# Patient Record
Sex: Male | Born: 1935 | Race: White | Hispanic: No | Marital: Married | Smoking: Former smoker
Health system: Southern US, Community
[De-identification: ages and names within clinical notes are randomized; demographics above are authoritative.]

## PROBLEM LIST (undated history)

## (undated) DIAGNOSIS — I2510 Atherosclerotic heart disease of native coronary artery without angina pectoris: Secondary | ICD-10-CM

## (undated) DIAGNOSIS — J4489 Other specified chronic obstructive pulmonary disease: Secondary | ICD-10-CM

## (undated) DIAGNOSIS — N200 Calculus of kidney: Secondary | ICD-10-CM

## (undated) DIAGNOSIS — R259 Unspecified abnormal involuntary movements: Secondary | ICD-10-CM

## (undated) DIAGNOSIS — J189 Pneumonia, unspecified organism: Secondary | ICD-10-CM

## (undated) DIAGNOSIS — J449 Chronic obstructive pulmonary disease, unspecified: Secondary | ICD-10-CM

## (undated) DIAGNOSIS — I872 Venous insufficiency (chronic) (peripheral): Secondary | ICD-10-CM

## (undated) DIAGNOSIS — G4733 Obstructive sleep apnea (adult) (pediatric): Secondary | ICD-10-CM

## (undated) HISTORY — DX: Calculus of kidney: N20.0

## (undated) HISTORY — DX: Unspecified abnormal involuntary movements: R25.9

## (undated) HISTORY — DX: Other specified chronic obstructive pulmonary disease: J44.89

## (undated) HISTORY — DX: Atherosclerotic heart disease of native coronary artery without angina pectoris: I25.10

## (undated) HISTORY — DX: Venous insufficiency (chronic) (peripheral): I87.2

## (undated) HISTORY — DX: Obstructive sleep apnea (adult) (pediatric): G47.33

## (undated) HISTORY — DX: Chronic obstructive pulmonary disease, unspecified: J44.9

---

## 2002-08-21 HISTORY — PX: CORONARY ARTERY BYPASS GRAFT: SHX141

## 2003-06-25 ENCOUNTER — Inpatient Hospital Stay (HOSPITAL_COMMUNITY): Admission: AD | Admit: 2003-06-25 | Discharge: 2003-07-06 | Payer: Self-pay | Admitting: Cardiology

## 2003-08-05 ENCOUNTER — Encounter: Admission: RE | Admit: 2003-08-05 | Discharge: 2003-08-05 | Payer: Self-pay | Admitting: Cardiothoracic Surgery

## 2005-02-23 ENCOUNTER — Ambulatory Visit: Payer: Self-pay | Admitting: Cardiology

## 2005-03-01 ENCOUNTER — Ambulatory Visit: Payer: Self-pay

## 2005-03-08 ENCOUNTER — Ambulatory Visit (HOSPITAL_COMMUNITY): Admission: RE | Admit: 2005-03-08 | Discharge: 2005-03-08 | Payer: Self-pay | Admitting: Neurology

## 2006-05-11 ENCOUNTER — Ambulatory Visit: Payer: Self-pay | Admitting: Cardiology

## 2007-05-01 ENCOUNTER — Ambulatory Visit: Payer: Self-pay | Admitting: Cardiology

## 2008-02-16 ENCOUNTER — Encounter: Admission: RE | Admit: 2008-02-16 | Discharge: 2008-02-16 | Payer: Self-pay | Admitting: Sports Medicine

## 2008-04-21 ENCOUNTER — Ambulatory Visit: Payer: Self-pay | Admitting: Cardiology

## 2009-05-06 DIAGNOSIS — G4733 Obstructive sleep apnea (adult) (pediatric): Secondary | ICD-10-CM | POA: Insufficient documentation

## 2009-05-06 DIAGNOSIS — Z87442 Personal history of urinary calculi: Secondary | ICD-10-CM | POA: Insufficient documentation

## 2009-05-06 DIAGNOSIS — Z951 Presence of aortocoronary bypass graft: Secondary | ICD-10-CM | POA: Insufficient documentation

## 2009-05-06 DIAGNOSIS — J449 Chronic obstructive pulmonary disease, unspecified: Secondary | ICD-10-CM | POA: Insufficient documentation

## 2009-05-06 DIAGNOSIS — J441 Chronic obstructive pulmonary disease with (acute) exacerbation: Secondary | ICD-10-CM | POA: Insufficient documentation

## 2009-05-06 DIAGNOSIS — E785 Hyperlipidemia, unspecified: Secondary | ICD-10-CM

## 2009-05-06 DIAGNOSIS — I872 Venous insufficiency (chronic) (peripheral): Secondary | ICD-10-CM | POA: Insufficient documentation

## 2009-05-06 DIAGNOSIS — I2510 Atherosclerotic heart disease of native coronary artery without angina pectoris: Secondary | ICD-10-CM | POA: Insufficient documentation

## 2009-05-07 ENCOUNTER — Ambulatory Visit: Payer: Self-pay | Admitting: Cardiology

## 2009-06-24 ENCOUNTER — Encounter: Payer: Self-pay | Admitting: Cardiology

## 2009-12-23 ENCOUNTER — Encounter: Payer: Self-pay | Admitting: Cardiology

## 2010-05-11 ENCOUNTER — Ambulatory Visit: Payer: Self-pay | Admitting: Cardiology

## 2010-06-28 ENCOUNTER — Encounter: Payer: Self-pay | Admitting: Cardiology

## 2010-09-20 NOTE — Assessment & Plan Note (Signed)
Summary: f1y  Medications Added POTASSIUM CHLORIDE CR 10 MEQ CR-CAPS (POTASSIUM CHLORIDE) Take one tablet by mouth daily FUROSEMIDE 20 MG TABS (FUROSEMIDE) Take one tablet by mouth daily. as needed PROPRANOLOL HCL 20 MG TABS (PROPRANOLOL HCL) Take 1 tablet by mouth three times a day      Allergies Added: NKDA  Visit Type:  1 year follow up Primary Provider:  Foye Deer, MD  CC:  No cardiac complains.  History of Present Illness: Mr. zilka is 77 years old and return for management of CAD. In 2004 he had bypass surgery. He has done quite well since then. He goes to the [facility] 4 days a week and works out. He's had no chest pain shortness of breath or palpitations.  His other problems include COPD, obstructive sleep apnea, hyperlipidemia, and venous insufficiency.  Current Medications (verified): 1)  Synthroid 125 Mcg Tabs (Levothyroxine Sodium) .... Take One Tablet By Mouth Once Daily. 2)  Simvastatin 80 Mg Tabs (Simvastatin) .... Take One Tablet By Mouth Daily At Bedtime 3)  Potassium Chloride Cr 10 Meq Cr-Caps (Potassium Chloride) .... Take One Tablet By Mouth Daily 4)  Furosemide 20 Mg Tabs (Furosemide) .... Take One Tablet By Mouth Daily. As Needed 5)  Propranolol Hcl 20 Mg Tabs (Propranolol Hcl) .... Take 1 Tablet By Mouth Three Times A Day 6)  Fa-Vitamin B-6-Vitamin B-12 2.2-25-0.5 Mg Tabs (Folic Acid-Vit B6-Vit B12) .... Take One Tablet By Mouth Once Daily. 7)  Aspirin Ec 325 Mg Tbec (Aspirin) .... Take One Tablet By Mouth Daily 8)  Meloxicam 15 Mg Tabs (Meloxicam) .... As Needed 9)  Lumigan 0.03 % Soln (Bimatoprost) .... Once Daily  Allergies (verified): No Known Drug Allergies  Past History:  Past Medical History: Reviewed history from 05/06/2009 and no changes required. NEPHROLITHIASIS, HX OF (ICD-V13.01) VENOUS INSUFFICIENCY (ICD-459.81) TREMOR.CHRONIC (ICD-781.0) HYPERLIPIDEMIA (ICD-272.4) COPD (ICD-496) SLEEP APNEA, OBSTRUCTIVE (ICD-327.23) CORONARY ARTERY  DISEASE (ICD-414.00) 1. Coronary artery disease status post coronary artery bypass graft     surgery in 2004 2. Normal left ventricular function. 3. Obstructive sleep apnea. 4. Chronic obstructive pulmonary disease. 5. Hyperlipidemia. 6. Chronic tremor. 7. Venous insufficiency of the lower extremities.    Review of Systems       ROS is negative except as outlined in HPI.   Vital Signs:  Patient profile:   77 year old male Height:      66 inches Weight:      219.50 pounds BMI:     35.56 Pulse rate:   63 / minute Pulse rhythm:   regular Resp:     18 per minute BP sitting:   132 / 80  (left arm) Cuff size:   large  Vitals Entered By: Vikki Ports (May 11, 2010 11:26 AM)  Physical Exam  Additional Exam:  Gen. Well-nourished, in no distress   Neck: No JVD, thyroid not enlarged, no carotid bruits Lungs: No tachypnea, clear without rales, rhonchi or wheezes Cardiovascular: Rhythm regular, PMI not displaced,  heart sounds  normal, no murmurs or gallops, no peripheral edema, pulses normal in all 4 extremities. Abdomen: BS normal, abdomen soft and non-tender without masses or organomegaly, no hepatosplenomegaly. MS: No deformities, no cyanosis or clubbing   Neuro:  No focal sns   Skin:  no lesions    Impression & Recommendations:  Problem # 1:  CORONARY ARTERY BYPASS GRAFT, HX OF (ICD-V45.81) He has had previous bypass surgery. He has had no chest pain this problem appears stable. His ECG today showed sinus  rhythm and lateral T-wave changes.  Problem # 2:  HYPERLIPIDEMIA (ICD-272.4)  His updated medication list for this problem includes:    Simvastatin 80 Mg Tabs (Simvastatin) .Marland Kitchen... Take one tablet by mouth daily at bedtime  Problem # 3:  COPD (ICD-496) This problem appears stable.  Patient Instructions: 1)  Your physician recommends that you continue on your current medications as directed. Please refer to the Current Medication list given to you today. 2)  Your  physician wants you to follow-up in: 1 year Dr. Clifton James.  You will receive a reminder letter in the mail two months in advance. If you don't receive a letter, please call our office to schedule the follow-up appointment.

## 2011-01-03 NOTE — Assessment & Plan Note (Signed)
[facility]                            CARDIOLOGY OFFICE NOTE   Billy Smith, Billy Smith                        MRN:          [id number]  DATE:05/01/2007                            DOB:          06/02/1933    PRIMARY CARE PHYSICIAN:  Dr. Barney Drain, [place], [place].   CLINICAL HISTORY:  Billy Smith is 77 years old and had 3-vessel coronary  artery bypass graft surgery in 2004.  He has done well since that time.  He is working out at [employer] most every day with rowing machines and  other aerobic activities.  He says he has had no chest pain,  palpitations, or shortness of breath.   PAST MEDICAL HISTORY:  1. Chronic tremor for which he is treated with propranolol.  2. Obstructive sleep apnea.  3. Chronic obstructive pulmonary disease.  4. History of venous insufficiency of lower extremities.   CURRENT MEDICATIONS:  Zocor, furosemide, potassium, Synthroid,  propranolol, aspirin, and primidone.   PHYSICAL EXAMINATION:  VITAL SIGNS:  Blood pressure 136/82, pulse 71 and  regular.  NECK:  There was no vein distension.  The carotid pulses were full  without bruits.  CHEST:  Clear.  CARDIAC:  Rhythm was regular.  No murmurs or gallops.  ABDOMEN:  Soft.  No organomegaly.  EXTREMITIES:  Peripheral pulses were full, and there was trace  peripheral edema.   An electrocardiogram showed lateral T wave changes which have not  changed.   IMPRESSION:  1. Coronary artery disease status post coronary artery bypass graft      surgery in 2004, stable.  2. Normal left ventricular function.  3. Obstructive sleep apnea.  4. Chronic obstructive pulmonary disease.  5. Hyperlipidemia.  6. Chronic tremor.  7. Venous insufficiency of the lower extremities.   RECOMMENDATIONS:  I think Billy Smith is doing well.  He would like to cut  back on his Lasix, and I think we can do this.  We will cut him back  from 80 to 40, and I told him that he could take it after he  [facility]es to the  [facility] if that interrupts his [facility] activities.  We will plan to see him back in  followup in a year.   ADDENDUM:  His wife asked about peripheral vascular disease, and I had  difficulty feeling Billy Smith pulses today and will check Billy Smith chart and decide  if we should consider anything more.     Bruce Elvera Lennox Juanda Chance, MD, [facility]  Electronically Signed    BRB/MedQ  DD: 05/01/2007  DT: 05/02/2007  Job #: [id number]   cc:   Barney Drain, M.D.

## 2011-01-03 NOTE — Assessment & Plan Note (Signed)
[facility]                            CARDIOLOGY OFFICE NOTE   Billy, Smith                        MRN:          [id number]  DATE:04/21/2008                            DOB:          [birth year]    PRIMARY CARE PHYSICIAN:  Billy Deer, MD   CLINICAL HISTORY:  Billy Smith is 77 years old and he returned for  followup management of his coronary heart disease.  He had coronary  artery bypass surgery in 2004.  He has done quite well since that time  and has had no recent chest pain, shortness of breath, or palpitations.  He works out at [employer] frequently, although he has had some lumbar  disk problems, which have slowed him up recently.   His past medical history is significant for chronic tremor, obstructive  sleep apnea, COPD, and venous insufficiency of the lower extremities.   Current medications include simvastatin, potassium, Synthroid,  propranolol, primidone, aspirin, and furosemide 20 mg b.i.d.   On examination, blood pressure 121/73 and pulse 63 and regular.  There  was no venous distention.  The carotid pulses were full without bruits.  Chest was clear.  Cardiac rhythm was regular.  There were no murmurs,  rubs, or gallops.  The abdomen was soft with normal bowel sounds.  Peripheral pulses were full with trace peripheral edema.   Electrocardiogram showed nonspecific ST-T changes.  There is borderline  left axis deviation.   IMPRESSION:  1. Coronary artery disease status post coronary artery bypass graft      surgery in 2004, stable.  2. Normal left ventricular function.  3. Obstructive sleep apnea.  4. Chronic obstructive pulmonary disease.  5. Hyperlipidemia.  6. Chronic tremor.  7. Venous insufficiency of the lower extremities.   RECOMMENDATIONS:  I think Billy Smith is doing quite well.  He has lost 23  pounds over the last couple of years and he has targeted 200 pounds.  I  encouraged him to continue with that.  He is  scheduled to have labs with  Dr. Tomasa Smith in the near future, and I have asked him to send  me a copy.  We will make not any changes in his therapy and we will plan  to see him back in followup in a year.     Bruce Elvera Lennox Juanda Chance, MD, [facility]  Electronically Signed    BRB/MedQ  DD: 04/21/2008  DT: 04/22/2008  Job #: [id number]   cc:   Billy Deer, MD

## 2011-01-06 NOTE — Assessment & Plan Note (Signed)
[facility]                              CARDIOLOGY OFFICE NOTE   Billy, Smith                        MRN:          [id number]  DATE:05/11/2006                            DOB:          [birth year]    PRIMARY CARE PHYSICIAN:  Dr. Barney Smith in [place].   CLINICAL HISTORY:  Mr. Billy Smith is 77 years old and had bypass surgery for 3  vessel coronary disease in 2004.  He had somewhat of a slow recovery due to  some comorbid conditions but subsequently has done quite well. He says he  has had no chest pain, shortness of breath or palpitations.  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  His family says he is somewhat reluctant to exert  himself for long walks.  His daughter also said that he sweats frequently  with daily activities.   His past medical history is significant for chronic tremor for which he is  being treated with propranolol.  He also has obstructive sleep apnea and  chronic obstructive pulmonary disease.  He also has venous insufficiency in  the lower extremities.   REVIEW OF SYSTEMS:  Positive in that he has had continued pain at the venous  harvest sites in his left leg.   CURRENT MEDICATIONS:  Include:  Zocor, furosemide, potassium, Synthroid,  propranolol, primidone.   On examination the blood pressure was 138/80 and the pulse 63 and regular.  There was no venous distention.  The carotid pulses were full without  bruits.  CHEST:  Clear.  CARDIAC:  Rhythm was regular.  He had no murmurs or gallops.  ABDOMEN:  Soft. No organomegaly  Bowel sounds were normal.  Peripheral pulses were full.  There is trace edema bilaterally.   An electrocardiogram showed incomplete right bundle branch block and  nonspecific ST-T changes with some slight lateral T-wave inversions.   IMPRESSION:  1. Coronary artery disease status post coronary bypass graft in 2004,  stable.  2. Obstructive sleep apnea.  3. Hyperlipidemia.  4. Chronic tremor.  5. Diaphoretic spells, uncertain etiology.   RECOMMENDATIONS:  I think Mr. Billy Smith is doing quite well from the standpoint  of his heart.  He is scheduled to see Dr. Tomasa Smith next week who plans to  check his cholesterol and thyroid. His thyroid functions will be of concern  because the recent sweating spells.  I asked the Billy Smith  to ask Dr. Tomasa Smith to send me copies of both of those results.  I did not  make any changes in his medications.  We will see him back in followup in a  year.                               Bruce Elvera Lennox Juanda Chance, MD, [facility]    BRB/MedQ  DD:  05/11/2006  DT:  05/14/2006  Job #:  [id number]

## 2011-01-06 NOTE — H&P (Signed)
NAME:  Billy Smith, Billy Smith                           ACCOUNT NO.:  [id number]   MEDICAL RECORD NO.:  [id number]                   PATIENT TYPE:  OIB   LOCATION:  2869                                 FACILITY:  [facility]   PHYSICIAN:  Charlies Constable, M.D.                  DATE OF BIRTH:  1933-08-05   DATE OF ADMISSION:  06/23/2003  DATE OF DISCHARGE:                                HISTORY & PHYSICAL   CHIEF COMPLAINT:  Dyspnea on exertion and abnormal Cardiolite.   HISTORY OF PRESENT ILLNESS:  Mr. Fluke is a 77 year old male with no known  history of coronary artery disease.  He was diagnosed with congestive heart  failure by his primary care physician, Dr. Tomasa Blase in [place], [place].  Because of this he had a Cardiolite scheduled and this showed EKG  changes and shortness of breath.  The Cardiolite images showed septal and  anterior ischemia with an EF of 59%.  Mr. Beaumier was refereed for  catheterization.   Mr. Ferrick never gets chest pain. He is not generally [place] of breath at  rest, but has significant dyspnea on exertion that has been getting worse  according to he and his wife.  He does continue to go to the [facility] at times  and when he is there he will work on a rowing machine, and a stationery  bicycle but does not actually walk very much because he gets shortness of  breath.  Of note, Mr. Loving is also undergoing neurologic evaluation for a  tremor and his wife states that she feels he has had some personality  changes as well.   PAST MEDICAL HISTORY:  Past medical history is significant for possible  hypertension, history of dyslipidemia and obesity.  He is a smoker, quit  recently.  He has a history of hypothyroidism and sleep apnea.  He also has  a history of nephrolithiasis.  He has a recently diagnosed tremor.   PAST SURGICAL HISTORY:  None.   SOCIAL HISTORY:  [place]  He has  an approximately 50-pack-year history of tobacco  use but quit about 4 years  ago.  He does not abuse alcohol.  He does not use any drugs.   FAMILY HISTORY:  His mother is alive at age 69, but had an MI earlier in  life.  His father died at age 66 of prostate cancer.  One brother has  possible coronary artery disease, but this is not known.   ALLERGIES:  No known drug allergies.   MEDICATIONS:  1. Synthroid 75 mg daily.  2. Lasix 40 mg daily.  3. Klorcon 20 mEq daily.  4. Aspirin 81 mg daily.  5. Primidone 250 mg b.i.d.   REVIEW OF SYSTEMS:  Significant for obstructive sleep apnea for which he  uses CPAP and he gets some relief from that.  He denies any hematemesis,  hemoptysis, or melena.  He has not had any significant illness recently and  denies any fevers or chills.  There is no history of seizures and no syncope  or presyncope. He denies claudication symptoms.   PHYSICAL EXAMINATION:  VITAL SIGNS:  Temperature is 97.3, blood pressure  139/76, heart rate 79, respiratory rate 20.  GENERAL:  He is a well-developed, obese, white male in no acute distress.  HEENT:  His head is normocephalic and atraumatic with pupils are equal,  round, and reacted to light and accommodation.  Extraocular movements are  intact.  Nares are without discharge.  NECK:  There is no JVD noted.  No thyromegaly noted.  No carotid bruits are  appreciated.  CHEST:  Clear to auscultation bilaterally.  CARDIOVASCULAR:  His heart is regular in rate and rhythm with a 2/6 systolic  ejection murmur noted.  ABDOMEN:  Abdomen is obese and firm, but nontender, but no  hepatosplenomegaly is appreciated by percussion.  EXTREMITIES:  Without significant edema.  Distal pulses are 2+ bilaterally  in all 4 extremities.  No femoral bruits are appreciated.  NEUROLOGIC:  He is alert and oriented with no focal deficits noted.   EKG:  Sinus rhythm with T wave changes in leads 1, AVL, as well as V2  through V6.  No old exam available.   LABORATORY VALUES:  Hemoglobin  16.1, hematocrit 45.7, WBC 6.9, platelets  171,000.  Sodium 139, potassium 5.2, chloride 102, CO2 24, BUN 14,  creatinine 0.9, glucose 83.  INR 1.0 with a PTT of 33.  TSH 0.034.   Chest x-ray no significant acute abnormality.  Cardiolite:  Standard Bruce  protocol was stopped at 1-1/2 minutes into stage 2 because of dyspnea and  fatigue.  __________ images showed an EF of 59% with septal and anterior  wall ischemia and normal left ventricular wall motion.   ASSESSMENT AND PLAN:  1. Abnormal Cardiolite/dyspnea on exertion, catheterization today.  2. Other medical problems including hypothyroidism, obstructive sleep apnea     and tremors will be followed by his primary cardiologist, primary care     physician and urologist in [place], [place].      Lavella Hammock, P.A. [facility]                  Charlies Constable, M.D.    RG/MEDQ  D:  06/23/2003  T:  06/23/2003  Job:  [id number]   cc:   Aundra Dubin. Revankar, M.D.  [address]  [place]  [place] [address]  Fax: [phone]   Tomasa Blase, M.S.  [place], [place]

## 2011-01-06 NOTE — Op Note (Signed)
NAME:  Billy Smith, Billy Smith                           ACCOUNT NO.:  [id number]   MEDICAL RECORD NO.:  [id number]                   PATIENT TYPE:  INP   LOCATION:  2310                                 FACILITY:  [facility]   PHYSICIAN:  Kerin Perna III, M.D.           DATE OF BIRTH:  02-14-1933   DATE OF PROCEDURE:  06/25/2003  DATE OF DISCHARGE:                                 OPERATIVE REPORT   PREOPERATIVE DIAGNOSIS:  Class 4 progressive angina with severe left main  and three-vessel coronary artery disease.   POSTOPERATIVE DIAGNOSIS:  Class 4 progressive angina with severe left main  and three-vessel coronary artery disease.   OPERATION PERFORMED:  Coronary artery bypass grafting times four (left  internal mammary artery to left anterior descending, saphenous vein graft to  intermediate, saphenous vein graft to circumflex marginal, saphenous vein  graft to posterior descending).   SURGEON:  Kerin Perna, M.D.   ASSISTANT:  1. Salvatore Decent. Dorris Fetch, M.D.  2. Rowe Clack, P.A.-C.   ANESTHESIA:  General.   ANESTHESIOLOGIST:  Maren Beach, M.D.   INDICATIONS FOR PROCEDURE:  The patient was a 77 year old obese male with  dyspnea on exertion.  A stress test was performed in [place] which was  positive for anterior ischemia.  Cardiac catheterization by Dr. Charlies Constable  demonstrated a chronically occluded LAD, 70% left main stenosis, 70 to 80%  stenosis of the midright coronary, and faint collateralization of the distal  LAD.  Ejection fraction was 50 to 55%.  He was felt to be a candidate for  surgical coronary revascularization.  His other medical problems included  obesity, sleep apnea, chronic smoking history, and tremor disorder.   Prior to surgery I examined the patient in his hospital room and reviewed  the results of the cardiac catheterization with the patient and his family.  I discussed the indications and expected benefits of coronary artery bypass  surgery for  treatment of his coronary artery disease.  I discussed the  alternatives to surgical therapy for treatment of his coronary artery  disease and the expected outcome of those alternative therapies.  I reviewed  with the patient and family, the major aspects of the proposed procedure  including the choice of conduit for grafting, the location of the surgical  incisions, the use of general anesthesia and cardiopulmonary bypass, and the  expected postoperative hospital recovery.  I discussed with the patient  risks to him of coronary artery bypass surgery including risks of MI, CVA,  bleeding, blood transfusion requirement, infection, and death.  He  understood that due to his chronic lung disease, he would be at increase for  postoperative pulmonary problems including pneumonia, atelectasis, oxygen  dependence, and ventilator dependence. He understood these implications for  the surgery and agreed to proceed with the operation as planned under what I  felt was an informed consent.   OPERATIVE FINDINGS:  The patient's body habitus make exposure of the back of  the heart difficult.  His aorta was foreshortened.  The vein was harvested  from the right leg using endoscopic technique for the thigh as well as the  upper part of the distal leg.  The lower leg vein on the right was not  adequate for use and a separate piece was taken from the left ankle.  The  mammary artery was a small vessel but with excellent flow.  Coronaries were  all small and other than the posterior descending they were suboptimal  targets for grafting.  The circumflex marginal was intramyocardial.  The  patient was given a unit of platelets following administration of protamine  for evidence of coagulation disfunction.   DESCRIPTION OF PROCEDURE:  The patient was brought to the operating room and  placed supine on the operating table where general anesthesia was induced  under invasive hemodynamic monitoring.  The chest,  abdomen and legs were  prepped with Betadine and draped as a sterile field.  A sternal incision was  made as the saphenous vein was harvested from the lower legs. The left  internal mammary artery pedicle was harvested as a pedicle graft from its  origin at the subclavian vessels.  Heparin was administered.  The ACT was  documented as being therapeutic.  The sternal retractor was placed.  Through  pursestrings placed in the ascending aorta and right atrium, the patient was  cannulated and placed on bypass.  The coronaries were identified for  grafting.  The mammary artery and vein grafts were prepared for the distal  anastomoses.  Cardioplegia cannulas were placed for both antegrade aortic  and retrograde coronary sinus cardioplegia.  The aortic cross-clamp was  applied.  A total of of cold blood cardioplegia was delivered in split  doses between the aortic and retrograde coronary sinus catheters.  There was  a good cardioplegic arrest and septal temperature dropped less than 14  degrees.  Topical iced saline was used to augment myocardial preservation  and a pericardial insulator pad was used to protect the left phrenic nerve.   The distal coronary anastomoses were then performed.  The first distal  anastomosis was to the posterior descending.  This was a 1.5 mm vessel with  possible 80% stenosis. A reversed saphenous vein was sewn end-to-side with  running 7-0 Prolene with good flow through the graft.  A second distal  anastomosis was to the circumflex marginal.  This was a 1.5 mm vessel with  proximal 70% stenosis.  It was intramyocardial.  A reversed saphenous vein  was sewn end-to-side with running 7-0 Prolene with good flow through the  graft.  Cardioplegia was redosed.  The third distal anastomosis was to a  small ramus intermediate which was occluded proximally.  It was 1 mm.  A  reversed saphenous vein was sewn end-to-side to running 7-0 Prolene and there was adequate flow  through the graft.  The fourth distal anastomosis  was placed to the distal third of the LAD. The LAD was heavily and diffusely  diseased.  It was occluded proximally. The left internal mammary artery  pedicle was brought through an opening created in the left lateral  pericardium, was brought down onto the LAD and sewn end-to-side with running  8-0 Prolene.  There was excellent flow through the anastomosis with  immediate rise in septal temperature after release of the vascular pedicle  clamp on the mammary artery.  The mammary pedicle  was secured to the  epicardium and the aortic cross-clamp was removed.   The heart resumed a spontaneous rhythm.  Using a partial occluding clamp,  three proximal vein anastomoses were placed on the  ascending aorta using a 4.0 mm punch and running 6-0 Prolene.  The partial  clamp was removed and the vein grafts were perfused.  Each had good flow and  hemostasis was documented to the proximal and distal anastomoses.  The  patient was rewarmed and reperfused.  Temporary pacing wires were applied.  The lungs were re-expanded and the ventilator was resumed.  The patient was  then weaned from bypass without difficulty, without inotropes.  Blood  pressure and cardiac output were stable.  Protamine was administered without  adverse reaction.  The cannulas were removed.  The mediastinum was irrigated  with warm antibiotic irrigation.  The leg incision was irrigated and closed  in a standard fashion.  The pericardium was loosely reapproximated  superiorly.  Two mediastinal and a left pleural chest tube were placed and  brought out through separate incisions.  The sternum was closed with  interrupted steel  wire.  Reinforced double gauged wire was used for the central part of the  sternum.  The pectoralis fascia was closed with a running #1 Vicryl.  The  subcutaneous tissue and skin were closed with a running Vicryl and sterile  dressings were applied.  Total  bypass time was 165 minutes with a cross-  clamp time of 60 minutes.                                               Mikey Bussing, M.D.    PV/MEDQ  D:  06/25/2003  T:  06/26/2003  Job:  [id number]   cc:   CVTS office   [facility]   [facility]

## 2011-01-06 NOTE — Discharge Summary (Signed)
NAME:  Billy Smith, Billy Smith                           ACCOUNT NO.:  [id number]   MEDICAL RECORD NO.:  [id number]                   PATIENT TYPE:  INP   LOCATION:                                       FACILITY:  [facility]   PHYSICIAN:  Rowe Clack, P.A.-C.              DATE OF BIRTH:  Apr 17, 1933   DATE OF ADMISSION:  06/25/2003  DATE OF DISCHARGE:  07/06/2003                                 DISCHARGE SUMMARY   HISTORY OF PRESENT ILLNESS:  This is a 77 year old male with no known  history of coronary artery disease.  He was diagnosed with congestive heart  failure by his primary physician Dr. Tomasa Blase in [place], [place].  Because of this he had a Cardiolite scheduled and this showed EKG changes  and shortness of breath.  The Cardiolite images showed septal and anterior  ischemia with an ejection fraction 59%.  Billy Smith was referred for cardiac  catheterization.   PAST MEDICAL HISTORY:  1. Possible hypertension.  2. Dyslipidemia.  3. Obesity.  4. Tobacco abuse.  He quit approximately one month ago.  5. History of hypothyroidism.  6. History of sleep apnea.  7. History of nephrolithiasis.  8. History of tremor.   PAST SURGICAL HISTORY:  None.   SOCIAL HISTORY:  [place]  He has  approximately 50-pack-year history of tobacco, but quit.  He does not use  alcohol.  He does not use drugs.   FAMILY HISTORY:  Please see the history and physical done at the time of  admission.   REVIEW OF SYSTEMS:  Please see the history and physical done at the time of  admission.   ALLERGIES:  No known drug allergies.   MEDICATIONS ON ADMISSION:  1. Synthroid 75 mcg daily.  2. Lasix 40 mg daily.  3. Klor-Con 20 mg daily.  4. Aspirin 81 mg daily.  5. Primidone 250 mg b.i.d.   HOSPITAL COURSE:  The patient was admitted for scheduled cardiac  catheterization.  This was performed on June 23, 2003 by Charlies Constable,  M.D. where he was found to have  the following lesions:  A 50-70% distal left  main lesion, a 50-70% ostial left circumflex lesion, a total proximal LAD  lesion, a 70% proximal RCA lesion.  Left ventriculogram showed that the apex  tip was akinetic.  Ejection fraction approximately 60%.  Charlies Constable, M.D.  recommended coronary artery bypass grafting and he was subsequently referred  to Kerin Perna, M.D. who evaluated his studies and examined the patient  and felt that he would require preoperative pulmonary function tests before  proceeding with surgery.  These were done but the exact results are not  available for the chart.  Kerin Perna, M.D. did review this study and he  felt they were acceptable for surgery.  He also had a preoperative modified  barium swallow which was acceptable.  His laboratory values were felt to be  acceptable and he was subsequently scheduled for the surgery.   PROCEDURE:  Coronary artery bypass grafting x4.  The following grafts were  placed:  Left internal mammary artery to the LAD, saphenous vein graft to  the obtuse marginal, saphenous vein graft to the ramus, saphenous vein graft  to the posterior descending.  Cross clamp time was 60 minutes.  Pump time  165 minutes.  The patient tolerated procedure well.  Was taken to the  surgical intensive care unit in stable condition.   POSTOPERATIVE HOSPITAL COURSE:  The patient has done quite well.  He has had  a somewhat slow response due to his overall pre procedure deconditioning.  He is felt to be responding well in regards to cardiac rehabilitation phase  I modalities.  Some home care arrangements have been made for occupational  and physical therapy at time of discharge.  His laboratory values are felt  to be stable.  He does have a mild postoperative anemia.  His most recent  hemoglobin and hematocrit dated June 28, 2003 9.5 and 27.5, respectively.  His incisions are healing well without signs of infection.  He did develop a   right hand phlebitis from an IV site and is currently on Keflex which will  be continued at home.  Oxygen has been weaned and he maintains good  saturations on room air.  He has required aggressive pulmonary toilet and  nebulizers, but has responded well and has no clinical wheeze.  He did have  postoperative atrial fibrillation and initially was treated with beta  blockers and subsequent amiodarone.  However, he developed a rash with the  amiodarone and this was discontinued.  He was subsequently started on  Cardizem for assistance with rate control and he has maintained a normal  sinus rhythm for the past few days with no further recurrence of his atrial  fibrillation.  The patient's overall status is that he is felt to be  tentatively stable for discharge in the morning of July 05, 2003 pending  reevaluation.   DISCHARGE MEDICATIONS:  1. Keflex 500 mg q.8h. for seven days.  2. Lasix 80 mg daily.  3. Aspirin 325 mg daily.  4. Synthroid 75 mcg daily.  5. Lopressor 25 mg b.i.d.  6. K-Dur 20 mEq b.i.d.  7. Cardizem CD 120 mg daily.  8. Combivent metered dose inhaler two puffs q.i.d.  9. For pain Tylox one to two q.4-6h. as needed.   DISCHARGE INSTRUCTIONS:  The patient will receive written instructions in  regard to medications, activity, diet, wound care, and follow-up.  Follow-up  will include Rajan R. Revankar, M.D. in two weeks.  He needs to call the  office to arrange that.  Additionally, he will see Kerin Perna, M.D. on  December 15 at 4 p.m. with a chest x-ray from [facility].   FINAL DIAGNOSES:  1. Severe multivessel coronary artery disease.  2. Postoperative anemia.  3. Postoperative fluid overload, improving.  4. Postoperative right hand phlebitis, improving.  5. Hypothyroidism.  6. Hypertension.  7. Dyslipidemia.  8. History of tobacco abuse and chronic obstructive pulmonary disease.  9. History of sleep apnea. 10.      History of nephrolithiasis.  11.      History  of tremor, possibly essential.   ADDENDUM:  I failed to mention that the patient continues on his primidone  250 mg b.i.d.  Rowe Clack, P.A.-C.    Sherryll Burger  D:  07/03/2003  T:  07/04/2003  Job:  [id number]   cc:   Charlies Constable, M.D.   Aundra Dubin. Revankar, M.D.  [address]  [place]  [place] [address]  Fax: [phone]   M.S. Veatrice Kells, M.D.  [place]

## 2011-01-06 NOTE — Cardiovascular Report (Signed)
NAME:  Billy Smith, Billy Smith                           ACCOUNT NO.:  [id number]   MEDICAL RECORD NO.:  [id number]                   PATIENT TYPE:  OIB   LOCATION:  2869                                 FACILITY:  [facility]   PHYSICIAN:  Charlies Constable, M.D.                  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  He  recently has had symptoms of shortness of breath and with exertion and  underwent a Cardiolite scan which showed septal and anterior ischemia with  good ejection fraction of 59%.  For this reason he was scheduled for a  cardiac catheterization.  He also has hyperlipidemia, obesity, and previous  smoking, although he quit recently.  Also has a history of sleep apnea and  tremors.   PROCEDURE:  The procedure was performed via the right femoral artery.  We  had a great deal of difficulty feeling the femoral artery and accessed the  vein first, then were able to access the artery.  A front wall arterial  puncture was performed and Omnipaque contrast was used.  The right heart  catheterization was performed via the right femoral vein using a medium  sheath and Swan-Ganz thermodilution catheter.  A subclavian injection was  performed to assess the internal mammary artery for its suitability for  bypass grafting.  A distal aortogram was performed to rule out abdominal  aortic aneurysm.  The patient tolerated the procedure well and left the  laboratory in satisfactory condition.   HEMODYNAMIC DATA:  Right atrial pressure was 15 mean.  The right ventricular  pressure was 31/18.  Pulmonary artery pressure was 31/17 with a mean of 24.  Pulmonary wedge pressure was 14 mean.  Left ventricular pressure was 109/19.  The aortic pressure was 109/57 with a mean of 77.   RESULTS:  Left main coronary artery:  Had a 50 to questionable 70%  narrowing  in its distal portion.   Left anterior descending artery:  Completely occluded at the first diagonal  branch and first septal perforator.  Part of the mid LAD filled via  collaterals from the left coronary system.   Circumflex artery:  Had a marked 180 degree bend into the vessel.  There was  50 to questionable 70% ostial stenosis in this vessel.  This vessel gave  rise to a marginal branch and an AV branch which terminated in  posterolateral branch.  Both these branches were free of significant  disease.   Right coronary artery:  Large vessel.  Gave rise to two right ventricular  branches, a posterior descending branch, and a posterolateral branch.  There  was 70% proximal stenosis in the right coronary artery and 50% stenosis in  the distal right coronary artery.  LEFT VENTRICULOGRAM:  The left ventriculogram performed in the RAO  projection showed akinesis of the very tip of the apex.  The rest of the  wall motion was quite vigorous and the estimated ejection fraction was 60%.   DISTAL AORTOGRAM:  A distal aortogram was performed which showed patent  renal arteries and no significant aortoiliac obstruction.   CONCLUSIONS:  Coronary artery disease with 50-70% narrowing in the left main  coronary artery, 50-70% ostial stenosis in the circumflex artery, total  occlusion of the left anterior descending artery, 70% proximal and 50%  distal stenosis in the right coronary artery with a small area of apical  akinesis and an ejection fraction of 60%.   RECOMMENDATIONS:  The patient has severe disease in the left system and I  think the best option is bypass surgery.  Will obtain surgical consultation.  I spoke with Billy Smith, M.D. about these findings.                                               Charlies Constable, M.D.    BB/MEDQ  D:  06/23/2003  T:  06/23/2003  Job:  [id number]   cc:   Billy Dubin. Tomie China, M.D.  [address]  [place]  [place] [address]  Fax:  [phone]   Merrily Pew, M.D.   [facility]

## 2012-05-15 ENCOUNTER — Encounter: Payer: Self-pay | Admitting: Cardiology

## 2012-06-18 ENCOUNTER — Encounter: Payer: Self-pay | Admitting: Cardiovascular Disease

## 2012-06-18 ENCOUNTER — Ambulatory Visit (INDEPENDENT_AMBULATORY_CARE_PROVIDER_SITE_OTHER): Payer: Medicare Other | Admitting: Cardiovascular Disease

## 2012-06-18 VITALS — BP 142/76 | HR 59 | Ht 64.0 in | Wt 225.0 lb

## 2012-06-18 DIAGNOSIS — I2510 Atherosclerotic heart disease of native coronary artery without angina pectoris: Secondary | ICD-10-CM

## 2012-06-18 NOTE — Patient Instructions (Signed)

## 2012-06-18 NOTE — Progress Notes (Signed)
History of Present Illness: 78 yo white male with history of CAD, COPD, OSA, HLD, venous insufficiency who is here today for cardiac follow up. He has been followed in the past by Dr. Juanda Chance and has not been seen in our office since September 2011. In 2004 he had bypass surgery (LIMA to LAD, SVG to intermediate, SVG to circumflex marginal, SVG to PDA). He has done quite well since then.   He is here today for follow up. No chest pain,  shortness of breath or palpitations. Overall doing well.   Primary Care Physician: Foye Deer  Fasting Lipids: Followed in primary care.   Past Medical History  Diagnosis Date  . Nephrolithiasis   . Venous insufficiency   . Abnormal involuntary movements   . Chronic airway obstruction, not elsewhere classified   . Obstructive sleep apnea (adult) (pediatric)   . CAD (coronary artery disease)     with 4V CABG 2004    Past Surgical History  Procedure Date  . Coronary artery bypass graft 2004    Current Outpatient Prescriptions  Medication Sig Dispense Refill  . aspirin 325 MG tablet Take 325 mg by mouth daily.      . bimatoprost (LUMIGAN) 0.03 % ophthalmic solution 1 drop at bedtime.      . Folic Acid-Vit B6-Vit B12 (FOLBEE) 2.5-25-1 MG TABS Take 1 tablet by mouth daily.      . furosemide (LASIX) 20 MG tablet Take 20 mg by mouth as needed.      Marland Kitchen levothyroxine (SYNTHROID, LEVOTHROID) 125 MCG tablet Take 125 mcg by mouth daily.      . meloxicam (MOBIC) 15 MG tablet Take 15 mg by mouth daily.      . potassium chloride (K-DUR,KLOR-CON) 10 MEQ tablet Take 10 mEq by mouth 2 (two) times daily.       . primidone (MYSOLINE) 250 MG tablet Take 1/2 tab three times a day      . propranolol (INDERAL) 20 MG tablet Take 20 mg by mouth 3 (three) times daily.      . simvastatin (ZOCOR) 40 MG tablet Take 40 mg by mouth every evening.        No Known Allergies  History   Social History  . Marital Status: Married    Spouse Name: N/A    Number of  Children: 1  . Years of Education: N/A   Occupational History  . Retired-welder    Social History Main Topics  . Smoking status: Former Smoker -- 0.5 packs/day for 20 years    Types: Cigarettes    Quit date: 08/21/2002  . Smokeless tobacco: Not on file   Comment: pt stopped smoking after his bypass  . Alcohol Use: No  . Drug Use: No  . Sexually Active: Not on file   Other Topics Concern  . Not on file   Social History Narrative  . No narrative on file    Family History  Problem Relation Age of Onset  . CAD Brother   . Valvular heart disease Brother     Review of Systems:  As stated in the HPI and otherwise negative.   BP 142/76  Pulse 59  Ht 5\' 4"  (1.626 m)  Wt 225 lb (102.059 kg)  BMI 38.62 kg/m2  Physical Examination: General: Well developed, well nourished, NAD HEENT: OP clear, mucus membranes moist SKIN: warm, dry. No rashes. Neuro: No focal deficits Musculoskeletal: Muscle strength 5/5 all ext Psychiatric: Mood and affect normal Neck: No JVD, no  carotid bruits, no thyromegaly, no lymphadenopathy. Lungs:Clear bilaterally, no wheezes, rhonci, crackles Cardiovascular: Regular rate and rhythm. No murmurs, gallops or rubs. Abdomen:Soft. Bowel sounds present. Non-tender.  Extremities: No lower extremity edema. Pulses are 2 + in the bilateral DP/PT.  EKG: Sinus brady, rate 59 bpm. Incomplete RBBB. LVH. Non-specific T wave abnormality with lateral ST depression. TWI anterolateral leads, unchanged.   Assessment and Plan:   1. CAD: s/p CABG in 2004. No recent chest pain, SOB. He is on good medical therapy. BP has not been an issue but it is slightly elevated today. He will buy a blood pressure cuff. His lipids are well controlled in primary care. He will bring those records over. Will arrange echo to assess LVEF.

## 2012-06-26 ENCOUNTER — Other Ambulatory Visit: Payer: Self-pay

## 2012-06-26 ENCOUNTER — Ambulatory Visit (HOSPITAL_COMMUNITY): Payer: Medicare Other | Attending: Cardiovascular Disease | Admitting: Radiology

## 2012-06-26 DIAGNOSIS — I2510 Atherosclerotic heart disease of native coronary artery without angina pectoris: Secondary | ICD-10-CM | POA: Insufficient documentation

## 2012-06-26 DIAGNOSIS — J4489 Other specified chronic obstructive pulmonary disease: Secondary | ICD-10-CM | POA: Insufficient documentation

## 2012-06-26 DIAGNOSIS — I10 Essential (primary) hypertension: Secondary | ICD-10-CM | POA: Insufficient documentation

## 2012-06-26 DIAGNOSIS — J449 Chronic obstructive pulmonary disease, unspecified: Secondary | ICD-10-CM | POA: Insufficient documentation

## 2012-06-26 NOTE — Progress Notes (Signed)
Echocardiogram performed.  

## 2012-09-04 ENCOUNTER — Other Ambulatory Visit: Payer: Self-pay | Admitting: Neurology

## 2012-09-04 ENCOUNTER — Ambulatory Visit
Admission: RE | Admit: 2012-09-04 | Discharge: 2012-09-04 | Disposition: A | Discharge status: Home / Self Care | Payer: Medicare Other | Source: Ambulatory Visit | Attending: Neurology | Admitting: Neurology

## 2012-09-04 DIAGNOSIS — R269 Unspecified abnormalities of gait and mobility: Secondary | ICD-10-CM

## 2012-09-04 DIAGNOSIS — W19XXXA Unspecified fall, initial encounter: Secondary | ICD-10-CM

## 2012-12-19 ENCOUNTER — Ambulatory Visit: Payer: Medicare Other | Admitting: Cardiovascular Disease

## 2013-01-08 ENCOUNTER — Encounter: Payer: Self-pay | Admitting: Cardiovascular Disease

## 2013-01-30 ENCOUNTER — Ambulatory Visit (INDEPENDENT_AMBULATORY_CARE_PROVIDER_SITE_OTHER): Payer: Medicare Other | Admitting: Cardiovascular Disease

## 2013-01-30 ENCOUNTER — Encounter: Payer: Self-pay | Admitting: Cardiovascular Disease

## 2013-01-30 VITALS — BP 137/84 | HR 69 | Ht 63.0 in | Wt 227.8 lb

## 2013-01-30 DIAGNOSIS — I25810 Atherosclerosis of coronary artery bypass graft(s) without angina pectoris: Secondary | ICD-10-CM

## 2013-01-30 MED ORDER — FUROSEMIDE 20 MG PO TABS: 20.0000 mg | ORAL_TABLET | Freq: Every day | ORAL | Status: DC

## 2013-01-30 MED ORDER — SIMVASTATIN 20 MG PO TABS: 20.0000 mg | ORAL_TABLET | Freq: Every day | ORAL | Status: DC

## 2013-01-30 NOTE — Patient Instructions (Signed)
Your physician wants you to follow-up in: 6 months.   You will receive a reminder letter in the mail two months in advance. If you don't receive a letter, please call our office to schedule the follow-up appointment.  Your physician has recommended you make the following change in your medication: Take Lasix 20 mg by mouth daily. Resume simvastatin 20 mg by mouth daily at bedtime.   Have fasting lab work checked at your primary care doctor's office in 12 weeks.

## 2013-01-30 NOTE — Progress Notes (Signed)
History of Present Illness: 79 yo white male with history of CAD, COPD, OSA, HLD, venous insufficiency who is here today for cardiac follow up. He has been followed in the past by Dr. Juanda Chance. In 2004 he had bypass surgery (LIMA to LAD, SVG to intermediate, SVG to circumflex marginal, SVG to PDA). He has done quite well since then. He fell in December 2013 and was on the ground for 45 minutes. His family helped him up and called EMS. He did not go to the hospital. He was weak and was diagnosed with pneumonia. He has been slowly recovering since then.   He is here today for follow up. He has started back exercising at the [facility]. No chest pain, shortness of breath or palpitations. Overall doing well.   Primary Care Physician: Foye Deer   Fasting Lipids: Followed in primary care.    Past Medical History  Diagnosis Date  . Nephrolithiasis   . Venous insufficiency   . Abnormal involuntary movements(781.0)   . Chronic airway obstruction, not elsewhere classified   . Obstructive sleep apnea (adult) (pediatric)   . CAD (coronary artery disease)     with 4V CABG 2004    Past Surgical History  Procedure Laterality Date  . Coronary artery bypass graft  2004    Current Outpatient Prescriptions  Medication Sig Dispense Refill  . aspirin 325 MG tablet Take 325 mg by mouth daily.      . bimatoprost (LUMIGAN) 0.03 % ophthalmic solution 1 drop at bedtime.      . Folic Acid-Vit B6-Vit B12 (FOLBEE) 2.5-25-1 MG TABS Take 1 tablet by mouth daily.      . furosemide (LASIX) 20 MG tablet Take 20 mg by mouth as needed.      Marland Kitchen levothyroxine (SYNTHROID, LEVOTHROID) 125 MCG tablet Take 125 mcg by mouth daily.      . primidone (MYSOLINE) 250 MG tablet Take 1/2 tab three times a day      . propranolol (INDERAL) 20 MG tablet Take 20 mg by mouth 3 (three) times daily.       No current facility-administered medications for this visit.    No Known Allergies  History   Social History  . Marital  Status: Married    Spouse Name: N/A    Number of Children: 1  . Years of Education: N/A   Occupational History  . Retired-welder    Social History Main Topics  . Smoking status: Former Smoker -- 0.50 packs/day for 20 years    Types: Cigarettes    Quit date: 08/21/2002  . Smokeless tobacco: Not on file     Comment: pt stopped smoking after his bypass  . Alcohol Use: No  . Drug Use: No  . Sexually Active: Not on file   Other Topics Concern  . Not on file   Social History Narrative  . No narrative on file    Family History  Problem Relation Age of Onset  . CAD Brother   . Valvular heart disease Brother     Review of Systems:  As stated in the HPI and otherwise negative.   BP 137/84  Pulse 69  Ht 5\' 3"  (1.6 m)  Wt 227 lb 12.8 oz (103.329 kg)  BMI 40.36 kg/m2  Physical Examination: General: Well developed, well nourished, NAD HEENT: OP clear, mucus membranes moist SKIN: warm, dry. No rashes. Neuro: No focal deficits Musculoskeletal: Muscle strength 5/5 all ext Psychiatric: Mood and affect normal Neck: No JVD, no carotid  bruits, no thyromegaly, no lymphadenopathy. Lungs:Clear bilaterally, no wheezes, rhonci, crackles Cardiovascular: Regular rate and rhythm. No murmurs, gallops or rubs. Abdomen:Soft. Bowel sounds present. Non-tender.  Extremities: Trace lower extremity edema. Pulses are 2 + in the bilateral DP/PT.  Echo 06/26/12: Left ventricle: The cavity size was normal. Systolic function was normal. The estimated ejection fraction was in the range of 55% to 60%. Wall motion was normal; there were no regional wall motion abnormalities.  Assessment and Plan:   1. CAD: s/p CABG in 2004. No recent chest pain, SOB. He is on good medical therapy. He had rise in LFTs on higher dose of statin (Zocor was increased to 40 mg Qdaily). He has been off of the Zocor for a few months and LFTs are normalized. Will restart Zocor 20 mg po QHS. Will need repeat lipids/LFTs in 12  weeks.  Echo normal 11/13. BP controlled.

## 2013-03-05 ENCOUNTER — Ambulatory Visit: Payer: Self-pay | Admitting: Neurology

## 2013-07-10 ENCOUNTER — Encounter: Payer: Self-pay | Admitting: Cardiovascular Disease

## 2013-07-29 ENCOUNTER — Encounter: Payer: Self-pay | Admitting: Cardiovascular Disease

## 2013-07-29 ENCOUNTER — Ambulatory Visit (INDEPENDENT_AMBULATORY_CARE_PROVIDER_SITE_OTHER): Payer: Medicare Other | Admitting: Cardiovascular Disease

## 2013-07-29 VITALS — BP 130/78 | HR 54 | Ht 65.0 in | Wt 220.0 lb

## 2013-07-29 DIAGNOSIS — I2510 Atherosclerotic heart disease of native coronary artery without angina pectoris: Secondary | ICD-10-CM

## 2013-07-29 NOTE — Patient Instructions (Signed)
Your physician wants you to follow-up in:  6 months. You will receive a reminder letter in the mail two months in advance. If you don't receive a letter, please call our office to schedule the follow-up appointment.   

## 2013-07-29 NOTE — Progress Notes (Signed)
History of Present Illness: 79 yo white male with history of CAD, COPD, OSA, HLD, venous insufficiency who is here today for cardiac follow up. Billy Smith has been followed in the past by Dr. Juanda Chance. In 2004 Billy Smith had bypass surgery (LIMA to LAD, SVG to intermediate, SVG to circumflex marginal, SVG to PDA). Billy Smith has done quite well since then.    Billy Smith is here today for follow up. No chest pain, shortness of breath or palpitations. Overall doing well.   Primary Care Physician: Foye Deer   Fasting Lipids: Followed in primary care.    Past Medical History  Diagnosis Date  . Nephrolithiasis   . Venous insufficiency   . Abnormal involuntary movements(781.0)   . Chronic airway obstruction, not elsewhere classified   . Obstructive sleep apnea (adult) (pediatric)   . CAD (coronary artery disease)     with 4V CABG 2004    Past Surgical History  Procedure Laterality Date  . Coronary artery bypass graft  2004    Current Outpatient Prescriptions  Medication Sig Dispense Refill  . aspirin 325 MG tablet Take 325 mg by mouth daily.      . bimatoprost (LUMIGAN) 0.03 % ophthalmic solution 1 drop at bedtime.      . Folic Acid-Vit B6-Vit B12 (FOLBEE) 2.5-25-1 MG TABS Take 1 tablet by mouth daily.      . furosemide (LASIX) 20 MG tablet Take 1 tablet (20 mg total) by mouth daily.  30 tablet  11  . levothyroxine (SYNTHROID, LEVOTHROID) 125 MCG tablet Take 125 mcg by mouth daily.      . primidone (MYSOLINE) 250 MG tablet Take 1/2 tab three times a day      . propranolol (INDERAL) 20 MG tablet Take 20 mg by mouth 3 (three) times daily.      . simvastatin (ZOCOR) 20 MG tablet Take 1 tablet (20 mg total) by mouth at bedtime.  30 tablet  11   No current facility-administered medications for this visit.    No Known Allergies  History   Social History  . Marital Status: Married    Spouse Name: N/A    Number of Children: 1  . Years of Education: N/A   Occupational History  . Retired-welder     Social History Main Topics  . Smoking status: Former Smoker -- 0.50 packs/day for 20 years    Types: Cigarettes    Quit date: 08/21/2002  . Smokeless tobacco: Not on file     Comment: pt stopped smoking after his bypass  . Alcohol Use: No  . Drug Use: No  . Sexual Activity: Not on file   Other Topics Concern  . Not on file   Social History Narrative  . No narrative on file    Family History  Problem Relation Age of Onset  . CAD Brother   . Valvular heart disease Brother     Review of Systems:  As stated in the HPI and otherwise negative.   BP 130/78  Pulse 54  Ht 5\' 5"  (1.651 m)  Wt 220 lb (99.791 kg)  BMI 36.61 kg/m2  Physical Examination: General: Well developed, well nourished, NAD HEENT: OP clear, mucus membranes moist SKIN: warm, dry. No rashes. Neuro: No focal deficits Musculoskeletal: Muscle strength 5/5 all ext Psychiatric: Mood and affect normal Neck: No JVD, no carotid bruits, no thyromegaly, no lymphadenopathy. Lungs:Clear bilaterally, no wheezes, rhonci, crackles Cardiovascular: Regular rate and rhythm. No murmurs, gallops or rubs. Abdomen:Soft. Bowel sounds present. Non-tender.  Extremities: Trace lower extremity edema. Pulses are 2 + in the bilateral DP/PT.  Echo 06/26/12: Left ventricle: The cavity size was normal. Systolic function was normal. The estimated ejection fraction was in the range of 55% to 60%. Wall motion was normal; there were no regional wall motion abnormalities.  EKG: Sinus brady, rate 56 bpm. Incomplete RBBB. Non-specific T wave abnormality.   Assessment and Plan:   1. CAD: s/p CABG in 2004. No recent chest pain, SOB. Billy Smith is on good medical therapy. Billy Smith had rise in LFTs on higher dose of statin. Billy Smith has tolerated Zocor 20 mg po QHS.  Echo normal 11/13. BP controlled.

## 2013-11-21 ENCOUNTER — Other Ambulatory Visit: Payer: Self-pay | Admitting: Cardiovascular Disease

## 2014-01-09 ENCOUNTER — Encounter: Payer: Self-pay | Admitting: Cardiovascular Disease

## 2014-01-15 ENCOUNTER — Encounter: Payer: Self-pay | Admitting: Cardiovascular Disease

## 2014-01-28 IMAGING — CT CT HEAD W/O CM
2 series · 16 of 30 positions shown, 18 images · non-contrast
Comparison: none

[Series 2: head w/o · axial · non-contrast · 0.49mm/px · z∈[+54,+164]mm · 8 of 28 slices shown, 10 images]
[im 4/28  brain]
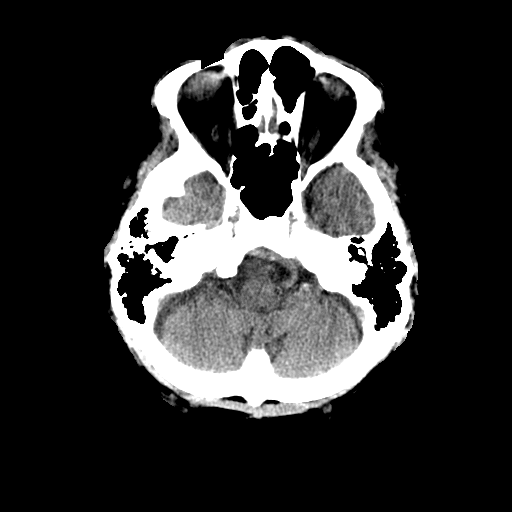
[im 4/28  bone]
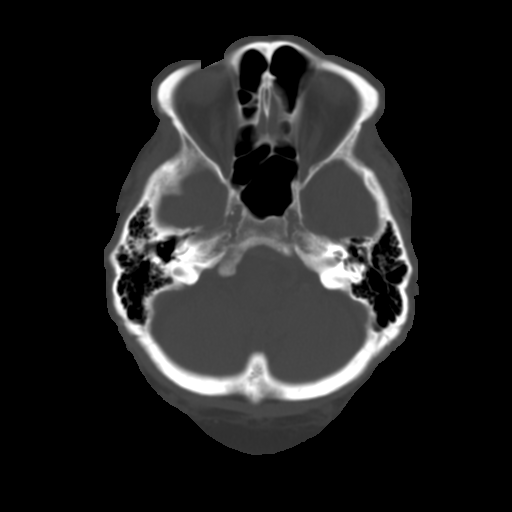
[im 7/28  brain]
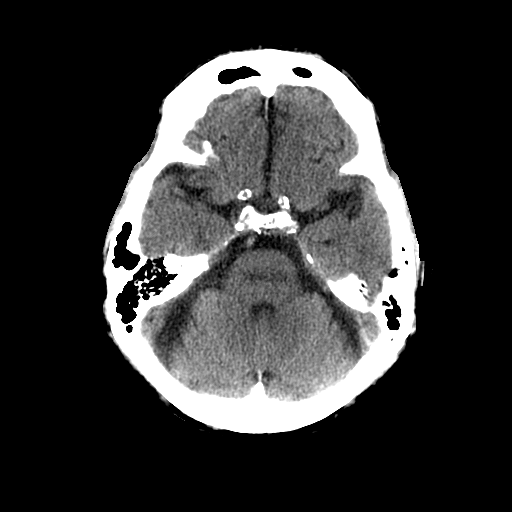
[im 10/28  brain]
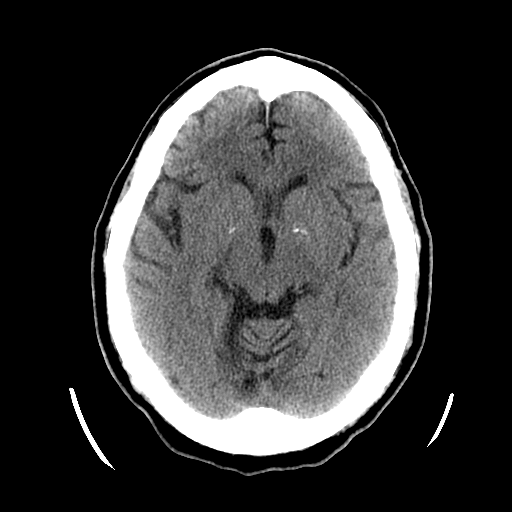
[im 13/28  brain]
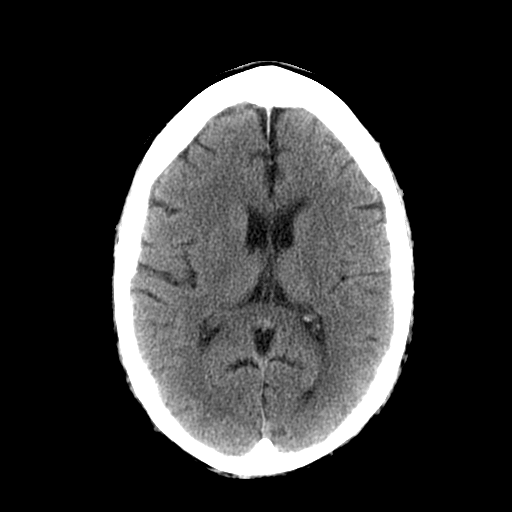
[im 16/28  brain]
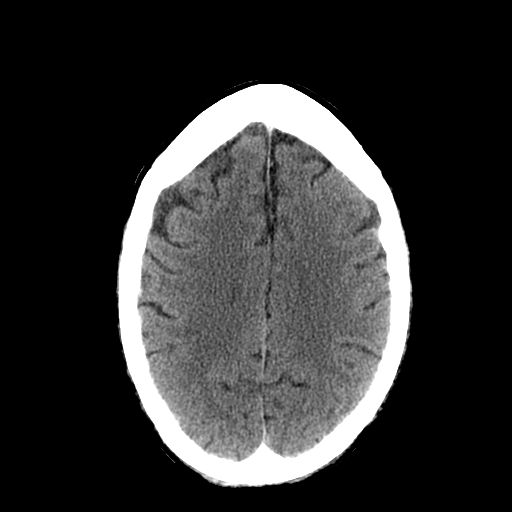
[im 16/28  bone]
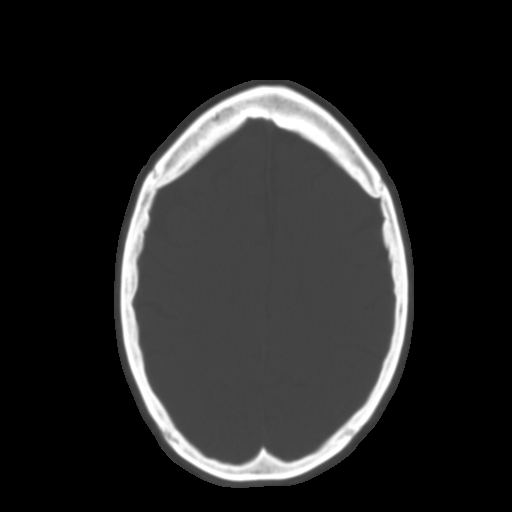
[im 19/28  brain]
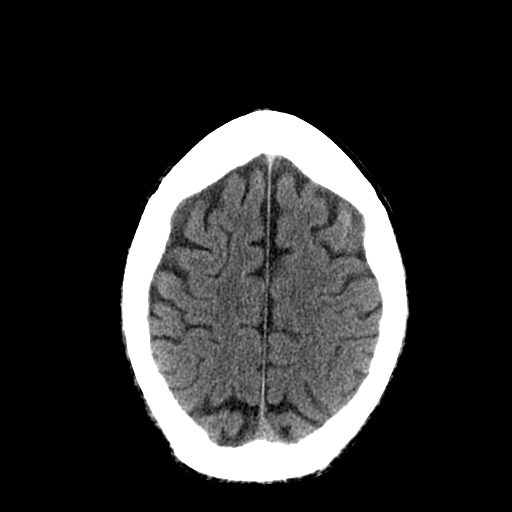
[im 22/28  brain]
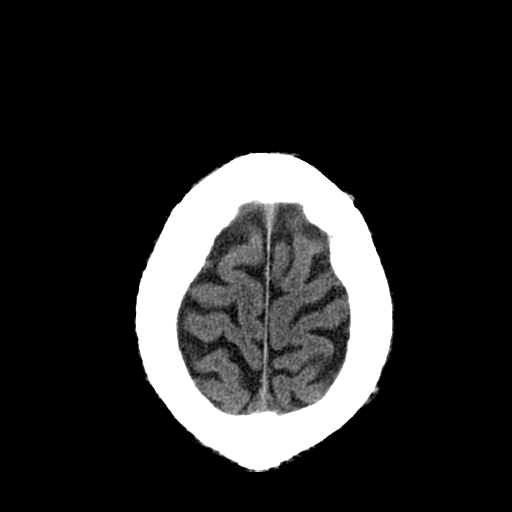
[im 25/28  brain]
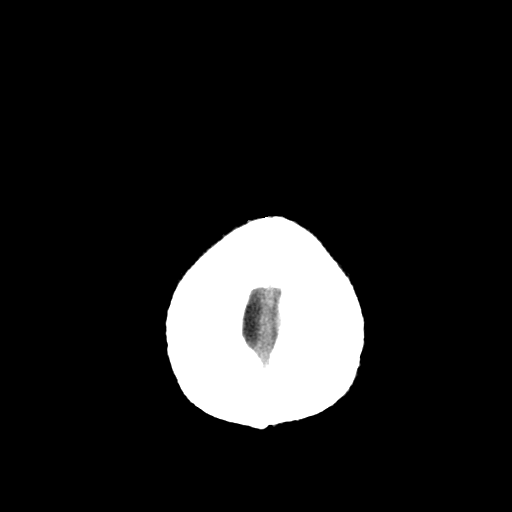

[Series 3: head bone · axial · 0.49mm/px · z∈[+50,+165]mm · 8 of 56 slices shown]
[im 6/56  bone]
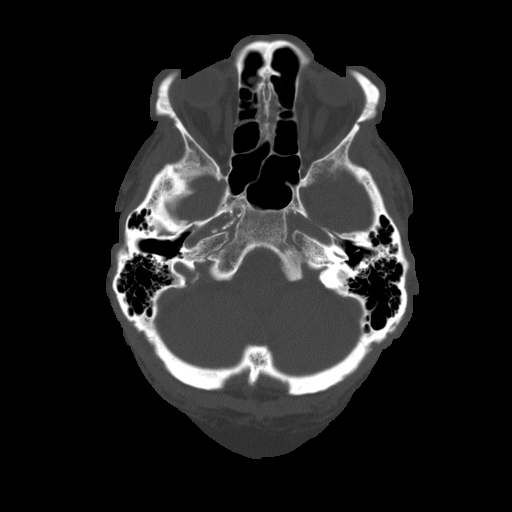
[im 12/56  bone]
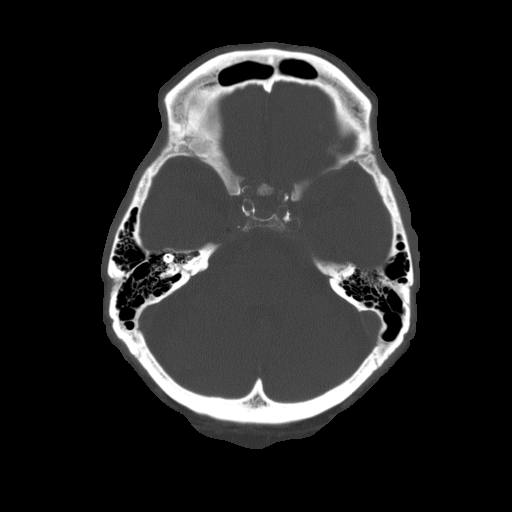
[im 18/56  bone]
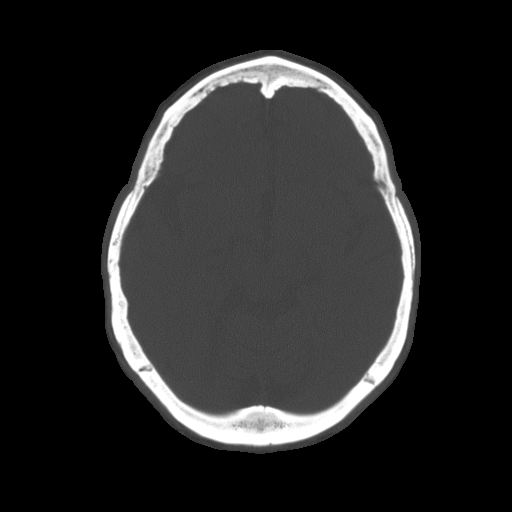
[im 24/56  bone]
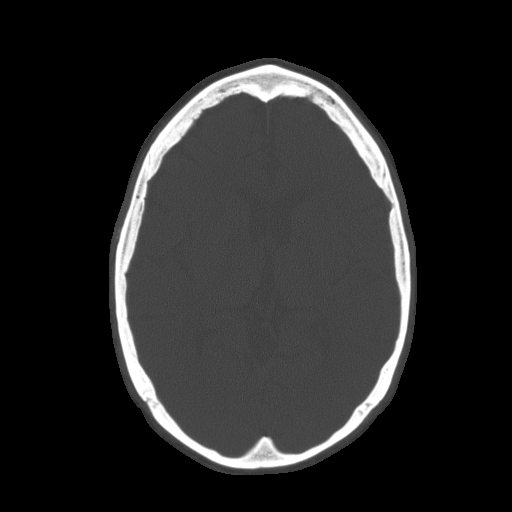
[im 32/56  bone]
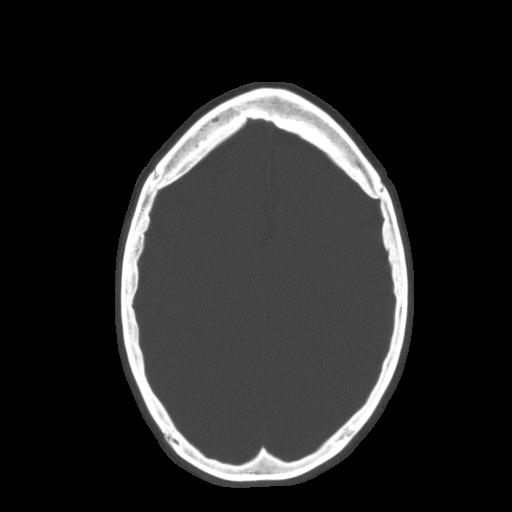
[im 38/56  bone]
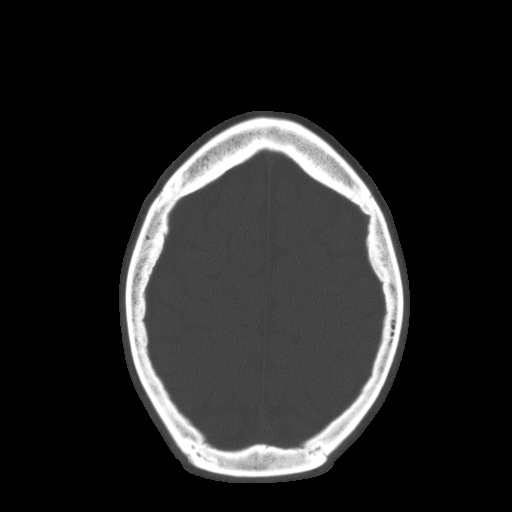
[im 44/56  bone]
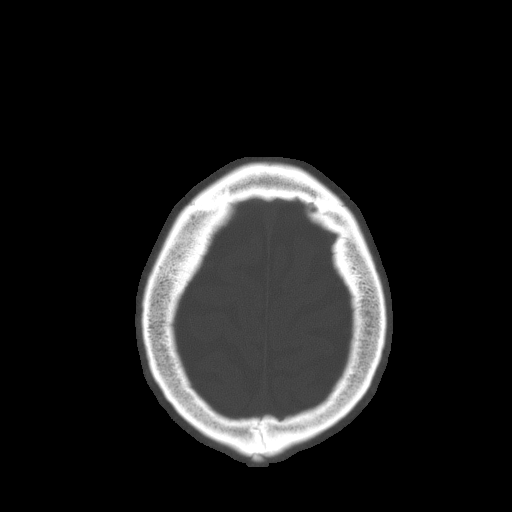
[im 50/56  bone]
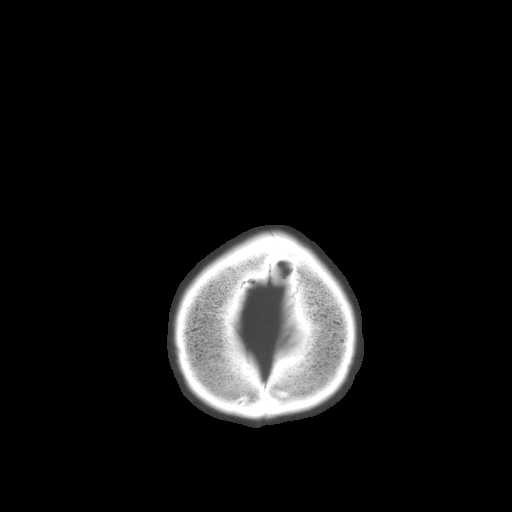

[16 of 30 positions shown; findings below may reference images not displayed]

This examination was performed at [HOSPITAL] at [HOSPITAL]. The interpretation will be provided by [REDACTED]

## 2014-02-02 ENCOUNTER — Ambulatory Visit: Payer: Medicare Other | Admitting: Cardiovascular Disease

## 2014-03-23 ENCOUNTER — Encounter: Payer: Self-pay | Admitting: Cardiovascular Disease

## 2014-03-23 ENCOUNTER — Ambulatory Visit (INDEPENDENT_AMBULATORY_CARE_PROVIDER_SITE_OTHER): Payer: Medicare Other | Admitting: Cardiovascular Disease

## 2014-03-23 VITALS — BP 130/80 | HR 82 | Ht 65.0 in | Wt 221.0 lb

## 2014-03-23 DIAGNOSIS — E785 Hyperlipidemia, unspecified: Secondary | ICD-10-CM

## 2014-03-23 DIAGNOSIS — I2510 Atherosclerotic heart disease of native coronary artery without angina pectoris: Secondary | ICD-10-CM

## 2014-03-23 DIAGNOSIS — I10 Essential (primary) hypertension: Secondary | ICD-10-CM

## 2014-03-23 NOTE — Patient Instructions (Signed)
Your physician wants you to follow-up in: 6 months.   You will receive a reminder letter in the mail two months in advance. If you don't receive a letter, please call our office to schedule the follow-up appointment.  Your physician has requested that you have a lexiscan myoview. For further information please visit [email or web address]. Please follow instruction sheet, as given.   

## 2014-03-23 NOTE — Progress Notes (Signed)
History of Present Illness: 80 yo white male with history of CAD, COPD, OSA, HLD, venous insufficiency who is here today for cardiac follow up. He has been followed in the past by Dr. Juanda Chance. In 2004 he had bypass surgery (LIMA to LAD, SVG to intermediate, SVG to circumflex marginal, SVG to PDA). He has done quite well since then.    He is here today for follow up. No chest pain, shortness of breath or palpitations. Overall doing well. He exercises 4 days per week at the [facility] on the bike. He enjoys mowing grass on his riding mower. He has not been wearing CPAP, taking Lasix or using midday Propranolol. Here with wife and daughter.   Primary Care Physician: Foye Deer   Fasting Lipids: Followed in primary care.    Past Medical History  Diagnosis Date  . Nephrolithiasis   . Venous insufficiency   . Abnormal involuntary movements(781.0)   . Chronic airway obstruction, not elsewhere classified   . Obstructive sleep apnea (adult) (pediatric)   . CAD (coronary artery disease)     with 4V CABG 2004    Past Surgical History  Procedure Laterality Date  . Coronary artery bypass graft  2004    Current Outpatient Prescriptions  Medication Sig Dispense Refill  . aspirin 325 MG tablet Take 325 mg by mouth daily.      . bimatoprost (LUMIGAN) 0.03 % ophthalmic solution 1 drop at bedtime.      . Folic Acid-Vit B6-Vit B12 (FOLBEE) 2.5-25-1 MG TABS Take 1 tablet by mouth daily.      . furosemide (LASIX) 20 MG tablet Take 1 tablet (20 mg total) by mouth daily.  30 tablet  11  . levothyroxine (SYNTHROID, LEVOTHROID) 125 MCG tablet Take 125 mcg by mouth daily.      . primidone (MYSOLINE) 250 MG tablet Take 1/2 tab three times a day      . propranolol (INDERAL) 20 MG tablet Take 20 mg by mouth 3 (three) times daily.      . simvastatin (ZOCOR) 40 MG tablet Take 40 mg by mouth daily.       No current facility-administered medications for this visit.    No Known Allergies  History    Social History  . Marital Status: Married    Spouse Name: N/A    Number of Children: 1  . Years of Education: N/A   Occupational History  . Retired-welder    Social History Main Topics  . Smoking status: Former Smoker -- 0.50 packs/day for 20 years    Types: Cigarettes    Quit date: 08/21/2002  . Smokeless tobacco: Not on file     Comment: pt stopped smoking after his bypass  . Alcohol Use: No  . Drug Use: No  . Sexual Activity: Not on file   Other Topics Concern  . Not on file   Social History Narrative  . No narrative on file    Family History  Problem Relation Age of Onset  . CAD Brother   . Valvular heart disease Brother     Review of Systems:  As stated in the HPI and otherwise negative.   BP 130/80  Pulse 82  Ht 5\' 5"  (1.651 m)  Wt 221 lb (100.245 kg)  BMI 36.78 kg/m2  Physical Examination: General: Well developed, well nourished, NAD HEENT: OP clear, mucus membranes moist SKIN: warm, dry. No rashes. Neuro: No focal deficits Musculoskeletal: Muscle strength 5/5 all ext Psychiatric: Mood and affect  normal Neck: No JVD, no carotid bruits, no thyromegaly, no lymphadenopathy. Lungs:Clear bilaterally, no wheezes, rhonci, crackles Cardiovascular: Regular rate and rhythm. No murmurs, gallops or rubs. Abdomen:Soft. Bowel sounds present. Non-tender.  Extremities: Trace lower extremity edema. Pulses are 2 + in the bilateral DP/PT.  Echo 06/26/12: Left ventricle: The cavity size was normal. Systolic function was normal. The estimated ejection fraction was in the range of 55% to 60%. Wall motion was normal; there were no regional wall motion abnormalities.  Assessment and Plan:   1. CAD: s/p CABG in 2004. No recent chest pain, SOB. He has had no ischemic testing in over 5 years .Will arrange Lexiscan stress myoview to exclude ischemia. Continue ASA, statin and beta blocker. He is on propranolol for CAD and tremors Echo normal 11/13.   2. HTN: BP well  controlled. No changes.   3. HLD: Lipids followed in primary care. Continue statin.

## 2014-04-01 ENCOUNTER — Encounter (HOSPITAL_COMMUNITY): Payer: Medicare Other

## 2014-04-02 ENCOUNTER — Encounter: Payer: Self-pay | Admitting: Internal Medicine

## 2014-04-08 ENCOUNTER — Ambulatory Visit (HOSPITAL_COMMUNITY): Payer: Medicare Other | Attending: Cardiovascular Disease | Admitting: Radiology

## 2014-04-08 VITALS — BP 105/61 | Ht 65.0 in | Wt 218.0 lb

## 2014-04-08 DIAGNOSIS — Z87891 Personal history of nicotine dependence: Secondary | ICD-10-CM | POA: Diagnosis not present

## 2014-04-08 DIAGNOSIS — J4489 Other specified chronic obstructive pulmonary disease: Secondary | ICD-10-CM | POA: Insufficient documentation

## 2014-04-08 DIAGNOSIS — R0602 Shortness of breath: Secondary | ICD-10-CM | POA: Diagnosis not present

## 2014-04-08 DIAGNOSIS — R002 Palpitations: Secondary | ICD-10-CM | POA: Insufficient documentation

## 2014-04-08 DIAGNOSIS — R0609 Other forms of dyspnea: Secondary | ICD-10-CM | POA: Insufficient documentation

## 2014-04-08 DIAGNOSIS — Z951 Presence of aortocoronary bypass graft: Secondary | ICD-10-CM | POA: Insufficient documentation

## 2014-04-08 DIAGNOSIS — I2510 Atherosclerotic heart disease of native coronary artery without angina pectoris: Secondary | ICD-10-CM | POA: Insufficient documentation

## 2014-04-08 DIAGNOSIS — R0989 Other specified symptoms and signs involving the circulatory and respiratory systems: Secondary | ICD-10-CM | POA: Diagnosis not present

## 2014-04-08 DIAGNOSIS — J449 Chronic obstructive pulmonary disease, unspecified: Secondary | ICD-10-CM | POA: Insufficient documentation

## 2014-04-08 DIAGNOSIS — I4949 Other premature depolarization: Secondary | ICD-10-CM

## 2014-04-08 MED ORDER — TECHNETIUM TC 99M SESTAMIBI GENERIC - CARDIOLITE
11.0000 | Freq: Once | INTRAVENOUS | Status: AC | PRN
  Administered 2014-04-08: 11 via INTRAVENOUS

## 2014-04-08 MED ORDER — TECHNETIUM TC 99M SESTAMIBI GENERIC - CARDIOLITE
33.0000 | Freq: Once | INTRAVENOUS | Status: AC | PRN
Start: 2014-04-08 — End: 2014-04-08
  Administered 2014-04-08: 33 via INTRAVENOUS

## 2014-04-08 MED ORDER — REGADENOSON 0.4 MG/5ML IV SOLN
0.4000 mg | Freq: Once | INTRAVENOUS | Status: AC
  Administered 2014-04-08: 0.4 mg via INTRAVENOUS

## 2014-04-08 NOTE — Progress Notes (Signed)
[facility] SITE 3 NUCLEAR MED [address] [place], [place] [address] [phone]    Cardiology Nuclear Med Study  Carlean PurlBobby J Smith is a 80 y.o. male     MRN : [id number]     DOB: 05-05-1933  Procedure Date: 04/08/2014  Nuclear Med Background Indication for Stress Test:  Evaluation for Ischemia, Graft Patency and Follow up CAD History:  COPD and CAD-CABG- MPI: > 11yrs ago '13 ECHO: EF: 55-60% Cardiac Risk Factors: History of Smoking and Lipids  Symptoms:  DOE, Palpitations and SOB   Nuclear Pre-Procedure Caffeine/Decaff Intake:  None NPO After: 9:00pm   Lungs:  clear O2 Sat: 96% on room air. IV 0.9% NS with Angio Cath:  22g  IV Site: R Hand  IV Started by:  Bonnita LevanJackie Smith, RN  Chest Size (in):  50 Cup Size: n/a  Height: 5\' 5"  (1.651 m)  Weight:  218 lb (98.884 kg)  BMI:  Body mass index is 36.28 kg/(m^2). Tech Comments:  N/A    Nuclear Med Study 1 or 2 day study: 1 day  Stress Test Type:  Lexiscan  Reading MD: N/A  Order Authorizing Provider:  Verne Carrowhristopher McAlhany, MD  Resting Radionuclide: Technetium 2856m Sestamibi  Resting Radionuclide Dose: 11.0 mCi   Stress Radionuclide:  Technetium 5756m Sestamibi  Stress Radionuclide Dose: 33.0 mCi           Stress Protocol Rest HR: 57 Stress HR: 82  Rest BP: 105/61 Stress BP: 121/58  Exercise Time (min): n/a METS: n/a   Predicted Max HR: 141 bpm % Max HR: 58.16 bpm Rate Pressure Product: 9922   Dose of Adenosine (mg):  n/a Dose of Lexiscan: 0.4 mg  Dose of Atropine (mg): n/a Dose of Dobutamine: n/a mcg/kg/min (at max HR)  Stress Test Technologist: Milana NaSabrina Williams, EMT-P  Nuclear Technologist:  Harlow AsaElizabeth Young, CNMT     Rest Procedure:  Myocardial perfusion imaging was performed at rest 45 minutes following the intravenous administration of Technetium 156m Sestamibi. Rest ECG: NSR with nonspecific T wave abnormality in inferolateral leads  Stress Procedure:  The patient received IV Lexiscan 0.4 mg over 15-seconds.   Technetium 3956m Sestamibi injected at 30-seconds. This patient had sob with the Lexiscan injection. Quantitative spect images were obtained after a 45 minute delay. Stress ECG: There are scattered PVCs.  QPS Raw Data Images:  There is interference from nuclear activity from structures below the diaphragm. This does not affect the ability to read the study. Stress Images:  Normal homogeneous uptake in all areas of the myocardium. Rest Images:  Normal homogeneous uptake in all areas of the myocardium. Subtraction (SDS):  No evidence of ischemia. Transient Ischemic Dilatation (Normal <1.22):  1.27 Lung/Heart Ratio (Normal <0.45):  0.39  Quantitative Gated Spect Images QGS EDV:  105 ml QGS ESV:  41 ml  Impression Exercise Capacity:  Lexiscan with no exercise. BP Response:  Normal blood pressure response. Clinical Symptoms:  There is dyspnea. ECG Impression:  No significant ECG changes with Lexiscan. Comparison with Prior Nuclear Study: No images to compare  Overall Impression:  Low risk stress nuclear study Normal myocardial perfusion with rest and stress images with no evidence of ischemia.  There was transient ischemic dilatation present..  LV Ejection Fraction: 61%.  LV Wall Motion:  NL LV Function; NL Wall Motion  Signed: Armanda Magicraci Tajae Maiolo, MD [facility]

## 2014-04-10 ENCOUNTER — Telehealth: Payer: Self-pay | Admitting: Cardiovascular Disease

## 2014-04-10 NOTE — Telephone Encounter (Signed)
New message ° ° ° ° °Want stress test results °

## 2014-04-10 NOTE — Telephone Encounter (Signed)
Spoke with pt's wife and reviewed stress test results with her. Will mail copy to pt and fax to Dr. Tomasa BlaseSchultz per her request.

## 2014-10-20 ENCOUNTER — Encounter: Payer: Self-pay | Admitting: Cardiovascular Disease

## 2014-10-26 ENCOUNTER — Ambulatory Visit: Payer: Medicare Other | Admitting: Cardiovascular Disease

## 2014-10-30 ENCOUNTER — Ambulatory Visit (INDEPENDENT_AMBULATORY_CARE_PROVIDER_SITE_OTHER): Payer: Medicare Other | Admitting: Cardiovascular Disease

## 2014-10-30 ENCOUNTER — Encounter: Payer: Self-pay | Admitting: Cardiovascular Disease

## 2014-10-30 VITALS — BP 130/58 | HR 52 | Ht 65.0 in | Wt 224.0 lb

## 2014-10-30 DIAGNOSIS — I25810 Atherosclerosis of coronary artery bypass graft(s) without angina pectoris: Secondary | ICD-10-CM

## 2014-10-30 DIAGNOSIS — I10 Essential (primary) hypertension: Secondary | ICD-10-CM | POA: Diagnosis not present

## 2014-10-30 DIAGNOSIS — I5032 Chronic diastolic (congestive) heart failure: Secondary | ICD-10-CM | POA: Diagnosis not present

## 2014-10-30 DIAGNOSIS — E785 Hyperlipidemia, unspecified: Secondary | ICD-10-CM | POA: Diagnosis not present

## 2014-10-30 MED ORDER — FUROSEMIDE 20 MG PO TABS: 20.0000 mg | ORAL_TABLET | Freq: Every day | ORAL | Status: DC

## 2014-10-30 NOTE — Patient Instructions (Signed)
Your physician wants you to follow-up in:  6 months. You will receive a reminder letter in the mail two months in advance. If you don't receive a letter, please call our office to schedule the follow-up appointment.   

## 2014-10-30 NOTE — Progress Notes (Signed)
CC: dyspnea with exertion.  History of Present Illness: 81 yo white male with history of CAD, COPD, OSA, HLD, venous insufficiency who is here today for cardiac follow up. He has been followed in the past by Dr. Juanda ChanceBrodie. In 2004 he had bypass surgery (LIMA to LAD, SVG to intermediate, SVG to circumflex marginal, SVG to PDA). Stress myoview August 2015 with no ischemia.   He is here today for follow up. No chest pain, shortness of breath or palpitations. Overall doing well. He has not been wearing CPAP, taking Lasix or using midday Propranolol. Here with wife and daughter. They are both concerned.   Primary Care Physician: Foye DeerDouglas Schultz   Fasting Lipids: Followed in primary care.    Past Medical History  Diagnosis Date  . Nephrolithiasis   . Venous insufficiency   . Abnormal involuntary movements(781.0)   . Chronic airway obstruction, not elsewhere classified   . Obstructive sleep apnea (adult) (pediatric)   . CAD (coronary artery disease)     with 4V CABG 2004    Past Surgical History  Procedure Laterality Date  . Coronary artery bypass graft  2004    Current Outpatient Prescriptions  Medication Sig Dispense Refill  . aspirin 325 MG tablet Take 325 mg by mouth daily.    . bimatoprost (LUMIGAN) 0.03 % ophthalmic solution 1 drop at bedtime.    . Folic Acid-Vit B6-Vit B12 (FOLBEE) 2.5-25-1 MG TABS Take 1 tablet by mouth daily.    . furosemide (LASIX) 20 MG tablet Take 1 tablet (20 mg total) by mouth daily. 30 tablet 11  . levothyroxine (SYNTHROID, LEVOTHROID) 125 MCG tablet Take 125 mcg by mouth daily.    . primidone (MYSOLINE) 250 MG tablet Take 1/2 tab three times a day    . propranolol (INDERAL) 20 MG tablet Take 20 mg by mouth 3 (three) times daily.    . simvastatin (ZOCOR) 40 MG tablet Take 40 mg by mouth daily.     No current facility-administered medications for this visit.    No Known Allergies  History   Social History  . Marital Status: Married    Spouse  Name: N/A  . Number of Children: 1  . Years of Education: N/A   Occupational History  . Retired-welder    Social History Main Topics  . Smoking status: Former Smoker -- 0.50 packs/day for 20 years    Types: Cigarettes    Quit date: 08/21/2002  . Smokeless tobacco: Not on file     Comment: pt stopped smoking after his bypass  . Alcohol Use: No  . Drug Use: No  . Sexual Activity: Not on file   Other Topics Concern  . Not on file   Social History Narrative    Family History  Problem Relation Age of Onset  . CAD Brother   . Valvular heart disease Brother     Review of Systems:  As stated in the HPI and otherwise negative.   BP 130/58 mmHg  Pulse 52  Ht 5\' 5"  (1.651 m)  Wt 224 lb (101.606 kg)  BMI 37.28 kg/m2  Physical Examination: General: Well developed, well nourished, NAD HEENT: OP clear, mucus membranes moist SKIN: warm, dry. No rashes. Neuro: No focal deficits Musculoskeletal: Muscle strength 5/5 all ext Psychiatric: Mood and affect normal Neck: No JVD, no carotid bruits, no thyromegaly, no lymphadenopathy. Lungs: Faint crackles bases bilaterally, no wheezes, rhonci Cardiovascular: Regular rate and rhythm. No murmurs, gallops or rubs. Abdomen:Soft. Bowel sounds present.  Non-tender.  Extremities: Trace bilateral lower extremity edema. Pulses are 2 + in the bilateral DP/PT.  Echo 06/26/12: Left ventricle: The cavity size was normal. Systolic function was normal. The estimated ejection fraction was in the range of 55% to 60%. Wall motion was normal; there were no regional wall motion abnormalities.  Stress myoview August 2015: Stress Procedure: The patient received IV Lexiscan 0.4 mg over 15-seconds. Technetium 10m Sestamibi injected at 30-seconds. This patient had sob with the Lexiscan injection. Quantitative spect images were obtained after a 45 minute delay. Stress ECG: There are scattered PVCs.  QPS Raw Data Images: There is interference from nuclear  activity from structures below the diaphragm. This does not affect the ability to read the study. Stress Images: Normal homogeneous uptake in all areas of the myocardium. Rest Images: Normal homogeneous uptake in all areas of the myocardium. Subtraction (SDS): No evidence of ischemia. Transient Ischemic Dilatation (Normal <1.22): 1.27 Lung/Heart Ratio (Normal <0.45): 0.39  Quantitative Gated Spect Images QGS EDV: 105 ml QGS ESV: 41 ml  Impression Exercise Capacity: Lexiscan with no exercise. BP Response: Normal blood pressure response. Clinical Symptoms: There is dyspnea. ECG Impression: No significant ECG changes with Lexiscan. Comparison with Prior Nuclear Study: No images to compare  Overall Impression: Low risk stress nuclear study Normal myocardial perfusion with rest and stress images with no evidence of ischemia. There was transient ischemic dilatation present..  LV Ejection Fraction: 61%. LV Wall Motion: NL LV Function; NL Wall Motion  EKG: sinus brady, rate 52 bpm. Incomplete RBBB. TWI anterolateral leads.   Assessment and Plan:   1. CAD: s/p CABG. Stable. Stress myoview August 2015. Continue ASA, statin and beta blocker. He is on propranolol for CAD and tremors. He is asked to resume TID dosing. Echo normal 11/13.   2. HTN: BP well controlled. No changes.   3. HLD: Lipids followed in primary care. Continue statin.   4. Chronic diastolic CHF: Will restart Lasix 20 mg daily.   5. OSA: He will discuss resuming CPAP with primary care.

## 2015-01-22 ENCOUNTER — Encounter: Payer: Self-pay | Admitting: Cardiovascular Disease

## 2015-06-01 ENCOUNTER — Ambulatory Visit: Payer: Medicare Other | Admitting: Cardiovascular Disease

## 2015-06-08 ENCOUNTER — Encounter: Payer: Self-pay | Admitting: Cardiovascular Disease

## 2015-06-14 ENCOUNTER — Encounter: Payer: Self-pay | Admitting: Cardiovascular Disease

## 2015-06-14 ENCOUNTER — Ambulatory Visit (INDEPENDENT_AMBULATORY_CARE_PROVIDER_SITE_OTHER): Payer: Medicare Other | Admitting: Cardiovascular Disease

## 2015-06-14 VITALS — BP 130/82 | HR 59 | Ht 65.0 in | Wt 215.8 lb

## 2015-06-14 DIAGNOSIS — I25810 Atherosclerosis of coronary artery bypass graft(s) without angina pectoris: Secondary | ICD-10-CM

## 2015-06-14 DIAGNOSIS — I5032 Chronic diastolic (congestive) heart failure: Secondary | ICD-10-CM | POA: Diagnosis not present

## 2015-06-14 DIAGNOSIS — E785 Hyperlipidemia, unspecified: Secondary | ICD-10-CM

## 2015-06-14 DIAGNOSIS — I10 Essential (primary) hypertension: Secondary | ICD-10-CM

## 2015-06-14 NOTE — Progress Notes (Signed)
Chief Complaint  Patient presents with  . Bleeding/Bruising     History of Present Illness: 81 yo white male with history of CAD, COPD, OSA, HLD, venous insufficiency who is here today for cardiac follow up. He has been followed in the past by Dr. Juanda ChanceBrodie. In 2004 he had bypass surgery (LIMA to LAD, SVG to intermediate, SVG to circumflex marginal, SVG to PDA). Stress myoview August 2015 with no ischemia.   He is here today for follow up. No chest pain, shortness of breath or palpitations. Overall doing well. He has not been wearing CPAP ad advised, taking Lasix or using midday Propranolol. We had this same discussion at the last visit. Here with wife and daughter. They are both concerned. He states that he "does not need to take those medicines".   Primary Care Physician: Foye DeerDouglas Schultz   Fasting Lipids: Followed in primary care.    Past Medical History  Diagnosis Date  . Nephrolithiasis   . Venous insufficiency   . Abnormal involuntary movements(781.0)   . Chronic airway obstruction, not elsewhere classified   . Obstructive sleep apnea (adult) (pediatric)   . CAD (coronary artery disease)     with 4V CABG 2004    Past Surgical History  Procedure Laterality Date  . Coronary artery bypass graft  2004    Current Outpatient Prescriptions  Medication Sig Dispense Refill  . aspirin 325 MG tablet Take 325 mg by mouth daily.    . bimatoprost (LUMIGAN) 0.03 % ophthalmic solution 1 drop at bedtime.    . Folic Acid-Vit B6-Vit B12 (FOLBEE) 2.5-25-1 MG TABS Take 1 tablet by mouth daily.    . furosemide (LASIX) 20 MG tablet Take 1 tablet (20 mg total) by mouth daily. 30 tablet 11  . levothyroxine (SYNTHROID, LEVOTHROID) 125 MCG tablet Take 125 mcg by mouth daily.    . primidone (MYSOLINE) 250 MG tablet Take 1/2 tab three times a day    . propranolol (INDERAL) 20 MG tablet Take 20 mg by mouth 3 (three) times daily.    . simvastatin (ZOCOR) 40 MG tablet Take 40 mg by mouth daily.      No current facility-administered medications for this visit.    No Known Allergies  Social History   Social History  . Marital Status: Married    Spouse Name: N/A  . Number of Children: 1  . Years of Education: N/A   Occupational History  . Retired-welder    Social History Main Topics  . Smoking status: Former Smoker -- 0.50 packs/day for 20 years    Types: Cigarettes    Quit date: 08/21/2002  . Smokeless tobacco: Not on file     Comment: pt stopped smoking after his bypass  . Alcohol Use: No  . Drug Use: No  . Sexual Activity: Not on file   Other Topics Concern  . Not on file   Social History Narrative    Family History  Problem Relation Age of Onset  . CAD Brother   . Valvular heart disease Brother     Review of Systems:  As stated in the HPI and otherwise negative.   BP 130/82 mmHg  Pulse 59  Ht 5\' 5"  (1.651 m)  Wt 215 lb 12.8 oz (97.886 kg)  BMI 35.91 kg/m2  SpO2 97%  Physical Examination: General: Well developed, well nourished, NAD HEENT: OP clear, mucus membranes moist SKIN: warm, dry. No rashes. Neuro: No focal deficits Musculoskeletal: Muscle strength 5/5 all ext Psychiatric:  Mood and affect normal Neck: No JVD, no carotid bruits, no thyromegaly, no lymphadenopathy. Lungs: Faint crackles bases bilaterally, no wheezes, rhonci Cardiovascular: Regular rate and rhythm. No murmurs, gallops or rubs. Abdomen:Soft. Bowel sounds present. Non-tender.  Extremities: Trace bilateral lower extremity edema. Pulses are 2 + in the bilateral DP/PT.  Echo 06/26/12: Left ventricle: The cavity size was normal. Systolic function was normal. The estimated ejection fraction was in the range of 55% to 60%. Wall motion was normal; there were no regional wall motion abnormalities.  Stress myoview August 2015: Stress Procedure: The patient received IV Lexiscan 0.4 mg over 15-seconds. Technetium 64m Sestamibi injected at 30-seconds. This patient had sob with the  Lexiscan injection. Quantitative spect images were obtained after a 45 minute delay. Stress ECG: There are scattered PVCs. QPS Raw Data Images: There is interference from nuclear activity from structures below the diaphragm. This does not affect the ability to read the study. Stress Images: Normal homogeneous uptake in all areas of the myocardium. Rest Images: Normal homogeneous uptake in all areas of the myocardium. Subtraction (SDS): No evidence of ischemia. Transient Ischemic Dilatation (Normal <1.22): 1.27 Lung/Heart Ratio (Normal <0.45): 0.39 Quantitative Gated Spect Images QGS EDV: 105 ml QGS ESV: 41 ml Impression Exercise Capacity: Lexiscan with no exercise. BP Response: Normal blood pressure response. Clinical Symptoms: There is dyspnea. ECG Impression: No significant ECG changes with Lexiscan. Comparison with Prior Nuclear Study: No images to compare Overall Impression: Low risk stress nuclear study Normal myocardial perfusion with rest and stress images with no evidence of ischemia. There was transient ischemic dilatation present.. LV Ejection Fraction: 61%. LV Wall Motion: NL LV Function; NL Wall Motion EKG: sinus brady, rate 52 bpm. Incomplete RBBB. TWI anterolateral leads.   EKG:  EKG is not ordered today. The ekg ordered today demonstrates   Recent Labs: No results found for requested labs within last 365 days.   Lipid Panel No results found for: CHOL, TRIG, HDL, CHOLHDL, VLDL, LDLCALC, LDLDIRECT   Wt Readings from Last 3 Encounters:  06/14/15 215 lb 12.8 oz (97.886 kg)  10/30/14 224 lb (101.606 kg)  04/08/14 218 lb (98.884 kg)     Other studies Reviewed: Additional studies/ records that were reviewed today include: . Review of the above records demonstrates:   Assessment and Plan:   1. CAD: s/p CABG. Stable. Stress myoview August 2015. Continue ASA, statin and beta blocker. He is on propranolol for CAD and tremors. He is asked to resume TID  dosing but he would rather take BID. Echo normal 11/13.   2. HTN: BP well controlled. No changes.   3. HLD: Lipids followed in primary care and well controlled with last check this year. Continue statin.   4. Chronic diastolic CHF: Will continue Lasix 20 mg daily but he has not been taking since last visit despite advice to do so.   5. OSA: He will discuss resuming CPAP with primary care.   Current medicines are reviewed at length with the patient today.  The patient does not have concerns regarding medicines.  The following changes have been made:  no change  Labs/ tests ordered today include:  No orders of the defined types were placed in this encounter.    Disposition:   FU with me in 6 months  Signed, Verne Carrow, MD 06/14/2015 1:02 PM    [facility] [address] [place], [place], [place]  [address] Phone: [phone]; Fax: [phone]

## 2015-06-14 NOTE — Patient Instructions (Signed)

## 2015-08-02 ENCOUNTER — Encounter: Payer: Self-pay | Admitting: Cardiovascular Disease

## 2015-10-25 ENCOUNTER — Other Ambulatory Visit: Payer: Self-pay | Admitting: Cardiovascular Disease

## 2016-01-23 NOTE — Progress Notes (Signed)
Chief Complaint  Patient presents with  . Follow-up    History of Present Illness: 82 yo white male with history of CAD, COPD, OSA, HLD, venous insufficiency who is here today for cardiac follow up. In 2004 he had bypass surgery (LIMA to LAD, SVG to intermediate, SVG to circumflex marginal, SVG to PDA). Stress myoview August 2015 with no ischemia. Echo November 2013 with normal LV size and function.   He is here today for follow up. No chest pain, shortness of breath or palpitations. Overall doing well. He has been wearing CPAP ad advised. He is not taking Lasix or using midday Propranolol. We had this same discussion at the last visit. Here with wife and daughter. They are both concerned.   Primary Care Physician: Paulina FusiSCHULTZ,DOUGLAS E, MD  Fasting Lipids: Followed in primary care.    Past Medical History  Diagnosis Date  . Nephrolithiasis   . Venous insufficiency   . Abnormal involuntary movements(781.0)   . Chronic airway obstruction, not elsewhere classified   . Obstructive sleep apnea (adult) (pediatric)   . CAD (coronary artery disease)     with 4V CABG 2004    Past Surgical History  Procedure Laterality Date  . Coronary artery bypass graft  2004    Current Outpatient Prescriptions  Medication Sig Dispense Refill  . aspirin 325 MG tablet Take 325 mg by mouth daily.    . bimatoprost (LUMIGAN) 0.03 % ophthalmic solution Place 1 drop into both eyes at bedtime.     . Folic Acid-Vit B6-Vit B12 (FOLBEE) 2.5-25-1 MG TABS Take 1 tablet by mouth daily.    . furosemide (LASIX) 20 MG tablet TAKE ONE TABLET BY MOUTH ONCE DAILY 30 tablet 1  . levothyroxine (SYNTHROID, LEVOTHROID) 125 MCG tablet Take 125 mcg by mouth daily.    . primidone (MYSOLINE) 250 MG tablet Take 1/2 tab three times a day    . propranolol (INDERAL) 20 MG tablet Take 20 mg by mouth 3 (three) times daily.    . simvastatin (ZOCOR) 40 MG tablet Take 40 mg by mouth daily.     No current facility-administered  medications for this visit.    No Known Allergies  Social History   Social History  . Marital Status: Married    Spouse Name: N/A  . Number of Children: 1  . Years of Education: N/A   Occupational History  . Retired-welder    Social History Main Topics  . Smoking status: Former Smoker -- 0.50 packs/day for 20 years    Types: Cigarettes    Quit date: 08/21/2002  . Smokeless tobacco: Not on file     Comment: pt stopped smoking after his bypass  . Alcohol Use: No  . Drug Use: No  . Sexual Activity: Not on file   Other Topics Concern  . Not on file   Social History Narrative    Family History  Problem Relation Age of Onset  . CAD Brother   . Valvular heart disease Brother   . Heart attack Neg Hx   . Hypertension Mother   . Hypertension Brother   . Stroke Neg Hx     Review of Systems:  As stated in the HPI and otherwise negative.   BP 132/70 mmHg  Pulse 53  Ht 5\' 5"  (1.651 m)  Wt 216 lb 12.8 oz (98.34 kg)  BMI 36.08 kg/m2  Physical Examination: General: Well developed, well nourished, NAD HEENT: OP clear, mucus membranes moist SKIN: warm, dry. No  rashes. Neuro: No focal deficits Musculoskeletal: Muscle strength 5/5 all ext Psychiatric: Mood and affect normal Neck: No JVD, no carotid bruits, no thyromegaly, no lymphadenopathy. Lungs: Faint crackles bases bilaterally, no wheezes, rhonci Cardiovascular: Regular rate and rhythm. No murmurs, gallops or rubs. Abdomen:Soft. Bowel sounds present. Non-tender.  Extremities: Trace bilateral lower extremity edema. Pulses are 2 + in the bilateral DP/PT.  Echo 06/26/12: Left ventricle: The cavity size was normal. Systolic function was normal. The estimated ejection fraction was in the range of 55% to 60%. Wall motion was normal; there were no regional wall motion abnormalities.  Stress myoview August 2015: Stress Procedure: The patient received IV Lexiscan 0.4 mg over 15-seconds. Technetium 64m Sestamibi injected  at 30-seconds. This patient had sob with the Lexiscan injection. Quantitative spect images were obtained after a 45 minute delay. Stress ECG: There are scattered PVCs. QPS Raw Data Images: There is interference from nuclear activity from structures below the diaphragm. This does not affect the ability to read the study. Stress Images: Normal homogeneous uptake in all areas of the myocardium. Rest Images: Normal homogeneous uptake in all areas of the myocardium. Subtraction (SDS): No evidence of ischemia. Transient Ischemic Dilatation (Normal <1.22): 1.27 Lung/Heart Ratio (Normal <0.45): 0.39 Quantitative Gated Spect Images QGS EDV: 105 ml QGS ESV: 41 ml Impression Exercise Capacity: Lexiscan with no exercise. BP Response: Normal blood pressure response. Clinical Symptoms: There is dyspnea. ECG Impression: No significant ECG changes with Lexiscan. Comparison with Prior Nuclear Study: No images to compare Overall Impression: Low risk stress nuclear study Normal myocardial perfusion with rest and stress images with no evidence of ischemia. There was transient ischemic dilatation present.. LV Ejection Fraction: 61%. LV Wall Motion: NL LV Function; NL Wall Motion EKG: sinus brady, rate 52 bpm. Incomplete RBBB. TWI anterolateral leads.   EKG:  EKG is ordered today. The ekg ordered today demonstrates Sinus brady, rate 53 bpm. Incomplete RBBB. Non-specific T wave abn. Unchanged.   Recent Labs: No results found for requested labs within last 365 days.   Lipid Panel No results found for: CHOL, TRIG, HDL, CHOLHDL, VLDL, LDLCALC, LDLDIRECT   Wt Readings from Last 3 Encounters:  01/24/16 216 lb 12.8 oz (98.34 kg)  06/14/15 215 lb 12.8 oz (97.886 kg)  10/30/14 224 lb (101.606 kg)     Other studies Reviewed: Additional studies/ records that were reviewed today include: . Review of the above records demonstrates:   Assessment and Plan:   1. CAD: s/p CABG. He seems to be  stable. He is a difficult historina. He denies chest pain or dyspnea. EKG is unchanged. Stress myoview August 2015. Continue ASA, statin and beta blocker. He is on propranolol for CAD and tremors. He is asked to resume TID dosing but he would rather take BID. Echo normal 11/13.   2. HTN: BP well controlled. No changes.   3. HLD: Lipids followed in primary care and well controlled per pt and family. Continue statin.   4. Chronic diastolic CHF: Will continue Lasix 20 mg as needed. He has not been taking since last visit despite advice to do so. No weight gain or swelling  5. OSA: Followed in primary care  Current medicines are reviewed at length with the patient today.  The patient does not have concerns regarding medicines.  The following changes have been made:  no change  Labs/ tests ordered today include:   Orders Placed This Encounter  Procedures  . EKG 12-Lead    Disposition:   FU  with me in 6 months  Signed, Verne Carrow, MD 01/24/2016 1:32 PM    [facility] [address] [place], [place], [place]  [address] Phone: [phone]; Fax: [phone]

## 2016-01-24 ENCOUNTER — Ambulatory Visit (INDEPENDENT_AMBULATORY_CARE_PROVIDER_SITE_OTHER): Payer: PPO | Admitting: Cardiovascular Disease

## 2016-01-24 ENCOUNTER — Encounter: Payer: Self-pay | Admitting: Cardiovascular Disease

## 2016-01-24 VITALS — BP 132/70 | HR 53 | Ht 65.0 in | Wt 216.8 lb

## 2016-01-24 DIAGNOSIS — I25810 Atherosclerosis of coronary artery bypass graft(s) without angina pectoris: Secondary | ICD-10-CM

## 2016-01-24 DIAGNOSIS — E785 Hyperlipidemia, unspecified: Secondary | ICD-10-CM

## 2016-01-24 DIAGNOSIS — I5032 Chronic diastolic (congestive) heart failure: Secondary | ICD-10-CM | POA: Diagnosis not present

## 2016-01-24 DIAGNOSIS — I10 Essential (primary) hypertension: Secondary | ICD-10-CM

## 2016-01-24 NOTE — Patient Instructions (Signed)

## 2016-01-28 DIAGNOSIS — Z9181 History of falling: Secondary | ICD-10-CM | POA: Diagnosis not present

## 2016-01-28 DIAGNOSIS — E785 Hyperlipidemia, unspecified: Secondary | ICD-10-CM | POA: Diagnosis not present

## 2016-01-28 DIAGNOSIS — I25810 Atherosclerosis of coronary artery bypass graft(s) without angina pectoris: Secondary | ICD-10-CM | POA: Diagnosis not present

## 2016-01-28 DIAGNOSIS — Z79899 Other long term (current) drug therapy: Secondary | ICD-10-CM | POA: Diagnosis not present

## 2016-01-28 DIAGNOSIS — Z125 Encounter for screening for malignant neoplasm of prostate: Secondary | ICD-10-CM | POA: Diagnosis not present

## 2016-01-28 DIAGNOSIS — Z139 Encounter for screening, unspecified: Secondary | ICD-10-CM | POA: Diagnosis not present

## 2016-01-28 DIAGNOSIS — G479 Sleep disorder, unspecified: Secondary | ICD-10-CM | POA: Diagnosis not present

## 2016-01-28 DIAGNOSIS — R413 Other amnesia: Secondary | ICD-10-CM | POA: Diagnosis not present

## 2016-01-28 DIAGNOSIS — I5032 Chronic diastolic (congestive) heart failure: Secondary | ICD-10-CM | POA: Diagnosis not present

## 2016-01-28 DIAGNOSIS — Z6837 Body mass index (BMI) 37.0-37.9, adult: Secondary | ICD-10-CM | POA: Diagnosis not present

## 2016-01-28 DIAGNOSIS — E038 Other specified hypothyroidism: Secondary | ICD-10-CM | POA: Diagnosis not present

## 2016-01-28 DIAGNOSIS — Z1389 Encounter for screening for other disorder: Secondary | ICD-10-CM | POA: Diagnosis not present

## 2016-01-31 ENCOUNTER — Encounter: Payer: Self-pay | Admitting: Cardiovascular Disease

## 2016-03-13 DIAGNOSIS — H52223 Regular astigmatism, bilateral: Secondary | ICD-10-CM | POA: Diagnosis not present

## 2016-03-13 DIAGNOSIS — H401112 Primary open-angle glaucoma, right eye, moderate stage: Secondary | ICD-10-CM | POA: Diagnosis not present

## 2016-03-13 DIAGNOSIS — H2513 Age-related nuclear cataract, bilateral: Secondary | ICD-10-CM | POA: Diagnosis not present

## 2016-05-26 DIAGNOSIS — S80211A Abrasion, right knee, initial encounter: Secondary | ICD-10-CM | POA: Diagnosis not present

## 2016-05-26 DIAGNOSIS — Z5329 Procedure and treatment not carried out because of patient's decision for other reasons: Secondary | ICD-10-CM | POA: Diagnosis not present

## 2016-05-26 DIAGNOSIS — S80212A Abrasion, left knee, initial encounter: Secondary | ICD-10-CM | POA: Diagnosis not present

## 2016-05-26 DIAGNOSIS — W1781XA Fall down embankment (hill), initial encounter: Secondary | ICD-10-CM | POA: Diagnosis not present

## 2016-05-28 DIAGNOSIS — R296 Repeated falls: Secondary | ICD-10-CM | POA: Diagnosis not present

## 2016-05-28 DIAGNOSIS — R531 Weakness: Secondary | ICD-10-CM | POA: Diagnosis not present

## 2016-05-29 DIAGNOSIS — S7012XA Contusion of left thigh, initial encounter: Secondary | ICD-10-CM | POA: Diagnosis not present

## 2016-05-29 DIAGNOSIS — H9193 Unspecified hearing loss, bilateral: Secondary | ICD-10-CM | POA: Diagnosis not present

## 2016-05-29 DIAGNOSIS — Z79899 Other long term (current) drug therapy: Secondary | ICD-10-CM | POA: Diagnosis not present

## 2016-05-29 DIAGNOSIS — I5032 Chronic diastolic (congestive) heart failure: Secondary | ICD-10-CM | POA: Diagnosis not present

## 2016-05-29 DIAGNOSIS — H6123 Impacted cerumen, bilateral: Secondary | ICD-10-CM | POA: Diagnosis not present

## 2016-05-29 DIAGNOSIS — R748 Abnormal levels of other serum enzymes: Secondary | ICD-10-CM | POA: Diagnosis not present

## 2016-05-29 DIAGNOSIS — W010XXA Fall on same level from slipping, tripping and stumbling without subsequent striking against object, initial encounter: Secondary | ICD-10-CM | POA: Diagnosis not present

## 2016-05-29 DIAGNOSIS — I25810 Atherosclerosis of coronary artery bypass graft(s) without angina pectoris: Secondary | ICD-10-CM | POA: Diagnosis not present

## 2016-05-29 DIAGNOSIS — R262 Difficulty in walking, not elsewhere classified: Secondary | ICD-10-CM | POA: Diagnosis not present

## 2016-05-29 DIAGNOSIS — Z6838 Body mass index (BMI) 38.0-38.9, adult: Secondary | ICD-10-CM | POA: Diagnosis not present

## 2016-06-16 DIAGNOSIS — Z23 Encounter for immunization: Secondary | ICD-10-CM | POA: Diagnosis not present

## 2016-08-03 ENCOUNTER — Encounter: Payer: Self-pay | Admitting: Cardiovascular Disease

## 2016-08-03 DIAGNOSIS — I5032 Chronic diastolic (congestive) heart failure: Secondary | ICD-10-CM | POA: Diagnosis not present

## 2016-08-03 DIAGNOSIS — Z79899 Other long term (current) drug therapy: Secondary | ICD-10-CM | POA: Diagnosis not present

## 2016-08-03 DIAGNOSIS — G4733 Obstructive sleep apnea (adult) (pediatric): Secondary | ICD-10-CM | POA: Diagnosis not present

## 2016-08-03 DIAGNOSIS — Z6837 Body mass index (BMI) 37.0-37.9, adult: Secondary | ICD-10-CM | POA: Diagnosis not present

## 2016-08-03 DIAGNOSIS — E785 Hyperlipidemia, unspecified: Secondary | ICD-10-CM | POA: Diagnosis not present

## 2016-08-03 DIAGNOSIS — I25810 Atherosclerosis of coronary artery bypass graft(s) without angina pectoris: Secondary | ICD-10-CM | POA: Diagnosis not present

## 2016-08-03 DIAGNOSIS — E038 Other specified hypothyroidism: Secondary | ICD-10-CM | POA: Diagnosis not present

## 2016-08-07 DIAGNOSIS — E875 Hyperkalemia: Secondary | ICD-10-CM | POA: Diagnosis not present

## 2016-08-08 NOTE — Progress Notes (Signed)
Chief Complaint  Patient presents with  . Coronary Artery Disease    pt has had recent falls    History of Present Illness: 82 yo white male with history of CAD, COPD, OSA, HLD, venous insufficiency who is here today for cardiac follow up. In 2004 he had bypass surgery (LIMA to LAD, SVG to intermediate, SVG to circumflex marginal, SVG to PDA). Stress myoview August 2015 with no ischemia. Echo November 2013 with normal LV size and function.   He is here today for follow up. No chest pain, shortness of breath or palpitations. Overall doing well. He is here with his wife and daughter. They think he has more fatigue but he disagrees. He exercises 3 days per week. He fell while working on his lawnmower last week. No syncope. No pain in legs.   Primary Care Physician: Paulina FusiSCHULTZ,DOUGLAS E, MD   Past Medical History:  Diagnosis Date  . Abnormal involuntary movements(781.0)   . CAD (coronary artery disease)    with 4V CABG 2004  . Chronic airway obstruction, not elsewhere classified (HCC)   . Nephrolithiasis   . Obstructive sleep apnea (adult) (pediatric)   . Venous insufficiency     Past Surgical History:  Procedure Laterality Date  . CORONARY ARTERY BYPASS GRAFT  2004    Current Outpatient Prescriptions  Medication Sig Dispense Refill  . aspirin 325 MG tablet Take 325 mg by mouth daily.    . bimatoprost (LUMIGAN) 0.03 % ophthalmic solution Place 1 drop into both eyes at bedtime.     . Folic Acid-Vit B6-Vit B12 (FOLBEE) 2.5-25-1 MG TABS Take 1 tablet by mouth daily.    . furosemide (LASIX) 20 MG tablet TAKE ONE TABLET BY MOUTH ONCE DAILY 30 tablet 1  . levothyroxine (SYNTHROID, LEVOTHROID) 125 MCG tablet Take 125 mcg by mouth daily.    . primidone (MYSOLINE) 250 MG tablet Take 125 mg by mouth 3 (three) times daily. Take 1/2 tab three times a day     . propranolol (INDERAL) 20 MG tablet Take 20 mg by mouth 3 (three) times daily.    . simvastatin (ZOCOR) 40 MG tablet Take 40 mg by  mouth daily.     No current facility-administered medications for this visit.     No Known Allergies  Social History   Social History  . Marital status: Married    Spouse name: N/A  . Number of children: 1  . Years of education: N/A   Occupational History  . Retired-welder    Social History Main Topics  . Smoking status: Former Smoker    Packs/day: 0.50    Years: 20.00    Types: Cigarettes    Quit date: 08/21/2002  . Smokeless tobacco: Not on file     Comment: pt stopped smoking after his bypass  . Alcohol use No  . Drug use: No  . Sexual activity: Not on file   Other Topics Concern  . Not on file   Social History Narrative  . No narrative on file    Family History  Problem Relation Age of Onset  . CAD Brother   . Valvular heart disease Brother   . Heart attack Neg Hx   . Hypertension Mother   . Hypertension Brother   . Stroke Neg Hx     Review of Systems:  As stated in the HPI and otherwise negative.   BP 132/60   Pulse (!) 57   Ht 5\' 5"  (1.651 m)  Wt 222 lb 6.4 oz (100.9 kg)   BMI 37.01 kg/m   Physical Examination: General: Well developed, well nourished, NAD  HEENT: OP clear, mucus membranes moist  SKIN: warm, dry. No rashes. Neuro: No focal deficits  Musculoskeletal: Muscle strength 5/5 all ext  Psychiatric: Mood and affect normal  Neck: No JVD, no carotid bruits, no thyromegaly, no lymphadenopathy.  Lungs: Faint crackles bases bilaterally, no wheezes, rhonci Cardiovascular: Regular rate and rhythm. No murmurs, gallops or rubs. Abdomen:Soft. Bowel sounds present. Non-tender.  Extremities: Trace bilateral lower extremity edema. Pulses are 2 + in the bilateral DP/PT.  Echo 06/26/12: Left ventricle: The cavity size was normal. Systolic function was normal. The estimated ejection fraction was in the range of 55% to 60%. Wall motion was normal; there were no regional wall motion abnormalities.  Stress myoview August 2015: Stress Procedure:  The patient received IV Lexiscan 0.4 mg over 15-seconds. Technetium 55m Sestamibi injected at 30-seconds. This patient had sob with the Lexiscan injection. Quantitative spect images were obtained after a 45 minute delay. Stress ECG: There are scattered PVCs. QPS Raw Data Images: There is interference from nuclear activity from structures below the diaphragm. This does not affect the ability to read the study. Stress Images: Normal homogeneous uptake in all areas of the myocardium. Rest Images: Normal homogeneous uptake in all areas of the myocardium. Subtraction (SDS): No evidence of ischemia. Transient Ischemic Dilatation (Normal <1.22): 1.27 Lung/Heart Ratio (Normal <0.45): 0.39 Quantitative Gated Spect Images QGS EDV: 105 ml QGS ESV: 41 ml Impression Exercise Capacity: Lexiscan with no exercise. BP Response: Normal blood pressure response. Clinical Symptoms: There is dyspnea. ECG Impression: No significant ECG changes with Lexiscan. Comparison with Prior Nuclear Study: No images to compare Overall Impression: Low risk stress nuclear study Normal myocardial perfusion with rest and stress images with no evidence of ischemia. There was transient ischemic dilatation present.. LV Ejection Fraction: 61%. LV Wall Motion: NL LV Function; NL Wall Motion EKG: sinus brady, rate 52 bpm. Incomplete RBBB. TWI anterolateral leads.   EKG:  EKG is ordered today. The ekg ordered today demonstrates    Recent Labs: No results found for requested labs within last 8760 hours.   Lipid Panel No results found for: CHOL, TRIG, HDL, CHOLHDL, VLDL, LDLCALC, LDLDIRECT   Wt Readings from Last 3 Encounters:  08/09/16 222 lb 6.4 oz (100.9 kg)  01/24/16 216 lb 12.8 oz (98.3 kg)  06/14/15 215 lb 12.8 oz (97.9 kg)     Other studies Reviewed: Additional studies/ records that were reviewed today include: . Review of the above records demonstrates:   Assessment and Plan:   1. CAD: s/p CABG.  He seems to be stable. He is a difficult historian. He denies chest pain or dyspnea. EKG is unchanged. Stress myoview August 2015 with no ischemia. Continue ASA, statin and beta blocker. He is on propranolol for CAD and tremors. Echo normal 11/13.   2. HTN: BP well controlled. No changes.   3. HLD: Lipids followed in primary care and well controlled per pt and family. Continue statin.   4. Chronic diastolic CHF: Will continue Lasix 20 mg as needed. He has not been taking since last visit despite advice to do so. No weight gain or swelling  5. OSA: Followed in primary care  Current medicines are reviewed at length with the patient today.  The patient does not have concerns regarding medicines.  The following changes have been made:  no change  Labs/ tests ordered today include:  Orders Placed This Encounter  Procedures  . EKG 12-Lead    Disposition:   FU with me in 6 months  Signed, Verne Carrowhristopher Marilla Boddy, MD 08/09/2016 2:23 PM    [facility] [address], [place], [place]  [address] Phone: [phone]; Fax: [phone]

## 2016-08-09 ENCOUNTER — Encounter (INDEPENDENT_AMBULATORY_CARE_PROVIDER_SITE_OTHER): Payer: Self-pay

## 2016-08-09 ENCOUNTER — Ambulatory Visit (INDEPENDENT_AMBULATORY_CARE_PROVIDER_SITE_OTHER): Payer: PPO | Admitting: Cardiovascular Disease

## 2016-08-09 VITALS — BP 132/60 | HR 57 | Ht 65.0 in | Wt 222.4 lb

## 2016-08-09 DIAGNOSIS — I5032 Chronic diastolic (congestive) heart failure: Secondary | ICD-10-CM

## 2016-08-09 DIAGNOSIS — I10 Essential (primary) hypertension: Secondary | ICD-10-CM

## 2016-08-09 DIAGNOSIS — E7800 Pure hypercholesterolemia, unspecified: Secondary | ICD-10-CM | POA: Diagnosis not present

## 2016-08-09 DIAGNOSIS — I25810 Atherosclerosis of coronary artery bypass graft(s) without angina pectoris: Secondary | ICD-10-CM

## 2016-08-09 NOTE — Patient Instructions (Signed)

## 2016-08-18 DIAGNOSIS — A084 Viral intestinal infection, unspecified: Secondary | ICD-10-CM | POA: Diagnosis not present

## 2016-08-18 DIAGNOSIS — Z6838 Body mass index (BMI) 38.0-38.9, adult: Secondary | ICD-10-CM | POA: Diagnosis not present

## 2017-02-01 DIAGNOSIS — E785 Hyperlipidemia, unspecified: Secondary | ICD-10-CM | POA: Diagnosis not present

## 2017-02-01 DIAGNOSIS — I5032 Chronic diastolic (congestive) heart failure: Secondary | ICD-10-CM | POA: Diagnosis not present

## 2017-02-01 DIAGNOSIS — Z6838 Body mass index (BMI) 38.0-38.9, adult: Secondary | ICD-10-CM | POA: Diagnosis not present

## 2017-02-01 DIAGNOSIS — G4733 Obstructive sleep apnea (adult) (pediatric): Secondary | ICD-10-CM | POA: Diagnosis not present

## 2017-02-01 DIAGNOSIS — H6123 Impacted cerumen, bilateral: Secondary | ICD-10-CM | POA: Diagnosis not present

## 2017-02-01 DIAGNOSIS — E038 Other specified hypothyroidism: Secondary | ICD-10-CM | POA: Diagnosis not present

## 2017-02-01 DIAGNOSIS — I25810 Atherosclerosis of coronary artery bypass graft(s) without angina pectoris: Secondary | ICD-10-CM | POA: Diagnosis not present

## 2017-02-01 DIAGNOSIS — Z139 Encounter for screening, unspecified: Secondary | ICD-10-CM | POA: Diagnosis not present

## 2017-02-01 DIAGNOSIS — Z79899 Other long term (current) drug therapy: Secondary | ICD-10-CM | POA: Diagnosis not present

## 2017-02-01 DIAGNOSIS — Z9181 History of falling: Secondary | ICD-10-CM | POA: Diagnosis not present

## 2017-02-11 NOTE — Progress Notes (Signed)
Chief Complaint  Patient presents with  . Leg Swelling    History of Present Illness: 83 yo white male with history of CAD, COPD, OSA, HLD, venous insufficiency who is here today for cardiac follow up. In 2004 he had a 4V CABG. (LIMA to LAD, SVG to intermediate, SVG to circumflex marginal, SVG to PDA). Stress myoview August 2015 with no ischemia. Echo November 2013 with normal LV size and function.   He is here today for follow up. The patient denies any chest pain, dyspnea, palpitations, lower extremity edema, orthopnea, PND, dizziness, near syncope or syncope. He has been less active. Left leg hurts when walking.   Primary Care Physician: Paulina Fusi, MD   Past Medical History:  Diagnosis Date  . Abnormal involuntary movements(781.0)   . CAD (coronary artery disease)    with 4V CABG 2004  . Chronic airway obstruction, not elsewhere classified   . Nephrolithiasis   . Obstructive sleep apnea (adult) (pediatric)   . Venous insufficiency     Past Surgical History:  Procedure Laterality Date  . CORONARY ARTERY BYPASS GRAFT  2004    Current Outpatient Prescriptions  Medication Sig Dispense Refill  . atorvastatin (LIPITOR) 40 MG tablet Take 40 mg by mouth daily.    . bimatoprost (LUMIGAN) 0.03 % ophthalmic solution Place 1 drop into both eyes at bedtime.     . FOLTABS 800 800-10-115 MCG-MG-MCG TABS Take 1 tablet by mouth daily.  12  . furosemide (LASIX) 20 MG tablet TAKE ONE TABLET BY MOUTH ONCE DAILY 30 tablet 1  . levothyroxine (SYNTHROID, LEVOTHROID) 125 MCG tablet Take 125 mcg by mouth daily.    . primidone (MYSOLINE) 250 MG tablet Take 125 mg by mouth 3 (three) times daily. Take 1/2 tab three times a day     . propranolol (INDERAL) 20 MG tablet Take 20 mg by mouth 3 (three) times daily.    Marland Kitchen aspirin EC 81 MG tablet Take 1 tablet (81 mg total) by mouth daily. 90 tablet 3   No current facility-administered medications for this visit.     No Known  Allergies  Social History   Social History  . Marital status: Married    Spouse name: N/A  . Number of children: 1  . Years of education: N/A   Occupational History  . Retired-welder    Social History Main Topics  . Smoking status: Former Smoker    Packs/day: 0.50    Years: 20.00    Types: Cigarettes    Quit date: 08/21/2002  . Smokeless tobacco: Not on file     Comment: pt stopped smoking after his bypass  . Alcohol use No  . Drug use: No  . Sexual activity: Not on file   Other Topics Concern  . Not on file   Social History Narrative  . No narrative on file    Family History  Problem Relation Age of Onset  . Hypertension Mother   . CAD Brother   . Valvular heart disease Brother   . Hypertension Brother   . Heart attack Neg Hx   . Stroke Neg Hx     Review of Systems:  As stated in the HPI and otherwise negative.   BP 124/62   Pulse 64   Ht 5\' 5"  (1.651 m)   Wt 221 lb (100.2 kg)   BMI 36.78 kg/m   Physical Examination:  General: Well developed, well nourished, NAD  HEENT: OP clear, mucus membranes moist  SKIN: warm, dry. No rashes. Neuro: No focal deficits  Musculoskeletal: Muscle strength 5/5 all ext  Psychiatric: Mood and affect normal  Neck: No JVD, no carotid bruits, no thyromegaly, no lymphadenopathy.  Lungs:Clear bilaterally with end expiratory wheezes, no crackles. Cardiovascular: Regular rate and rhythm. No murmurs, gallops or rubs. Abdomen:Soft. Bowel sounds present. Non-tender.  Extremities: No lower extremity edema. Pulses are 2 + in the bilateral DP/PT.  Echo 06/26/12: Left ventricle: The cavity size was normal. Systolic function was normal. The estimated ejection fraction was in the range of 55% to 60%. Wall motion was normal; there were no regional wall motion abnormalities.  EKG:  EKG is ordered today. The ekg ordered today demonstrates    Recent Labs: No results found for requested labs within last 8760 hours.   Lipid Panel No  results found for: CHOL, TRIG, HDL, CHOLHDL, VLDL, LDLCALC, LDLDIRECT   Wt Readings from Last 3 Encounters:  02/12/17 221 lb (100.2 kg)  08/09/16 222 lb 6.4 oz (100.9 kg)  01/24/16 216 lb 12.8 oz (98.3 kg)     Other studies Reviewed: Additional studies/ records that were reviewed today include: . Review of the above records demonstrates:   Assessment and Plan:   1. CAD without angina: He is doing well and having no chest pain worrisome for angina. Will continue ASA, statin and beta blocker. He is on propranolol for tremors and CAD.  Will arrange echo to assess LV function if he agrees. He is refusing today.   2. HTN: BP controlled. No changes  3. HLD: Continue statin. Lipids followed in primary care.   4. Chronic diastolic CHF: Volume stable. He is not good about taking Lasix.   5. OSA: followed in primary care. He does not wear his CPAP.   6. Left leg pain: I have offered an arterial doppler but he refuses.   He is very disagreeable to testing.  Current medicines are reviewed at length with the patient today.  The patient does not have concerns regarding medicines.  The following changes have been made:  no change  Labs/ tests ordered today include:   No orders of the defined types were placed in this encounter.   Disposition:   FU with me in 6 months  Signed, Verne Carrowhristopher Jyquan Kenley, MD 02/12/2017 4:48 PM    [facility] [address], [place], [place]  [address] Phone: [phone]; Fax: [phone]) [phone]

## 2017-02-12 ENCOUNTER — Ambulatory Visit (INDEPENDENT_AMBULATORY_CARE_PROVIDER_SITE_OTHER): Payer: PPO | Admitting: Cardiovascular Disease

## 2017-02-12 VITALS — BP 124/62 | HR 64 | Ht 65.0 in | Wt 221.0 lb

## 2017-02-12 DIAGNOSIS — I25810 Atherosclerosis of coronary artery bypass graft(s) without angina pectoris: Secondary | ICD-10-CM

## 2017-02-12 DIAGNOSIS — I10 Essential (primary) hypertension: Secondary | ICD-10-CM

## 2017-02-12 DIAGNOSIS — E7800 Pure hypercholesterolemia, unspecified: Secondary | ICD-10-CM

## 2017-02-12 DIAGNOSIS — I5032 Chronic diastolic (congestive) heart failure: Secondary | ICD-10-CM

## 2017-02-12 MED ORDER — ASPIRIN EC 81 MG PO TBEC: 81.0000 mg | DELAYED_RELEASE_TABLET | Freq: Every day | ORAL | 3 refills | Status: DC

## 2017-02-12 NOTE — Patient Instructions (Signed)
Medication Instructions:  Your physician has recommended you make the following change in your medication: Decrease aspirin to 81 mg by mouth daily   Labwork: none  Testing/Procedures: none  Follow-Up: Your physician recommends that you schedule a follow-up appointment in: 6 months. Please call our office in about 3 months to schedule this appointment.    Any Other Special Instructions Will Be Listed Below (If Applicable).   Call us if you would like to schedule the study on your legs and heart ultrasound  If you need a refill on your cardiac medications before your next appointment, please call your pharmacy.

## 2017-03-15 DIAGNOSIS — Z136 Encounter for screening for cardiovascular disorders: Secondary | ICD-10-CM | POA: Diagnosis not present

## 2017-03-15 DIAGNOSIS — Z6838 Body mass index (BMI) 38.0-38.9, adult: Secondary | ICD-10-CM | POA: Diagnosis not present

## 2017-03-15 DIAGNOSIS — Z125 Encounter for screening for malignant neoplasm of prostate: Secondary | ICD-10-CM | POA: Diagnosis not present

## 2017-03-15 DIAGNOSIS — Z9181 History of falling: Secondary | ICD-10-CM | POA: Diagnosis not present

## 2017-03-15 DIAGNOSIS — E785 Hyperlipidemia, unspecified: Secondary | ICD-10-CM | POA: Diagnosis not present

## 2017-03-15 DIAGNOSIS — Z1389 Encounter for screening for other disorder: Secondary | ICD-10-CM | POA: Diagnosis not present

## 2017-03-15 DIAGNOSIS — Z0000 Encounter for general adult medical examination without abnormal findings: Secondary | ICD-10-CM | POA: Diagnosis not present

## 2017-05-27 DIAGNOSIS — B029 Zoster without complications: Secondary | ICD-10-CM | POA: Diagnosis not present

## 2017-05-27 DIAGNOSIS — J189 Pneumonia, unspecified organism: Secondary | ICD-10-CM | POA: Diagnosis not present

## 2017-05-28 DIAGNOSIS — B029 Zoster without complications: Secondary | ICD-10-CM | POA: Diagnosis not present

## 2017-05-28 DIAGNOSIS — Z6837 Body mass index (BMI) 37.0-37.9, adult: Secondary | ICD-10-CM | POA: Diagnosis not present

## 2017-05-28 DIAGNOSIS — R079 Chest pain, unspecified: Secondary | ICD-10-CM | POA: Diagnosis not present

## 2017-05-28 DIAGNOSIS — J189 Pneumonia, unspecified organism: Secondary | ICD-10-CM | POA: Diagnosis not present

## 2017-06-19 DIAGNOSIS — B0229 Other postherpetic nervous system involvement: Secondary | ICD-10-CM | POA: Diagnosis not present

## 2017-06-19 DIAGNOSIS — B029 Zoster without complications: Secondary | ICD-10-CM | POA: Diagnosis not present

## 2017-06-19 DIAGNOSIS — Z6837 Body mass index (BMI) 37.0-37.9, adult: Secondary | ICD-10-CM | POA: Diagnosis not present

## 2017-08-03 ENCOUNTER — Ambulatory Visit: Payer: PPO | Admitting: Cardiovascular Disease

## 2017-08-06 DIAGNOSIS — I25810 Atherosclerosis of coronary artery bypass graft(s) without angina pectoris: Secondary | ICD-10-CM | POA: Diagnosis not present

## 2017-08-06 DIAGNOSIS — G4733 Obstructive sleep apnea (adult) (pediatric): Secondary | ICD-10-CM | POA: Diagnosis not present

## 2017-08-06 DIAGNOSIS — E785 Hyperlipidemia, unspecified: Secondary | ICD-10-CM | POA: Diagnosis not present

## 2017-08-06 DIAGNOSIS — Z23 Encounter for immunization: Secondary | ICD-10-CM | POA: Diagnosis not present

## 2017-08-06 DIAGNOSIS — Z6838 Body mass index (BMI) 38.0-38.9, adult: Secondary | ICD-10-CM | POA: Diagnosis not present

## 2017-08-06 DIAGNOSIS — I5032 Chronic diastolic (congestive) heart failure: Secondary | ICD-10-CM | POA: Diagnosis not present

## 2017-08-06 DIAGNOSIS — E038 Other specified hypothyroidism: Secondary | ICD-10-CM | POA: Diagnosis not present

## 2017-08-06 DIAGNOSIS — Z139 Encounter for screening, unspecified: Secondary | ICD-10-CM | POA: Diagnosis not present

## 2017-08-06 DIAGNOSIS — Z79899 Other long term (current) drug therapy: Secondary | ICD-10-CM | POA: Diagnosis not present

## 2017-08-29 DIAGNOSIS — H52203 Unspecified astigmatism, bilateral: Secondary | ICD-10-CM | POA: Diagnosis not present

## 2017-08-29 DIAGNOSIS — H2513 Age-related nuclear cataract, bilateral: Secondary | ICD-10-CM | POA: Diagnosis not present

## 2017-08-29 DIAGNOSIS — H524 Presbyopia: Secondary | ICD-10-CM | POA: Diagnosis not present

## 2017-08-29 DIAGNOSIS — H401132 Primary open-angle glaucoma, bilateral, moderate stage: Secondary | ICD-10-CM | POA: Diagnosis not present

## 2017-09-19 DIAGNOSIS — H401113 Primary open-angle glaucoma, right eye, severe stage: Secondary | ICD-10-CM | POA: Diagnosis not present

## 2017-09-19 DIAGNOSIS — Z83511 Family history of glaucoma: Secondary | ICD-10-CM | POA: Diagnosis not present

## 2017-09-19 DIAGNOSIS — H401122 Primary open-angle glaucoma, left eye, moderate stage: Secondary | ICD-10-CM | POA: Diagnosis not present

## 2017-09-25 DIAGNOSIS — H401124 Primary open-angle glaucoma, left eye, indeterminate stage: Secondary | ICD-10-CM | POA: Diagnosis not present

## 2017-09-25 DIAGNOSIS — H2513 Age-related nuclear cataract, bilateral: Secondary | ICD-10-CM | POA: Diagnosis not present

## 2017-09-25 DIAGNOSIS — H401113 Primary open-angle glaucoma, right eye, severe stage: Secondary | ICD-10-CM | POA: Diagnosis not present

## 2017-09-25 DIAGNOSIS — H01009 Unspecified blepharitis unspecified eye, unspecified eyelid: Secondary | ICD-10-CM | POA: Diagnosis not present

## 2017-10-01 DIAGNOSIS — M6281 Muscle weakness (generalized): Secondary | ICD-10-CM | POA: Diagnosis not present

## 2017-10-01 DIAGNOSIS — Z7982 Long term (current) use of aspirin: Secondary | ICD-10-CM | POA: Diagnosis not present

## 2017-10-01 DIAGNOSIS — R2689 Other abnormalities of gait and mobility: Secondary | ICD-10-CM | POA: Diagnosis not present

## 2017-10-01 DIAGNOSIS — I5032 Chronic diastolic (congestive) heart failure: Secondary | ICD-10-CM | POA: Diagnosis not present

## 2017-10-01 DIAGNOSIS — R296 Repeated falls: Secondary | ICD-10-CM | POA: Diagnosis not present

## 2017-10-01 DIAGNOSIS — Z9181 History of falling: Secondary | ICD-10-CM | POA: Diagnosis not present

## 2017-10-01 DIAGNOSIS — G4733 Obstructive sleep apnea (adult) (pediatric): Secondary | ICD-10-CM | POA: Diagnosis not present

## 2017-10-01 DIAGNOSIS — Z951 Presence of aortocoronary bypass graft: Secondary | ICD-10-CM | POA: Diagnosis not present

## 2017-10-01 DIAGNOSIS — F329 Major depressive disorder, single episode, unspecified: Secondary | ICD-10-CM | POA: Diagnosis not present

## 2017-10-01 DIAGNOSIS — M5030 Other cervical disc degeneration, unspecified cervical region: Secondary | ICD-10-CM | POA: Diagnosis not present

## 2017-10-01 DIAGNOSIS — M159 Polyosteoarthritis, unspecified: Secondary | ICD-10-CM | POA: Diagnosis not present

## 2017-10-01 DIAGNOSIS — J449 Chronic obstructive pulmonary disease, unspecified: Secondary | ICD-10-CM | POA: Diagnosis not present

## 2017-10-01 DIAGNOSIS — M5136 Other intervertebral disc degeneration, lumbar region: Secondary | ICD-10-CM | POA: Diagnosis not present

## 2017-10-01 DIAGNOSIS — I25810 Atherosclerosis of coronary artery bypass graft(s) without angina pectoris: Secondary | ICD-10-CM | POA: Diagnosis not present

## 2017-10-11 DIAGNOSIS — J180 Bronchopneumonia, unspecified organism: Secondary | ICD-10-CM | POA: Diagnosis not present

## 2017-10-11 DIAGNOSIS — R6889 Other general symptoms and signs: Secondary | ICD-10-CM | POA: Diagnosis not present

## 2017-10-11 DIAGNOSIS — Z6837 Body mass index (BMI) 37.0-37.9, adult: Secondary | ICD-10-CM | POA: Diagnosis not present

## 2017-11-01 ENCOUNTER — Encounter (INDEPENDENT_AMBULATORY_CARE_PROVIDER_SITE_OTHER): Payer: Self-pay

## 2017-11-01 ENCOUNTER — Encounter: Payer: Self-pay | Admitting: Cardiovascular Disease

## 2017-11-01 ENCOUNTER — Ambulatory Visit: Payer: PPO | Admitting: Cardiovascular Disease

## 2017-11-01 VITALS — BP 128/74 | HR 55 | Ht 65.0 in | Wt 215.0 lb

## 2017-11-01 DIAGNOSIS — I10 Essential (primary) hypertension: Secondary | ICD-10-CM | POA: Diagnosis not present

## 2017-11-01 DIAGNOSIS — I5032 Chronic diastolic (congestive) heart failure: Secondary | ICD-10-CM | POA: Diagnosis not present

## 2017-11-01 DIAGNOSIS — E7800 Pure hypercholesterolemia, unspecified: Secondary | ICD-10-CM

## 2017-11-01 DIAGNOSIS — I25810 Atherosclerosis of coronary artery bypass graft(s) without angina pectoris: Secondary | ICD-10-CM

## 2017-11-01 NOTE — Progress Notes (Signed)
Chief Complaint  Patient presents with  . Follow-up    CAD    History of Present Illness: 84 yo white male with history of CAD, COPD, OSA, HLD, venous insufficiency who is here today for cardiac follow up. In 2004 he had a 4V CABG. (LIMA to LAD, SVG to intermediate, SVG to circumflex marginal, SVG to PDA). Stress myoview August 2015 with no ischemia. Echo November 2013 with normal LV size and function. He had pneumonia in November 2018 and in February 2019.   He is here today for follow up. The patient denies any chest pain, dyspnea, palpitations, lower extremity edema, orthopnea, PND, dizziness, near syncope or syncope.   Primary Care Physician: Paulina Fusi, MD   Past Medical History:  Diagnosis Date  . Abnormal involuntary movements(781.0)   . CAD (coronary artery disease)    with 4V CABG 2004  . Chronic airway obstruction, not elsewhere classified   . Nephrolithiasis   . Obstructive sleep apnea (adult) (pediatric)   . Venous insufficiency     Past Surgical History:  Procedure Laterality Date  . CORONARY ARTERY BYPASS GRAFT  2004    Current Outpatient Medications  Medication Sig Dispense Refill  . albuterol (PROVENTIL) (2.5 MG/3ML) 0.083% nebulizer solution Take 2.5 mg by nebulization as needed.    Marland Kitchen aspirin EC 81 MG tablet Take 1 tablet (81 mg total) by mouth daily. 90 tablet 3  . atorvastatin (LIPITOR) 40 MG tablet Take 40 mg by mouth daily.    . bimatoprost (LUMIGAN) 0.03 % ophthalmic solution Place 1 drop into both eyes at bedtime.     . FOLTABS 800 800-10-115 MCG-MG-MCG TABS Take 1 tablet by mouth daily.  12  . furosemide (LASIX) 20 MG tablet TAKE ONE TABLET BY MOUTH ONCE DAILY 30 tablet 1  . levothyroxine (SYNTHROID, LEVOTHROID) 125 MCG tablet Take 125 mcg by mouth daily.    . primidone (MYSOLINE) 250 MG tablet Take 125 mg by mouth 3 (three) times daily. Take 1/2 tab three times a day     . propranolol (INDERAL) 20 MG tablet Take 20 mg by mouth 3 (three)  times daily.     No current facility-administered medications for this visit.     No Known Allergies  Social History   Socioeconomic History  . Marital status: Married    Spouse name: Not on file  . Number of children: 1  . Years of education: Not on file  . Highest education level: Not on file  Social Needs  . Financial resource strain: Not on file  . Food insecurity - worry: Not on file  . Food insecurity - inability: Not on file  . Transportation needs - medical: Not on file  . Transportation needs - non-medical: Not on file  Occupational History  . Occupation: Retired-welder  Tobacco Use  . Smoking status: Former Smoker    Packs/day: 0.50    Years: 20.00    Pack years: 10.00    Types: Cigarettes    Last attempt to quit: 08/21/2002    Years since quitting: 15.2  . Smokeless tobacco: Never Used  . Tobacco comment: pt stopped smoking after his bypass  Substance and Sexual Activity  . Alcohol use: No  . Drug use: No  . Sexual activity: Not on file  Other Topics Concern  . Not on file  Social History Narrative  . Not on file    Family History  Problem Relation Age of Onset  . Hypertension Mother   .  CAD Brother   . Valvular heart disease Brother   . Hypertension Brother   . Heart attack Neg Hx   . Stroke Neg Hx     Review of Systems:  As stated in the HPI and otherwise negative.   BP 128/74   Pulse (!) 55   Ht 5\' 5"  (1.651 m)   Wt 215 lb (97.5 kg)   SpO2 95%   BMI 35.78 kg/m   Physical Examination:  General: Well developed, well nourished, NAD  HEENT: OP clear, mucus membranes moist  SKIN: warm, dry. No rashes. Neuro: No focal deficits  Musculoskeletal: Muscle strength 5/5 all ext  Psychiatric: Mood and affect normal  Neck: No JVD, no carotid bruits, no thyromegaly, no lymphadenopathy.  Lungs:Clear bilaterally, no wheezes, rhonci, crackles Cardiovascular: Regular rate and rhythm. No murmurs, gallops or rubs. Abdomen:Soft. Bowel sounds present.  Non-tender.  Extremities: No lower extremity edema. Pulses are 2 + in the bilateral DP/PT.  Echo 06/26/12: Left ventricle: The cavity size was normal. Systolic function was normal. The estimated ejection fraction was in the range of 55% to 60%. Wall motion was normal; there were no regional wall motion abnormalities.  EKG:  EKG is ordered today. The ekg ordered today demonstrates  Sinus brady, rate 55 bpm.   Recent Labs: No results found for requested labs within last 8760 hours.   Lipid Panel No results found for: CHOL, TRIG, HDL, CHOLHDL, VLDL, LDLCALC, LDLDIRECT   Wt Readings from Last 3 Encounters:  11/01/17 215 lb (97.5 kg)  02/12/17 221 lb (100.2 kg)  08/09/16 222 lb 6.4 oz (100.9 kg)     Other studies Reviewed: Additional studies/ records that were reviewed today include: . Review of the above records demonstrates:   Assessment and Plan:   1. CAD without angina: No chest pain. Will continue ASA, statin and beta blocker. He has refused an echo to reassess LV function at prior visits.    2. HTN: BP controlled. No changes.   3. HLD: Lipids followed in primary care. Continue statin.   4. Chronic diastolic CHF: Volume status is ok today. Continue Lasix 20 mg daily.   5. OSA: Followed in primary care. Non-compliant with CPAP.   Current medicines are reviewed at length with the patient today.  The patient does not have concerns regarding medicines.  The following changes have been made:  no change  Labs/ tests ordered today include:   Orders Placed This Encounter  Procedures  . EKG 12-Lead    Disposition:   FU with me in 6 months  Signed, Verne Carrowhristopher Jeiden Daughtridge, MD 11/01/2017 4:50 PM    [facility] [address], [place], [place]  [address] Phone: [phone](336) [phone]; Fax: [phone]) [phone]

## 2017-11-01 NOTE — Patient Instructions (Signed)

## 2017-11-13 DIAGNOSIS — H401113 Primary open-angle glaucoma, right eye, severe stage: Secondary | ICD-10-CM | POA: Diagnosis not present

## 2017-11-13 DIAGNOSIS — H401124 Primary open-angle glaucoma, left eye, indeterminate stage: Secondary | ICD-10-CM | POA: Diagnosis not present

## 2017-11-13 DIAGNOSIS — H01009 Unspecified blepharitis unspecified eye, unspecified eyelid: Secondary | ICD-10-CM | POA: Diagnosis not present

## 2017-11-13 DIAGNOSIS — H269 Unspecified cataract: Secondary | ICD-10-CM | POA: Diagnosis not present

## 2018-01-15 DIAGNOSIS — J208 Acute bronchitis due to other specified organisms: Secondary | ICD-10-CM | POA: Diagnosis not present

## 2018-02-04 DIAGNOSIS — E785 Hyperlipidemia, unspecified: Secondary | ICD-10-CM | POA: Diagnosis not present

## 2018-02-04 DIAGNOSIS — G4733 Obstructive sleep apnea (adult) (pediatric): Secondary | ICD-10-CM | POA: Diagnosis not present

## 2018-02-04 DIAGNOSIS — I5032 Chronic diastolic (congestive) heart failure: Secondary | ICD-10-CM | POA: Diagnosis not present

## 2018-02-04 DIAGNOSIS — Z79899 Other long term (current) drug therapy: Secondary | ICD-10-CM | POA: Diagnosis not present

## 2018-02-04 DIAGNOSIS — E038 Other specified hypothyroidism: Secondary | ICD-10-CM | POA: Diagnosis not present

## 2018-02-04 DIAGNOSIS — Z6836 Body mass index (BMI) 36.0-36.9, adult: Secondary | ICD-10-CM | POA: Diagnosis not present

## 2018-02-04 DIAGNOSIS — Z1339 Encounter for screening examination for other mental health and behavioral disorders: Secondary | ICD-10-CM | POA: Diagnosis not present

## 2018-02-04 DIAGNOSIS — I25810 Atherosclerosis of coronary artery bypass graft(s) without angina pectoris: Secondary | ICD-10-CM | POA: Diagnosis not present

## 2018-03-26 DIAGNOSIS — H269 Unspecified cataract: Secondary | ICD-10-CM | POA: Diagnosis not present

## 2018-03-26 DIAGNOSIS — H01009 Unspecified blepharitis unspecified eye, unspecified eyelid: Secondary | ICD-10-CM | POA: Diagnosis not present

## 2018-03-26 DIAGNOSIS — H401124 Primary open-angle glaucoma, left eye, indeterminate stage: Secondary | ICD-10-CM | POA: Diagnosis not present

## 2018-03-26 DIAGNOSIS — H268 Other specified cataract: Secondary | ICD-10-CM | POA: Diagnosis not present

## 2018-03-26 DIAGNOSIS — Z79899 Other long term (current) drug therapy: Secondary | ICD-10-CM | POA: Diagnosis not present

## 2018-03-26 DIAGNOSIS — H401113 Primary open-angle glaucoma, right eye, severe stage: Secondary | ICD-10-CM | POA: Diagnosis not present

## 2018-05-22 ENCOUNTER — Ambulatory Visit (INDEPENDENT_AMBULATORY_CARE_PROVIDER_SITE_OTHER): Payer: PPO | Admitting: Cardiovascular Disease

## 2018-05-22 ENCOUNTER — Encounter: Payer: Self-pay | Admitting: Cardiovascular Disease

## 2018-05-22 VITALS — BP 98/78 | HR 54 | Ht 65.0 in | Wt 217.8 lb

## 2018-05-22 DIAGNOSIS — I25810 Atherosclerosis of coronary artery bypass graft(s) without angina pectoris: Secondary | ICD-10-CM

## 2018-05-22 DIAGNOSIS — E7800 Pure hypercholesterolemia, unspecified: Secondary | ICD-10-CM

## 2018-05-22 DIAGNOSIS — I5032 Chronic diastolic (congestive) heart failure: Secondary | ICD-10-CM

## 2018-05-22 DIAGNOSIS — I10 Essential (primary) hypertension: Secondary | ICD-10-CM | POA: Diagnosis not present

## 2018-05-22 NOTE — Patient Instructions (Signed)

## 2018-05-22 NOTE — Progress Notes (Signed)
Chief Complaint  Patient presents with  . Follow-up    CAD   History of Present Illness: 84 yo male with history of CAD s/p 4V CABG in 2004, COPD, OSA, HLD and venous insufficiency who is here today for cardiac follow up. In 2004 he had a 4V CABG. (LIMA to LAD, SVG to intermediate, SVG to circumflex marginal, SVG to PDA). Stress myoview August 2015 with no ischemia. Echo November 2013 with normal LV size and function. He has refused cardiac testing over the past few years.   He is here today for follow up. The patient denies any chest pain, dyspnea, palpitations, lower extremity edema, orthopnea, PND, dizziness, near syncope or syncope.   Primary Care Physician: Paulina Fusi, MD  Past Medical History:  Diagnosis Date  . Abnormal involuntary movements(781.0)   . CAD (coronary artery disease)    with 4V CABG 2004  . Chronic airway obstruction, not elsewhere classified   . Nephrolithiasis   . Obstructive sleep apnea (adult) (pediatric)   . Venous insufficiency     Past Surgical History:  Procedure Laterality Date  . CORONARY ARTERY BYPASS GRAFT  2004    Current Outpatient Medications  Medication Sig Dispense Refill  . albuterol (PROVENTIL) (2.5 MG/3ML) 0.083% nebulizer solution Take 2.5 mg by nebulization as needed.    Marland Kitchen aspirin EC 81 MG tablet Take 1 tablet (81 mg total) by mouth daily. 90 tablet 3  . atorvastatin (LIPITOR) 40 MG tablet Take 40 mg by mouth daily.    . bimatoprost (LUMIGAN) 0.03 % ophthalmic solution Place 1 drop into both eyes at bedtime.     . FOLTABS 800 800-10-115 MCG-MG-MCG TABS Take 1 tablet by mouth daily.  12  . furosemide (LASIX) 20 MG tablet TAKE ONE TABLET BY MOUTH ONCE DAILY 30 tablet 1  . levothyroxine (SYNTHROID, LEVOTHROID) 125 MCG tablet Take 125 mcg by mouth daily.    . primidone (MYSOLINE) 250 MG tablet Take 125 mg by mouth 3 (three) times daily. Take 1/2 tab three times a day     . propranolol (INDERAL) 20 MG tablet Take 20 mg by  mouth 3 (three) times daily.     No current facility-administered medications for this visit.     No Known Allergies  Social History   Socioeconomic History  . Marital status: Married    Spouse name: Not on file  . Number of children: 1  . Years of education: Not on file  . Highest education level: Not on file  Occupational History  . Occupation: Retired-welder  Social Needs  . Financial resource strain: Not on file  . Food insecurity:    Worry: Not on file    Inability: Not on file  . Transportation needs:    Medical: Not on file    Non-medical: Not on file  Tobacco Use  . Smoking status: Former Smoker    Packs/day: 0.50    Years: 20.00    Pack years: 10.00    Types: Cigarettes    Last attempt to quit: 08/21/2002    Years since quitting: 15.7  . Smokeless tobacco: Never Used  . Tobacco comment: pt stopped smoking after his bypass  Substance and Sexual Activity  . Alcohol use: No  . Drug use: No  . Sexual activity: Not on file  Lifestyle  . Physical activity:    Days per week: Not on file    Minutes per session: Not on file  . Stress: Not on file  Relationships  . Social connections:    Talks on phone: Not on file    Gets together: Not on file    Attends religious service: Not on file    Active member of club or organization: Not on file    Attends meetings of clubs or organizations: Not on file    Relationship status: Not on file  . Intimate partner violence:    Fear of current or ex partner: Not on file    Emotionally abused: Not on file    Physically abused: Not on file    Forced sexual activity: Not on file  Other Topics Concern  . Not on file  Social History Narrative  . Not on file    Family History  Problem Relation Age of Onset  . Hypertension Mother   . CAD Brother   . Valvular heart disease Brother   . Hypertension Brother   . Heart attack Neg Hx   . Stroke Neg Hx     Review of Systems:  As stated in the HPI and otherwise negative.    BP 98/78   Pulse (!) 54   Ht 5\' 5"  (1.651 m)   Wt 217 lb 12.8 oz (98.8 kg)   SpO2 97%   BMI 36.24 kg/m   Physical Examination: General: Well developed, well nourished, NAD  HEENT: OP clear, mucus membranes moist  SKIN: warm, dry. No rashes. Neuro: No focal deficits  Musculoskeletal: Muscle strength 5/5 all ext  Psychiatric: Mood and affect normal  Neck: No JVD, no carotid bruits, no thyromegaly, no lymphadenopathy.  Lungs:Clear bilaterally, no wheezes, rhonci, crackles Cardiovascular: Regular rate and rhythm. No murmurs, gallops or rubs. Abdomen:Soft. Bowel sounds present. Non-tender.  Extremities: No lower extremity edema. Pulses are 2 + in the bilateral DP/PT.  Echo 06/26/12: Left ventricle: The cavity size was normal. Systolic function was normal. The estimated ejection fraction was in the range of 55% to 60%. Wall motion was normal; there were no regional wall motion abnormalities.  EKG:  EKG is not ordered today. The ekg ordered today demonstrates     Recent Labs: No results found for requested labs within last 8760 hours.   Lipid Panel No results found for: CHOL, TRIG, HDL, CHOLHDL, VLDL, LDLCALC, LDLDIRECT   Wt Readings from Last 3 Encounters:  05/22/18 217 lb 12.8 oz (98.8 kg)  11/01/17 215 lb (97.5 kg)  02/12/17 221 lb (100.2 kg)     Other studies Reviewed: Additional studies/ records that were reviewed today include: . Review of the above records demonstrates:   Assessment and Plan:   1. CAD s/p CABG without angina: He is doing well. He has no chest pain. He has refused any cardiac testing. Will continue ASA, beta blocker and statin.   He is now agreeing to an echo to assess LV function  2. HTN: BP is controlled. No changes today  3. HLD: Lipids followed in primary care. Last LDL 61 in June 2019. Continue statin.   4. Chronic diastolic CHF: Weight is stable. No evidence of volume overload. Continue Lasix.   5. OSA: Followed in primary care.  Non-compliant with CPAP.   Current medicines are reviewed at length with the patient today.  The patient does not have concerns regarding medicines.  The following changes have been made:  no change  Labs/ tests ordered today include:   Orders Placed This Encounter  Procedures  . ECHOCARDIOGRAM COMPLETE    Disposition:   FU with me in 6 months  Signed, Verne Carrow, MD 05/22/2018 2:22 PM    [facility] [address] [place], [place], [place]  [address] Phone: [phone]; Fax: [phone]

## 2018-06-14 DIAGNOSIS — H1033 Unspecified acute conjunctivitis, bilateral: Secondary | ICD-10-CM | POA: Diagnosis not present

## 2018-06-27 ENCOUNTER — Other Ambulatory Visit (HOSPITAL_COMMUNITY): Payer: PPO

## 2018-07-05 DIAGNOSIS — Z23 Encounter for immunization: Secondary | ICD-10-CM | POA: Diagnosis not present

## 2018-07-19 ENCOUNTER — Encounter

## 2018-07-23 DIAGNOSIS — H0288B Meibomian gland dysfunction left eye, upper and lower eyelids: Secondary | ICD-10-CM | POA: Diagnosis not present

## 2018-07-23 DIAGNOSIS — H04123 Dry eye syndrome of bilateral lacrimal glands: Secondary | ICD-10-CM | POA: Diagnosis not present

## 2018-07-23 DIAGNOSIS — H0100B Unspecified blepharitis left eye, upper and lower eyelids: Secondary | ICD-10-CM | POA: Diagnosis not present

## 2018-07-23 DIAGNOSIS — H0100A Unspecified blepharitis right eye, upper and lower eyelids: Secondary | ICD-10-CM | POA: Diagnosis not present

## 2018-07-23 DIAGNOSIS — J208 Acute bronchitis due to other specified organisms: Secondary | ICD-10-CM | POA: Diagnosis not present

## 2018-07-23 DIAGNOSIS — H0288A Meibomian gland dysfunction right eye, upper and lower eyelids: Secondary | ICD-10-CM | POA: Diagnosis not present

## 2018-08-07 DIAGNOSIS — Z79899 Other long term (current) drug therapy: Secondary | ICD-10-CM | POA: Diagnosis not present

## 2018-08-07 DIAGNOSIS — I5032 Chronic diastolic (congestive) heart failure: Secondary | ICD-10-CM | POA: Diagnosis not present

## 2018-08-07 DIAGNOSIS — Z9181 History of falling: Secondary | ICD-10-CM | POA: Diagnosis not present

## 2018-08-07 DIAGNOSIS — Z1331 Encounter for screening for depression: Secondary | ICD-10-CM | POA: Diagnosis not present

## 2018-08-07 DIAGNOSIS — E785 Hyperlipidemia, unspecified: Secondary | ICD-10-CM | POA: Diagnosis not present

## 2018-08-07 DIAGNOSIS — I25810 Atherosclerosis of coronary artery bypass graft(s) without angina pectoris: Secondary | ICD-10-CM | POA: Diagnosis not present

## 2018-08-07 DIAGNOSIS — E038 Other specified hypothyroidism: Secondary | ICD-10-CM | POA: Diagnosis not present

## 2018-08-07 DIAGNOSIS — G4733 Obstructive sleep apnea (adult) (pediatric): Secondary | ICD-10-CM | POA: Diagnosis not present

## 2018-08-27 DIAGNOSIS — H401124 Primary open-angle glaucoma, left eye, indeterminate stage: Secondary | ICD-10-CM | POA: Diagnosis not present

## 2018-08-27 DIAGNOSIS — H2513 Age-related nuclear cataract, bilateral: Secondary | ICD-10-CM | POA: Diagnosis not present

## 2018-08-27 DIAGNOSIS — H0100B Unspecified blepharitis left eye, upper and lower eyelids: Secondary | ICD-10-CM | POA: Diagnosis not present

## 2018-08-27 DIAGNOSIS — H01002 Unspecified blepharitis right lower eyelid: Secondary | ICD-10-CM | POA: Diagnosis not present

## 2018-08-27 DIAGNOSIS — H01004 Unspecified blepharitis left upper eyelid: Secondary | ICD-10-CM | POA: Diagnosis not present

## 2018-08-27 DIAGNOSIS — H401113 Primary open-angle glaucoma, right eye, severe stage: Secondary | ICD-10-CM | POA: Diagnosis not present

## 2018-08-27 DIAGNOSIS — H109 Unspecified conjunctivitis: Secondary | ICD-10-CM | POA: Diagnosis not present

## 2018-08-27 DIAGNOSIS — H01005 Unspecified blepharitis left lower eyelid: Secondary | ICD-10-CM | POA: Diagnosis not present

## 2018-08-27 DIAGNOSIS — H01001 Unspecified blepharitis right upper eyelid: Secondary | ICD-10-CM | POA: Diagnosis not present

## 2018-08-27 DIAGNOSIS — H0100A Unspecified blepharitis right eye, upper and lower eyelids: Secondary | ICD-10-CM | POA: Diagnosis not present

## 2018-11-11 DIAGNOSIS — J301 Allergic rhinitis due to pollen: Secondary | ICD-10-CM | POA: Diagnosis not present

## 2018-11-11 DIAGNOSIS — H109 Unspecified conjunctivitis: Secondary | ICD-10-CM | POA: Diagnosis not present

## 2018-11-11 DIAGNOSIS — J449 Chronic obstructive pulmonary disease, unspecified: Secondary | ICD-10-CM | POA: Diagnosis not present

## 2018-12-12 ENCOUNTER — Ambulatory Visit: Payer: PPO | Admitting: Cardiovascular Disease

## 2019-02-28 DIAGNOSIS — E785 Hyperlipidemia, unspecified: Secondary | ICD-10-CM | POA: Diagnosis not present

## 2019-02-28 DIAGNOSIS — I25810 Atherosclerosis of coronary artery bypass graft(s) without angina pectoris: Secondary | ICD-10-CM | POA: Diagnosis not present

## 2019-02-28 DIAGNOSIS — Z79899 Other long term (current) drug therapy: Secondary | ICD-10-CM | POA: Diagnosis not present

## 2019-02-28 DIAGNOSIS — Z139 Encounter for screening, unspecified: Secondary | ICD-10-CM | POA: Diagnosis not present

## 2019-02-28 DIAGNOSIS — G4733 Obstructive sleep apnea (adult) (pediatric): Secondary | ICD-10-CM | POA: Diagnosis not present

## 2019-02-28 DIAGNOSIS — H6123 Impacted cerumen, bilateral: Secondary | ICD-10-CM | POA: Diagnosis not present

## 2019-02-28 DIAGNOSIS — E038 Other specified hypothyroidism: Secondary | ICD-10-CM | POA: Diagnosis not present

## 2019-02-28 DIAGNOSIS — I5032 Chronic diastolic (congestive) heart failure: Secondary | ICD-10-CM | POA: Diagnosis not present

## 2019-04-23 NOTE — Progress Notes (Signed)
Chief Complaint  Patient presents with  . Follow-up    CAD   History of Present Illness: 85 yo male with history of CAD s/p 4V CABG in 2004, COPD, OSA, HLD and venous insufficiency who is here today for cardiac follow up. In 2004 he had a 4V CABG. (LIMA to LAD, SVG to intermediate, SVG to circumflex marginal, SVG to PDA). Stress myoview August 2015 with no ischemia. Echo November 2013 with normal LV size and function. He has refused cardiac testing over the past few years.   He is here today for follow up. The patient denies any chest pain, dyspnea, palpitations, orthopnea, PND, dizziness, near syncope or syncope. Mild LE edema.   Primary Care Physician: Paulina FusiSchultz, Douglas E, MD  Past Medical History:  Diagnosis Date  . Abnormal involuntary movements(781.0)   . CAD (coronary artery disease)    with 4V CABG 2004  . Chronic airway obstruction, not elsewhere classified   . Nephrolithiasis   . Obstructive sleep apnea (adult) (pediatric)   . Venous insufficiency     Past Surgical History:  Procedure Laterality Date  . CORONARY ARTERY BYPASS GRAFT  2004    Current Outpatient Medications  Medication Sig Dispense Refill  . albuterol (PROVENTIL) (2.5 MG/3ML) 0.083% nebulizer solution Take 2.5 mg by nebulization as needed.    Marland Kitchen. aspirin EC 81 MG tablet Take 1 tablet (81 mg total) by mouth daily. 90 tablet 3  . atorvastatin (LIPITOR) 40 MG tablet Take 40 mg by mouth daily.    . bimatoprost (LUMIGAN) 0.03 % ophthalmic solution Place 1 drop into both eyes at bedtime.     . FOLTABS 800 800-10-115 MCG-MG-MCG TABS Take 1 tablet by mouth daily.  12  . furosemide (LASIX) 20 MG tablet TAKE ONE TABLET BY MOUTH ONCE DAILY 30 tablet 1  . levothyroxine (SYNTHROID, LEVOTHROID) 125 MCG tablet Take 125 mcg by mouth daily.    . primidone (MYSOLINE) 250 MG tablet Take 125 mg by mouth 3 (three) times daily. Take 1/2 tab three times a day     . propranolol (INDERAL) 20 MG tablet Take 20 mg by mouth 3  (three) times daily.     No current facility-administered medications for this visit.     No Known Allergies  Social History   Socioeconomic History  . Marital status: Married    Spouse name: Not on file  . Number of children: 1  . Years of education: Not on file  . Highest education level: Not on file  Occupational History  . Occupation: Retired-welder  Social Needs  . Financial resource strain: Not on file  . Food insecurity    Worry: Not on file    Inability: Not on file  . Transportation needs    Medical: Not on file    Non-medical: Not on file  Tobacco Use  . Smoking status: Former Smoker    Packs/day: 0.50    Years: 20.00    Pack years: 10.00    Types: Cigarettes    Quit date: 08/21/2002    Years since quitting: 16.6  . Smokeless tobacco: Never Used  . Tobacco comment: pt stopped smoking after his bypass  Substance and Sexual Activity  . Alcohol use: No  . Drug use: No  . Sexual activity: Not on file  Lifestyle  . Physical activity    Days per week: Not on file    Minutes per session: Not on file  . Stress: Not on file  Relationships  .  Social Herbalist on phone: Not on file    Gets together: Not on file    Attends religious service: Not on file    Active member of club or organization: Not on file    Attends meetings of clubs or organizations: Not on file    Relationship status: Not on file  . Intimate partner violence    Fear of current or ex partner: Not on file    Emotionally abused: Not on file    Physically abused: Not on file    Forced sexual activity: Not on file  Other Topics Concern  . Not on file  Social History Narrative  . Not on file    Family History  Problem Relation Age of Onset  . Hypertension Mother   . CAD Brother   . Valvular heart disease Brother   . Hypertension Brother   . Heart attack Neg Hx   . Stroke Neg Hx     Review of Systems:  As stated in the HPI and otherwise negative.   BP (!) 152/80   Pulse  64   Ht 5\' 5"  (1.651 m)   Wt 223 lb (101.2 kg)   SpO2 97%   BMI 37.11 kg/m   Physical Examination: General: Well developed, well nourished, NAD  HEENT: OP clear, mucus membranes moist  SKIN: warm, dry. No rashes. Neuro: No focal deficits  Musculoskeletal: Muscle strength 5/5 all ext  Psychiatric: Mood and affect normal  Neck: No JVD, no carotid bruits, no thyromegaly, no lymphadenopathy.  Lungs:Clear bilaterally, no wheezes, rhonci, crackles Cardiovascular: Regular rate and rhythm. No murmurs, gallops or rubs. Abdomen:Soft. Bowel sounds present. Non-tender.  Extremities: No lower extremity edema. Pulses are 2 + in the bilateral DP/PT.  Echo 06/26/12: Left ventricle: The cavity size was normal. Systolic function was normal. The estimated ejection fraction was in the range of 55% to 60%. Wall motion was normal; there were no regional wall motion abnormalities.  EKG:  EKG is ordered today. The ekg ordered today demonstrates  NSR, rate 64 bpm. Incomplete RBBB. LAFB.   Recent Labs: No results found for requested labs within last 8760 hours.   Lipid Panel No results found for: CHOL, TRIG, HDL, CHOLHDL, VLDL, LDLCALC, LDLDIRECT   Wt Readings from Last 3 Encounters:  04/24/19 223 lb (101.2 kg)  05/22/18 217 lb 12.8 oz (98.8 kg)  11/01/17 215 lb (97.5 kg)     Other studies Reviewed: Additional studies/ records that were reviewed today include: . Review of the above records demonstrates:   Assessment and Plan:   1. CAD s/p CABG without angina: No chest pain. Continue ASA, statin and beta blocker. We arranged an echo at the last visit but he did not have this performed.   2. HTN: BP is well controlled. No changes today.   3. HLD: Lipids followed in primary care. Continue statin.   4. Chronic diastolic CHF: Slight increase in weight and LE edema. He will double his Lasix to 40 mg daily for 3 days. He has not been taking it every day.     5. OSA: Followed in primary care.  Non-compliant with CPAP.   Current medicines are reviewed at length with the patient today.  The patient does not have concerns regarding medicines.  The following changes have been made:  no change  Labs/ tests ordered today include:   Orders Placed This Encounter  Procedures  . EKG 12-Lead  . ECHOCARDIOGRAM COMPLETE    Disposition:  FU with me in 6 months  Signed, Verne Carrow, MD 04/24/2019 12:18 PM    [facility] [address] [place], [place], [place]  [address] Phone: [phone]; Fax: [phone]

## 2019-04-24 ENCOUNTER — Encounter: Payer: Self-pay | Admitting: Cardiovascular Disease

## 2019-04-24 ENCOUNTER — Other Ambulatory Visit: Payer: Self-pay

## 2019-04-24 ENCOUNTER — Ambulatory Visit (INDEPENDENT_AMBULATORY_CARE_PROVIDER_SITE_OTHER): Payer: PPO | Admitting: Cardiovascular Disease

## 2019-04-24 VITALS — BP 152/80 | HR 64 | Ht 65.0 in | Wt 223.0 lb

## 2019-04-24 DIAGNOSIS — I5032 Chronic diastolic (congestive) heart failure: Secondary | ICD-10-CM

## 2019-04-24 DIAGNOSIS — I10 Essential (primary) hypertension: Secondary | ICD-10-CM

## 2019-04-24 DIAGNOSIS — I25810 Atherosclerosis of coronary artery bypass graft(s) without angina pectoris: Secondary | ICD-10-CM

## 2019-04-24 DIAGNOSIS — E7800 Pure hypercholesterolemia, unspecified: Secondary | ICD-10-CM | POA: Diagnosis not present

## 2019-04-24 NOTE — Patient Instructions (Signed)
Medication Instructions:  1) Take an extra Furosemide (Lasix) for 3-4 days and then go back to normal dose.  If you need a refill on your cardiac medications before your next appointment, please call your pharmacy.   Lab work: None If you have labs (blood work) drawn today and your tests are completely normal, you will receive your results only by: Marland Kitchen MyChart Message (if you have MyChart) OR . A paper copy in the mail If you have any lab test that is abnormal or we need to change your treatment, we will call you to review the results.  Testing/Procedures: Your physician has requested that you have an echocardiogram. Echocardiography is a painless test that uses sound waves to create images of your heart. It provides your doctor with information about the size and shape of your heart and how well your heart's chambers and valves are working. This procedure takes approximately one hour. There are no restrictions for this procedure.  Follow-Up: At [facility], you and your health needs are our priority.  As part of our continuing mission to provide you with exceptional heart care, we have created designated Provider Care Teams.  These Care Teams include your primary Cardiologist (physician) and Advanced Practice Providers (APPs -  Physician Assistants and Nurse Practitioners) who all work together to provide you with the care you need, when you need it. You will need a follow up appointment in 12 months.  Please call our office 2 months in advance to schedule this appointment.  You may see Lauree Chandler, MD or one of the following Advanced Practice Providers on your designated Care Team:   Brielle, PA-C Melina Copa, PA-C . Ermalinda Barrios, PA-C  Any Other Special Instructions Will Be Listed Below (If Applicable).

## 2019-05-01 ENCOUNTER — Other Ambulatory Visit (HOSPITAL_COMMUNITY): Payer: PPO

## 2019-05-08 ENCOUNTER — Ambulatory Visit (HOSPITAL_COMMUNITY): Payer: PPO

## 2019-06-04 DIAGNOSIS — Z23 Encounter for immunization: Secondary | ICD-10-CM | POA: Diagnosis not present

## 2019-07-22 DIAGNOSIS — Z20828 Contact with and (suspected) exposure to other viral communicable diseases: Secondary | ICD-10-CM | POA: Diagnosis not present

## 2019-07-22 DIAGNOSIS — B9689 Other specified bacterial agents as the cause of diseases classified elsewhere: Secondary | ICD-10-CM | POA: Diagnosis not present

## 2019-07-22 DIAGNOSIS — R6889 Other general symptoms and signs: Secondary | ICD-10-CM | POA: Diagnosis not present

## 2019-07-22 DIAGNOSIS — J208 Acute bronchitis due to other specified organisms: Secondary | ICD-10-CM | POA: Diagnosis not present

## 2019-09-12 DIAGNOSIS — Z20822 Contact with and (suspected) exposure to covid-19: Secondary | ICD-10-CM | POA: Diagnosis not present

## 2019-09-12 DIAGNOSIS — R6889 Other general symptoms and signs: Secondary | ICD-10-CM | POA: Diagnosis not present

## 2019-10-29 DIAGNOSIS — B9689 Other specified bacterial agents as the cause of diseases classified elsewhere: Secondary | ICD-10-CM | POA: Diagnosis not present

## 2019-10-29 DIAGNOSIS — J208 Acute bronchitis due to other specified organisms: Secondary | ICD-10-CM | POA: Diagnosis not present

## 2020-01-14 DIAGNOSIS — Z79899 Other long term (current) drug therapy: Secondary | ICD-10-CM | POA: Diagnosis not present

## 2020-01-14 DIAGNOSIS — Z6837 Body mass index (BMI) 37.0-37.9, adult: Secondary | ICD-10-CM | POA: Diagnosis not present

## 2020-01-14 DIAGNOSIS — H6122 Impacted cerumen, left ear: Secondary | ICD-10-CM | POA: Diagnosis not present

## 2020-01-14 DIAGNOSIS — I5032 Chronic diastolic (congestive) heart failure: Secondary | ICD-10-CM | POA: Diagnosis not present

## 2020-01-14 DIAGNOSIS — Z9181 History of falling: Secondary | ICD-10-CM | POA: Diagnosis not present

## 2020-01-14 DIAGNOSIS — I25810 Atherosclerosis of coronary artery bypass graft(s) without angina pectoris: Secondary | ICD-10-CM | POA: Diagnosis not present

## 2020-01-14 DIAGNOSIS — E038 Other specified hypothyroidism: Secondary | ICD-10-CM | POA: Diagnosis not present

## 2020-01-14 DIAGNOSIS — E785 Hyperlipidemia, unspecified: Secondary | ICD-10-CM | POA: Diagnosis not present

## 2020-01-14 DIAGNOSIS — G4733 Obstructive sleep apnea (adult) (pediatric): Secondary | ICD-10-CM | POA: Diagnosis not present

## 2020-02-18 ENCOUNTER — Telehealth: Payer: Self-pay | Admitting: Cardiovascular Disease

## 2020-02-18 DIAGNOSIS — I5033 Acute on chronic diastolic (congestive) heart failure: Secondary | ICD-10-CM | POA: Diagnosis not present

## 2020-02-18 DIAGNOSIS — R296 Repeated falls: Secondary | ICD-10-CM | POA: Diagnosis not present

## 2020-02-18 DIAGNOSIS — Z79899 Other long term (current) drug therapy: Secondary | ICD-10-CM

## 2020-02-18 NOTE — Telephone Encounter (Signed)
Spoke with Billy Smith, patients daughter who said patients PCP suggested he reach out to cardiologist because of his SOB, 10 pound weight gain in 3 weeks as well as some wheezing. Recent BP was 117/78, HR -unknown. Spoke with Dr. Clifton James who advised patient to increase Lasix to 40mg  BID, have BMET drawn on Friday 7/2 and made OV appt with CM, MD for Friday 7/9. Pts daughter has requested that labs be drawn by [facility]. [name] is aware of the signs or symptoms that warrant a call to EMS versus concerns that warrant a call back to our office. She is agreeable to follow through with current plan.

## 2020-02-18 NOTE — Telephone Encounter (Signed)
Pt c/o swelling: STAT is pt has developed SOB within 24 hours  How much weight have you gained and in what time span? 14lbs in two weeks  1) If swelling, where is the swelling located? Feet, legs, and arms. Went to his PCP and his doctor said he had fluid in his lungs and chest.   2) Are you currently taking a fluid pill? yes  3) Are you currently SOB? Yes   4) Do you have a log of your daily weights (if so, list)? No   5) Have you gained 3 pounds in a day or 5 pounds in a week? Unsure   6) Have you traveled recently? No

## 2020-02-19 NOTE — Telephone Encounter (Signed)
Called back to [facility] and informed the patient's PCP will be requesting in home services to start again.  I have faxed the BMET order to them, attn Enrique Sack, [phone] so that labs can be drawn when care is started with results to be faxed to Dr. Clifton James at [phone].

## 2020-02-19 NOTE — Telephone Encounter (Signed)
Follow up  Pt's daughter called back, she said pt's pcp is send and order to [facility] to continue's pt home health care, she wanted to let Michalene know she can send the order to them for blood work

## 2020-02-19 NOTE — Telephone Encounter (Signed)
Regarding labs due to be drawn 02/20/20:  [facility] in [place]  [phone]. The patient has not been on their service since 2019.  I called and left VM for patient's daughter to call back.  If he has a different home care service seeing him in the home we can contact them.  Otherwise, home care will not be able to draw his labs and he would need to go to [facility] or our office.

## 2020-02-20 NOTE — Telephone Encounter (Signed)
Follow up  Billy Smith from [facility] called, she said she did not received order for lab work and requested if it can resend it to her. Fax# [phone]

## 2020-02-20 NOTE — Telephone Encounter (Signed)
Faxed orders for BMET to [facility] Attn: Enrique Sack. Confirmation received that the fax was successful. Called and made them aware.

## 2020-02-21 DIAGNOSIS — E038 Other specified hypothyroidism: Secondary | ICD-10-CM | POA: Diagnosis not present

## 2020-02-21 DIAGNOSIS — R296 Repeated falls: Secondary | ICD-10-CM | POA: Diagnosis not present

## 2020-02-21 DIAGNOSIS — R2689 Other abnormalities of gait and mobility: Secondary | ICD-10-CM | POA: Diagnosis not present

## 2020-02-21 DIAGNOSIS — F329 Major depressive disorder, single episode, unspecified: Secondary | ICD-10-CM | POA: Diagnosis not present

## 2020-02-21 DIAGNOSIS — G250 Essential tremor: Secondary | ICD-10-CM | POA: Diagnosis not present

## 2020-02-21 DIAGNOSIS — Z87440 Personal history of urinary (tract) infections: Secondary | ICD-10-CM | POA: Diagnosis not present

## 2020-02-21 DIAGNOSIS — G4733 Obstructive sleep apnea (adult) (pediatric): Secondary | ICD-10-CM | POA: Diagnosis not present

## 2020-02-21 DIAGNOSIS — I5033 Acute on chronic diastolic (congestive) heart failure: Secondary | ICD-10-CM | POA: Diagnosis not present

## 2020-02-21 DIAGNOSIS — I5032 Chronic diastolic (congestive) heart failure: Secondary | ICD-10-CM | POA: Diagnosis not present

## 2020-02-21 DIAGNOSIS — H409 Unspecified glaucoma: Secondary | ICD-10-CM | POA: Diagnosis not present

## 2020-02-21 DIAGNOSIS — M159 Polyosteoarthritis, unspecified: Secondary | ICD-10-CM | POA: Diagnosis not present

## 2020-02-21 DIAGNOSIS — Z9181 History of falling: Secondary | ICD-10-CM | POA: Diagnosis not present

## 2020-02-21 DIAGNOSIS — M5440 Lumbago with sciatica, unspecified side: Secondary | ICD-10-CM | POA: Diagnosis not present

## 2020-02-21 DIAGNOSIS — I25810 Atherosclerosis of coronary artery bypass graft(s) without angina pectoris: Secondary | ICD-10-CM | POA: Diagnosis not present

## 2020-02-21 DIAGNOSIS — M5030 Other cervical disc degeneration, unspecified cervical region: Secondary | ICD-10-CM | POA: Diagnosis not present

## 2020-02-21 DIAGNOSIS — Z951 Presence of aortocoronary bypass graft: Secondary | ICD-10-CM | POA: Diagnosis not present

## 2020-02-21 DIAGNOSIS — M6281 Muscle weakness (generalized): Secondary | ICD-10-CM | POA: Diagnosis not present

## 2020-02-21 DIAGNOSIS — J449 Chronic obstructive pulmonary disease, unspecified: Secondary | ICD-10-CM | POA: Diagnosis not present

## 2020-02-21 DIAGNOSIS — Z7982 Long term (current) use of aspirin: Secondary | ICD-10-CM | POA: Diagnosis not present

## 2020-02-21 DIAGNOSIS — M5136 Other intervertebral disc degeneration, lumbar region: Secondary | ICD-10-CM | POA: Diagnosis not present

## 2020-02-25 DIAGNOSIS — I5033 Acute on chronic diastolic (congestive) heart failure: Secondary | ICD-10-CM | POA: Diagnosis not present

## 2020-02-25 DIAGNOSIS — I5032 Chronic diastolic (congestive) heart failure: Secondary | ICD-10-CM | POA: Diagnosis not present

## 2020-02-25 DIAGNOSIS — M6281 Muscle weakness (generalized): Secondary | ICD-10-CM | POA: Diagnosis not present

## 2020-02-27 ENCOUNTER — Ambulatory Visit: Payer: PPO | Admitting: Cardiovascular Disease

## 2020-02-27 ENCOUNTER — Encounter: Payer: Self-pay | Admitting: Cardiovascular Disease

## 2020-02-27 ENCOUNTER — Ambulatory Visit
Admission: RE | Admit: 2020-02-27 | Discharge: 2020-02-27 | Disposition: A | Discharge status: Home / Self Care | Payer: PPO | Source: Ambulatory Visit | Attending: Cardiovascular Disease | Admitting: Cardiovascular Disease

## 2020-02-27 ENCOUNTER — Other Ambulatory Visit: Payer: Self-pay

## 2020-02-27 VITALS — BP 120/68 | HR 60 | Ht 65.0 in | Wt 222.0 lb

## 2020-02-27 DIAGNOSIS — I10 Essential (primary) hypertension: Secondary | ICD-10-CM | POA: Diagnosis not present

## 2020-02-27 DIAGNOSIS — E7800 Pure hypercholesterolemia, unspecified: Secondary | ICD-10-CM | POA: Diagnosis not present

## 2020-02-27 DIAGNOSIS — I25810 Atherosclerosis of coronary artery bypass graft(s) without angina pectoris: Secondary | ICD-10-CM | POA: Diagnosis not present

## 2020-02-27 DIAGNOSIS — I5033 Acute on chronic diastolic (congestive) heart failure: Secondary | ICD-10-CM | POA: Diagnosis not present

## 2020-02-27 DIAGNOSIS — R0602 Shortness of breath: Secondary | ICD-10-CM | POA: Diagnosis not present

## 2020-02-27 MED ORDER — FUROSEMIDE 40 MG PO TABS: ORAL_TABLET | ORAL | 0 refills | Status: DC

## 2020-02-27 NOTE — Progress Notes (Signed)
Chief Complaint  Patient presents with  . Follow-up    Weight gain, dyspnea   History of Present Illness: 86 yo male with history of CAD s/p 4V CABG in 2004, chronic diastolic CHF, COPD, OSA, HLD and venous insufficiency who is here today for cardiac follow up. In 2004 he had a 4V CABG. (LIMA to LAD, SVG to intermediate, SVG to circumflex marginal, SVG to PDA). Stress myoview August 2015 with no ischemia. Echo November 2013 with normal LV size and function. He has refused cardiac testing over the past few years. At his last visit here in September 2020 we ordered an echo but he did not complete this test.   He is here today for follow up. The patient denies any chest pain, palpitations, dizziness, near syncope or syncope. His family called into the office last week and reported 10 lb weight gain with associated dyspnea and wheezing. Lasix was increased to 40 mg po BID. He has lost 5 lbs but still has some LE edema and wheezing. He says he feels great. His wife and daughter say he feels terrible. He is very inactive. BMET per home health this week with creatinine 0.8, K 4.3. BUN 20.     Primary Care Physician: Paulina Fusi, MD  Past Medical History:  Diagnosis Date  . Abnormal involuntary movements(781.0)   . CAD (coronary artery disease)    with 4V CABG 2004  . Chronic airway obstruction, not elsewhere classified   . Nephrolithiasis   . Obstructive sleep apnea (adult) (pediatric)   . Venous insufficiency     Past Surgical History:  Procedure Laterality Date  . CORONARY ARTERY BYPASS GRAFT  2004    Current Outpatient Medications  Medication Sig Dispense Refill  . albuterol (PROVENTIL) (2.5 MG/3ML) 0.083% nebulizer solution Take 2.5 mg by nebulization as needed.    Marland Kitchen ascorbic acid (VITAMIN C) 500 MG tablet Take 500 mg by mouth daily.    Marland Kitchen aspirin EC 81 MG tablet Take 1 tablet (81 mg total) by mouth daily. 90 tablet 3  . atorvastatin (LIPITOR) 40 MG tablet Take 40 mg by  mouth daily.    . bimatoprost (LUMIGAN) 0.03 % ophthalmic solution Place 1 drop into both eyes at bedtime.     . brimonidine (ALPHAGAN) 0.15 % ophthalmic solution Place 1 drop into the right eye at bedtime.    . Cholecalciferol (VITAMIN D) 50 MCG (2000 UT) CAPS Take 1 capsule by mouth daily.    . dorzolamide-timolol (COSOPT) 22.3-6.8 MG/ML ophthalmic solution Place 1 drop into the right eye daily.    . FOLTABS 800 800-10-115 MCG-MG-MCG TABS Take 1 tablet by mouth daily.  12  . furosemide (LASIX) 40 MG tablet Take 40 mg by mouth 2 (two) times daily.    Marland Kitchen levothyroxine (SYNTHROID, LEVOTHROID) 125 MCG tablet Take 125 mcg by mouth daily.    . Multiple Vitamins-Minerals (CENTRUM SILVER 50+MEN PO) Take 1 tablet by mouth daily.    . primidone (MYSOLINE) 250 MG tablet Take 125 mg by mouth 3 (three) times daily. Take 1/2 tab three times a day     . propranolol (INDERAL) 20 MG tablet Take 20 mg by mouth 3 (three) times daily.     No current facility-administered medications for this visit.    No Known Allergies  Social History   Socioeconomic History  . Marital status: Married    Spouse name: Not on file  . Number of children: 1  . Years of education:  Not on file  . Highest education level: Not on file  Occupational History  . Occupation: Retired-welder  Tobacco Use  . Smoking status: Former Smoker    Packs/day: 0.50    Years: 20.00    Pack years: 10.00    Types: Cigarettes    Quit date: 08/21/2002    Years since quitting: 17.5  . Smokeless tobacco: Never Used  . Tobacco comment: pt stopped smoking after his bypass  Substance and Sexual Activity  . Alcohol use: No  . Drug use: No  . Sexual activity: Not on file  Other Topics Concern  . Not on file  Social History Narrative  . Not on file   Social Determinants of Health   Financial Resource Strain:   . Difficulty of Paying Living Expenses:   Food Insecurity:   . Worried About Programme researcher, broadcasting/film/video in the Last Year:   . Engineer, site in the Last Year:   Transportation Needs:   . Freight forwarder (Medical):   Marland Kitchen Lack of Transportation (Non-Medical):   Physical Activity:   . Days of Exercise per Week:   . Minutes of Exercise per Session:   Stress:   . Feeling of Stress :   Social Connections:   . Frequency of Communication with Friends and Family:   . Frequency of Social Gatherings with Friends and Family:   . Attends Religious Services:   . Active Member of Clubs or Organizations:   . Attends Banker Meetings:   Marland Kitchen Marital Status:   Intimate Partner Violence:   . Fear of Current or Ex-Partner:   . Emotionally Abused:   Marland Kitchen Physically Abused:   . Sexually Abused:     Family History  Problem Relation Age of Onset  . Hypertension Mother   . CAD Brother   . Valvular heart disease Brother   . Hypertension Brother   . Heart attack Neg Hx   . Stroke Neg Hx     Review of Systems:  As stated in the HPI and otherwise negative.   BP 120/68 (BP Location: Left Arm, Patient Position: Sitting, Cuff Size: Normal)   Pulse 60   Ht 5\' 5"  (1.651 m)   Wt 222 lb (100.7 kg)   SpO2 94%   BMI 36.94 kg/m   Physical Examination: General: Well developed, well nourished, NAD  HEENT: OP clear, mucus membranes moist  SKIN: warm, dry. No rashes. Neuro: No focal deficits  Musculoskeletal: Muscle strength 5/5 all ext  Psychiatric: Mood and affect normal  Neck: No JVD, no carotid bruits, no thyromegaly, no lymphadenopathy.  Lungs:Clear bilaterally, no wheezes, rhonci, crackles Cardiovascular: Regular rate and rhythm. No murmurs, gallops or rubs. Abdomen:Soft. Bowel sounds present. Non-tender.  Extremities: 1+bilateral lower extremity edema.    Echo 06/26/12: Left ventricle: The cavity size was normal. Systolic function was normal. The estimated ejection fraction was in the range of 55% to 60%. Wall motion was normal; there were no regional wall motion abnormalities.  EKG:  EKG is ordered today. The  ekg ordered today demonstrates  Sinus, Incomplete RBBB, LAFB  Recent Labs: No results found for requested labs within last 8760 hours.   Lipid Panel No results found for: CHOL, TRIG, HDL, CHOLHDL, VLDL, LDLCALC, LDLDIRECT   Wt Readings from Last 3 Encounters:  02/27/20 222 lb (100.7 kg)  04/24/19 223 lb (101.2 kg)  05/22/18 217 lb 12.8 oz (98.8 kg)     Other studies Reviewed: Additional studies/ records that  were reviewed today include: . Review of the above records demonstrates:   Assessment and Plan:   1. CAD s/p CABG without angina: He has no chest pain. He has refused stress testing or echocardiograms. Continue ASA, statin and beta blocker.   2. HTN: BP is controlled. Continue current therapy  3. HLD: Lipids followed in primary care. Continue statin  4. Acute on Chronic diastolic CHF: He has had a 5 lb weight loss over the past week on Lasix 40 mg BID. He still has LE edema and wheezing. Will arrange a chest x-ray and echo today. Will increase Lasix to 80 mg am and 40 mg PM. Repeat BMET in 2 weeks.   5. OSA: Followed in primary care. Non-compliant with CPAP.   Current medicines are reviewed at length with the patient today.  The patient does not have concerns regarding medicines.  The following changes have been made:  no change  Labs/ tests ordered today include:   No orders of the defined types were placed in this encounter.   Disposition:   FU with care team APP in 2 weeks.   Signed, Verne Carrow, MD 02/27/2020 10:54 AM    [facility] [address] [place], [place], [place]  [address] Phone: [phone]; Fax: [phone]

## 2020-02-27 NOTE — Patient Instructions (Signed)
Medication Instructions:  Your physician has recommended you make the following change in your medication:   INCREASE: furosemide (lasix) 40 mg tablet: Take 2 tablets (80 mg) in the morning and 1 tablet (40 mg) in the afternoon  *If you need a refill on your cardiac medications before your next appointment, please call your pharmacy*   Lab Work: We will plan to check labs (BMET) at your next appointment  If you have labs (blood work) drawn today and your tests are completely normal, you will receive your results only by: Marland Kitchen MyChart Message (if you have MyChart) OR . A paper copy in the mail If you have any lab test that is abnormal or we need to change your treatment, we will call you to review the results.   Testing/Procedures: Your physician has requested that you have an echocardiogram in [place]. Echocardiography is a painless test that uses sound waves to create images of your heart. It provides your doctor with information about the size and shape of your heart and how well your heart's chambers and valves are working. This procedure takes approximately one hour. There are no restrictions for this procedure.  A chest x-ray takes a picture of the organs and structures inside the chest, including the heart, lungs, and blood vessels. This test can show several things, including, whether the heart is enlarges; whether fluid is building up in the lungs; and whether pacemaker / defibrillator leads are still in place.  Chest X-ray Instructions:    1. You may have this done at the [facility], located in the        [facility] on the 1st floor.    2. You do no have to have an appointment.    3. [address] [place], [place] [address]        [phone]        Monday - Friday  8:00 am - 5:00 pm    Follow-Up: At [facility], you and your health needs are our priority.  As part of our continuing mission to provide you with exceptional heart  care, we have created designated Provider Care Teams.  These Care Teams include your primary Cardiologist (physician) and Advanced Practice Providers (APPs -  Physician Assistants and Nurse Practitioners) who all work together to provide you with the care you need, when you need it.  We recommend signing up for the patient portal called "MyChart".  Sign up information is provided on this After Visit Summary.  MyChart is used to connect with patients for Virtual Visits (Telemedicine).  Patients are able to view lab/test results, encounter notes, upcoming appointments, etc.  Non-urgent messages can be sent to your provider as well.   To learn more about what you can do with MyChart, go to [email or web address].    Your next appointment:   2 week(s)  The format for your next appointment:   In Person  Provider:   Ronie Spies, PA-C or Jacolyn Reedy, PA-C   Other Instructions Call to report how you are feeling next week

## 2020-03-03 ENCOUNTER — Other Ambulatory Visit: Payer: Self-pay

## 2020-03-03 ENCOUNTER — Ambulatory Visit (INDEPENDENT_AMBULATORY_CARE_PROVIDER_SITE_OTHER): Payer: PPO

## 2020-03-03 DIAGNOSIS — I5033 Acute on chronic diastolic (congestive) heart failure: Secondary | ICD-10-CM

## 2020-03-03 NOTE — Progress Notes (Addendum)
Complete echocardiogram has been performed. Contrast was needed but was unable to obtain IV access.    Jimmy Shelly Spenser RDCS, RVT

## 2020-03-08 NOTE — Progress Notes (Signed)
Cardiology Office Note    Date:  03/09/2020   ID:  KENTON FORTIN, DOB 1933/01/04, MRN [id number]  PCP:  Nicoletta Dress, MD  Cardiologist: Lauree Chandler, MD EPS: None  No chief complaint on file.   History of Present Illness:  Billy Smith is a 86 y.o. male  with history of CAD s/p 4V CABG in 2004 (LIMA to LAD, SVG to intermediate, SVG to circumflex marginal, SVG to PDA). Stress myoview August 2015 with no ischemia. Echo November 2013 with normal LV size and function. He has refused cardiac testing over the past few years, chronic diastolic CHF, COPD, OSA, HLD and venous insufficiency    Patient saw Dr. Angelena Form in 02/27/2020 because of weight gain and edema and wheezing.  He had been started on Lasix 40 mg twice daily and Dr. Angelena Form increased it to 80 mg in the morning 40 in the afternoon and ordered echo and chest x-ray and be met.  2D echo 03/03/2020 LVEF 50 to 55%, indeterminate diastolic parameters and no significant valvular abnormalities.  CXR 02/28/20 cardiomegaly no active disease.  Patient comes in accompanied by his wife and daughter. Legs still swollen with slight improvement, wheezing improved, weight unchanged. They have trouble weighing him because he is off balance and trembling. Still getting extra salt in his diet but they cut back. Still won't use CPAP   Past Medical History:  Diagnosis Date   Abnormal involuntary movements(781.0)    CAD (coronary artery disease)    with 4V CABG 2004   Chronic airway obstruction, not elsewhere classified    Nephrolithiasis    Obstructive sleep apnea (adult) (pediatric)    Venous insufficiency     Past Surgical History:  Procedure Laterality Date   CORONARY ARTERY BYPASS GRAFT  2004    Current Medications: Current Meds  Medication Sig   albuterol (PROVENTIL) (2.5 MG/3ML) 0.083% nebulizer solution Take 2.5 mg by nebulization as needed.   ascorbic acid (VITAMIN C) 500 MG tablet Take 500 mg by mouth daily.     aspirin EC 81 MG tablet Take 1 tablet (81 mg total) by mouth daily.   atorvastatin (LIPITOR) 40 MG tablet Take 40 mg by mouth daily.   bimatoprost (LUMIGAN) 0.03 % ophthalmic solution Place 1 drop into both eyes at bedtime.    brimonidine (ALPHAGAN) 0.15 % ophthalmic solution Place 1 drop into the right eye at bedtime.   Cholecalciferol (VITAMIN D) 50 MCG (2000 UT) CAPS Take 1 capsule by mouth daily.   dorzolamide-timolol (COSOPT) 22.3-6.8 MG/ML ophthalmic solution Place 1 drop into the right eye daily.   FOLTABS 800 800-10-115 MCG-MG-MCG TABS Take 1 tablet by mouth daily.   levothyroxine (SYNTHROID, LEVOTHROID) 125 MCG tablet Take 125 mcg by mouth daily.   Multiple Vitamins-Minerals (CENTRUM SILVER 50+MEN PO) Take 1 tablet by mouth daily.   primidone (MYSOLINE) 250 MG tablet Take 125 mg by mouth 3 (three) times daily. Take 1/2 tab three times a day    propranolol (INDERAL) 20 MG tablet Take 20 mg by mouth 3 (three) times daily.   [DISCONTINUED] furosemide (LASIX) 40 MG tablet Take 2 tablets (80 mg) in the morning and 1 tablet (40 mg) in the afternoon     Allergies:   Patient has no known allergies.   Social History   Socioeconomic History   Marital status: Married    Spouse name: Not on file   Number of children: 1   Years of education: Not on file   Highest  education level: Not on file  Occupational History   Occupation: Retired-welder  Tobacco Use   Smoking status: Former Smoker    Packs/day: 0.50    Years: 20.00    Pack years: 10.00    Types: Cigarettes    Quit date: 08/21/2002    Years since quitting: 17.5   Smokeless tobacco: Never Used   Tobacco comment: pt stopped smoking after his bypass  Substance and Sexual Activity   Alcohol use: No   Drug use: No   Sexual activity: Not on file  Other Topics Concern   Not on file  Social History Narrative   Not on file   Social Determinants of Health   Financial Resource Strain:    Difficulty of  Paying Living Expenses:   Food Insecurity:    Worried About Charity fundraiser in the Last Year:    Arboriculturist in the Last Year:   Transportation Needs:    Film/video editor (Medical):    Lack of Transportation (Non-Medical):   Physical Activity:    Days of Exercise per Week:    Minutes of Exercise per Session:   Stress:    Feeling of Stress :   Social Connections:    Frequency of Communication with Friends and Family:    Frequency of Social Gatherings with Friends and Family:    Attends Religious Services:    Active Member of Clubs or Organizations:    Attends Archivist Meetings:    Marital Status:      Family History:  The patient's   family history includes CAD in his brother; Hypertension in his brother and mother; Valvular heart disease in his brother.   ROS:   Please see the history of present illness.    ROS All other systems reviewed and are negative.   PHYSICAL EXAM:   VS:  BP 106/62    Pulse 63    Ht '5\' 5"'$  (1.651 m)    Wt 224 lb (101.6 kg)    SpO2 96%    BMI 37.28 kg/m   Physical Exam  GEN: Obese, in no acute distress  Neck: no JVD, carotid bruits, or masses Cardiac:RRR; no murmurs, rubs, or gallops  Respiratory: Decreased breath sounds with diffuse wheezing  GI: soft, nontender, nondistended, + BS Ext: +2 edema bilaterally Neuro:  Alert and Oriented x 3 Psych: euthymic mood, full affect  Wt Readings from Last 3 Encounters:  03/09/20 224 lb (101.6 kg)  02/27/20 222 lb (100.7 kg)  04/24/19 223 lb (101.2 kg)      Studies/Labs Reviewed:   EKG:  EKG is not ordered today.   Recent Labs: No results found for requested labs within last 8760 hours.   Lipid Panel No results found for: CHOL, TRIG, HDL, CHOLHDL, VLDL, LDLCALC, LDLDIRECT  Additional studies/ records that were reviewed today include:  2D echo 03/04/2020 IMPRESSIONS     1. Left ventricular ejection fraction, by estimation, is 50 to 55%. The  left ventricle  has low normal function. The left ventricle has no regional  wall motion abnormalities. There is severe concentric left ventricular  hypertrophy. Left ventricular  diastolic parameters are indeterminate.   2. Right ventricular systolic function is normal. The right ventricular  size is normal.   3. The mitral valve is normal in structure. No evidence of mitral valve  regurgitation. No evidence of mitral stenosis.   4. The aortic valve has an indeterminant number of cusps can not rule out  a bicuspid aortic valve. Aortic valve regurgitation is not visualized. No  aortic stenosis is present.   5. Mild aortic root calcification.   6. The inferior vena cava is normal in size with greater than 50%  respiratory variability, suggesting right atrial pressure of 3 mmHg.   FINDINGS   Left Ventricle: Left ventricular ejection fraction, by estimation, is 50  to 55%. The left ventricle has low normal function. The left ventricle has  no regional wall motion abnormalities. The left ventricular internal  cavity size was normal in size.  There is severe concentric left ventricular hypertrophy. Left ventricular  diastolic parameters are indeterminate.   Right Ventricle: The right ventricular size is normal. No increase in  right ventricular wall thickness. Right ventricular systolic function is  normal.   Left Atrium: Left atrial size was normal in size.   Right Atrium: Right atrial size was normal in size.   Pericardium: There is no evidence of pericardial effusion.   Mitral Valve: The mitral valve is normal in structure. Normal mobility of  the mitral valve leaflets. No evidence of mitral valve regurgitation. No  evidence of mitral valve stenosis.   Tricuspid Valve: The tricuspid valve is normal in structure. Tricuspid  valve regurgitation is not demonstrated. No evidence of tricuspid  stenosis.   Aortic Valve: The aortic valve has an indeterminant number of cusps. .  There is mild  thickening and mild calcification of the aortic valve.  Aortic valve regurgitation is not visualized. No aortic stenosis is  present. There is mild thickening of the  aortic valve. There is mild calcification of the aortic valve.   Pulmonic Valve: The pulmonic valve was normal in structure. Pulmonic valve  regurgitation is not visualized. No evidence of pulmonic stenosis.   Aorta: Mild aortic root calcification. The aortic root is normal in size  and structure.   Venous: The inferior vena cava is normal in size with greater than 50%  respiratory variability, suggesting right atrial pressure of 3 mmHg.   IAS/Shunts: No atrial level shunt detected by color flow Doppler.   Chest x-ray 02/27/2020 IMPRESSION: Cardiomegaly.  No active disease.     Electronically Signed   By: Rolm Baptise M.D.   On: 02/28/2020 16:03    ASSESSMENT:    1. Acute on chronic diastolic CHF (congestive heart failure) ([place])   2. Coronary artery disease involving coronary bypass graft of native heart without angina pectoris   3. Essential hypertension   4. Hyperlipidemia, unspecified hyperlipidemia type   5. SLEEP APNEA, OBSTRUCTIVE      PLAN:  In order of problems listed above:  Acute on chronic diastolic CHF recent lower extremity edema treated with extra Lasix.  2D echo normal LVEF indeterminant diastolic parameters, no valvular abnormalities.  Patient's weight and edema really have not changed.  Will check renal function and BNP today.  Stop Lasix and try Demadex 20 mg take 2 twice daily.  Follow-up in a couple weeks.  Compression stockings.  2 g sodium diet and keep legs elevated  CAD status post CABG has refused stress testing continue aspirin statin and beta-blocker  Hypertension blood pressure running low  Hyperlipidemia followed by PCP  OSA noncompliant with CPAP-now says he may consider trying it.  COPD not really using his albuterol inhaler because he is so shaky.  Consider referral to  pulmonologist but they 1 follow-up with PCP first.    Medication Adjustments/Labs and Tests Ordered: Current medicines are reviewed at length with the patient  today.  Concerns regarding medicines are outlined above.  Medication changes, Labs and Tests ordered today are listed in the Patient Instructions below. Patient Instructions   Medication Instructions:  Your physician has recommended you make the following change in your medication:   START Torsemide '20mg'$  twice daily STOP Furosemide  *If you need a refill on your cardiac medications before your next appointment, please call your pharmacy*   Lab Work: BMET, BNP Today  If you have labs (blood work) drawn today and your tests are completely normal, you will receive your results only by:  [place] (if you have MyChart) OR  A paper copy in the mail If you have any lab test that is abnormal or we need to change your treatment, we will call you to review the results.   Testing/Procedures: None   Follow-Up: At [facility], you and your health needs are our priority.  As part of our continuing mission to provide you with exceptional heart care, we have created designated Provider Care Teams.  These Care Teams include your primary Cardiologist (physician) and Advanced Practice Providers (APPs -  Physician Assistants and Nurse Practitioners) who all work together to provide you with the care you need, when you need it.  We recommend signing up for the patient portal called "MyChart".  Sign up information is provided on this After Visit Summary.  MyChart is used to connect with patients for Virtual Visits (Telemedicine).  Patients are able to view lab/test results, encounter notes, upcoming appointments, etc.  Non-urgent messages can be sent to your provider as well.   To learn more about what you can do with MyChart, go to [email or web address].    Your next appointment:   04/07/2020  The format for your next  appointment:   In Person  Provider:   Ermalinda Barrios, PA-C   Other Instructions Use Compression stockings Keep legs elevated  Two Gram Sodium Diet 2000 mg  What is Sodium? Sodium is a mineral found naturally in many foods. The most significant source of sodium in the diet is table salt, which is about 40% sodium.  Processed, convenience, and preserved foods also contain a large amount of sodium.  The body needs only 500 mg of sodium daily to function,  A normal diet provides more than enough sodium even if you do not use salt.  Why Limit Sodium? A build up of sodium in the body can cause thirst, increased blood pressure, shortness of breath, and water retention.  Decreasing sodium in the diet can reduce edema and risk of heart attack or stroke associated with high blood pressure.  Keep in mind that there are many other factors involved in these health problems.  Heredity, obesity, lack of exercise, cigarette smoking, stress and what you eat all play a role.  General Guidelines:  Do not add salt at the table or in cooking.  One teaspoon of salt contains over 2 grams of sodium.  Read food labels  Avoid processed and convenience foods  Ask your dietitian before eating any foods not dicussed in the menu planning guidelines  Consult your physician if you wish to use a salt substitute or a sodium containing medication such as antacids.  Limit milk and milk products to 16 oz (2 cups) per day.  Shopping Hints:  READ LABELS!! "Dietetic" does not necessarily mean low sodium.  Salt and other sodium ingredients are often added to foods during processing.   Menu Planning Guidelines Food Group Choose More Often Avoid  Beverages (see also the milk group All fruit juices, low-sodium, salt-free vegetables juices, low-sodium carbonated beverages Regular vegetable or tomato juices, commercially softened water used for drinking or cooking  Breads and Cereals Enriched white, wheat, rye and  pumpernickel bread, hard rolls and dinner rolls; muffins, cornbread and waffles; most dry cereals, cooked cereal without added salt; unsalted crackers and breadsticks; low sodium or homemade bread crumbs Bread, rolls and crackers with salted tops; quick breads; instant hot cereals; pancakes; commercial bread stuffing; self-rising flower and biscuit mixes; regular bread crumbs or cracker crumbs  Desserts and Sweets Desserts and sweets mad with mild should be within allowance Instant pudding mixes and cake mixes  Fats Butter or margarine; vegetable oils; unsalted salad dressings, regular salad dressings limited to 1 Tbs; light, sour and heavy cream Regular salad dressings containing bacon fat, bacon bits, and salt pork; snack dips made with instant soup mixes or processed cheese; salted nuts  Fruits Most fresh, frozen and canned fruits Fruits processed with salt or sodium-containing ingredient (some dried fruits are processed with sodium sulfites        Vegetables Fresh, frozen vegetables and low- sodium canned vegetables Regular canned vegetables, sauerkraut, pickled vegetables, and others prepared in brine; frozen vegetables in sauces; vegetables seasoned with ham, bacon or salt pork  Condiments, Sauces, Miscellaneous  Salt substitute with physician's approval; pepper, herbs, spices; vinegar, lemon or lime juice; hot pepper sauce; garlic powder, onion powder, low sodium soy sauce (1 Tbs.); low sodium condiments (ketchup, chili sauce, mustard) in limited amounts (1 tsp.) fresh ground horseradish; unsalted tortilla chips, pretzels, potato chips, popcorn, salsa (1/4 cup) Any seasoning made with salt including garlic salt, celery salt, onion salt, and seasoned salt; sea salt, rock salt, kosher salt; meat tenderizers; monosodium glutamate; mustard, regular soy sauce, barbecue, sauce, chili sauce, teriyaki sauce, steak sauce, Worcestershire sauce, and most flavored vinegars; canned gravy and mixes; regular  condiments; salted snack foods, olives, picles, relish, horseradish sauce, catsup   Food preparation: Try these seasonings Meats:    Pork Sage, onion Serve with applesauce  Chicken Poultry seasoning, thyme, parsley Serve with cranberry sauce  Lamb Curry powder, rosemary, garlic, thyme Serve with mint sauce or jelly  Veal Marjoram, basil Serve with current jelly, cranberry sauce  Beef Pepper, bay leaf Serve with dry mustard, unsalted chive butter  Fish [place] leaf, dill Serve with unsalted lemon butter, unsalted parsley butter  Vegetables:    Asparagus Lemon juice   Broccoli Lemon juice   Carrots Mustard dressing parsley, mint, nutmeg, glazed with unsalted butter and sugar   Green beans Marjoram, lemon juice, nutmeg,dill seed   Tomatoes Basil, marjoram, onion   Spice /blend for [employer]" 4 tsp ground thyme 1 tsp ground sage 3 tsp ground rosemary 4 tsp ground marjoram   Test your knowledge 1. A product that says "Salt Free" may still contain sodium. True or False 2. Garlic Powder and Hot Pepper Sauce an be used as alternative seasonings.True or False 3. Processed foods have more sodium than fresh foods.  True or False 4. Canned Vegetables have less sodium than froze True or False  WAYS TO DECREASE YOUR SODIUM INTAKE 1. Avoid the use of added salt in cooking and at the table.  Table salt (and other prepared seasonings which contain salt) is probably one of the greatest sources of sodium in the diet.  Unsalted foods can gain flavor from the sweet, sour, and butter taste sensations of herbs and spices.  Instead of using salt  for seasoning, try the following seasonings with the foods listed.  Remember: how you use them to enhance natural food flavors is limited only by your creativity... Allspice-Meat, fish, eggs, fruit, peas, red and yellow vegetables Almond Extract-Fruit baked goods Anise Seed-Sweet breads, fruit, carrots, beets, cottage cheese, cookies (tastes like licorice) Basil-Meat,  fish, eggs, vegetables, rice, vegetables salads, soups, sauces Bay Leaf-Meat, fish, stews, poultry Burnet-Salad, vegetables (cucumber-like flavor) Caraway Seed-Bread, cookies, cottage cheese, meat, vegetables, cheese, rice Cardamon-Baked goods, fruit, soups Celery Powder or seed-Salads, salad dressings, sauces, meatloaf, soup, bread.Do not use  celery salt Chervil-Meats, salads, fish, eggs, vegetables, cottage cheese (parsley-like flavor) Chili Power-Meatloaf, chicken cheese, corn, eggplant, egg dishes Chives-Salads cottage cheese, egg dishes, soups, vegetables, sauces Cilantro-Salsa, casseroles Cinnamon-Baked goods, fruit, pork, lamb, chicken, carrots Cloves-Fruit, baked goods, fish, pot roast, green beans, beets, carrots Coriander-Pastry, cookies, meat, salads, cheese (lemon-orange flavor) Cumin-Meatloaf, fish,cheese, eggs, cabbage,fruit pie (caraway flavor) [employer], fruit, eggs, fish, poultry, cottage cheese, vegetables Dill Seed-Meat, cottage cheese, poultry, vegetables, fish, salads, bread Fennel Seed-Bread, cookies, apples, pork, eggs, fish, beets, cabbage, cheese, Licorice-like flavor Garlic-(buds or powder) Salads, meat, poultry, fish, bread, butter, vegetables, potatoes.Do not  use garlic salt Ginger-Fruit, vegetables, baked goods, meat, fish, poultry Horseradish Root-Meet, vegetables, butter Lemon Juice or Extract-Vegetables, fruit, tea, baked goods, fish salads Mace-Baked goods fruit, vegetables, fish, poultry (taste like nutmeg) Maple Extract-Syrups Marjoram-Meat, chicken, fish, vegetables, breads, green salads (taste like Sage) Mint-Tea, lamb, sherbet, vegetables, desserts, carrots, cabbage Mustard, Dry or Seed-Cheese, eggs, meats, vegetables, poultry Nutmeg-Baked goods, fruit, chicken, eggs, vegetables, desserts Onion Powder-Meat, fish, poultry, vegetables, cheese, eggs, bread, rice salads (Do not use   Onion salt) Orange Extract-Desserts, baked  goods Oregano-Pasta, eggs, cheese, onions, pork, lamb, fish, chicken, vegetables, green salads Paprika-Meat, fish, poultry, eggs, cheese, vegetables Parsley Flakes-Butter, vegetables, meat fish, poultry, eggs, bread, salads (certain forms may   Contain sodium Pepper-Meat fish, poultry, vegetables, eggs Peppermint Extract-Desserts, baked goods Poppy Seed-Eggs, bread, cheese, fruit dressings, baked goods, noodles, vegetables, cottage  [employer], poultry, meat, fish, cauliflower, turnips,eggs bread Saffron-Rice, bread, veal, chicken, fish, eggs Sage-Meat, fish, poultry, onions, eggplant, tomateos, pork, stews Savory-Eggs, salads, poultry, meat, rice, vegetables, soups, pork Tarragon-Meat, poultry, fish, eggs, butter, vegetables (licorice-like flavor)  Thyme-Meat, poultry, fish, eggs, vegetables, (clover-like flavor), sauces, soups Tumeric-Salads, butter, eggs, fish, rice, vegetables (saffron-like flavor) Vanilla Extract-Baked goods, candy Vinegar-Salads, vegetables, meat marinades Walnut Extract-baked goods, candy  2. Choose your Foods Wisely   The following is a list of foods to avoid which are high in sodium:  Meats-Avoid all smoked, canned, salt cured, dried and kosher meat and fish as well as Anchovies   [employer] meats:Bologna, Liverwurst, Pastrami Canned meat or fish  Marinated herring Caviar    Pepperoni Corned Beef   Pizza Dried chipped beef  Salami Frozen breaded fish or meat Salt pork Frankfurters or hot dogs  Sardines Gefilte fish   Sausage Ham (boiled ham, Proscuitto Smoked butt    spiced ham)   Spam      TV Dinners Vegetables Canned vegetables (Regular) Relish Canned mushrooms  Sauerkraut Olives    Tomato juice Pickles  Bakery and Dessert Products Canned puddings  Cream pies Cheesecake   Decorated cakes Cookies  Beverages/Juices Tomato juice, regular  Gatorade   V-8 vegetable juice, regular  Breads and  Cereals Biscuit mixes   Salted potato chips, corn chips, pretzels Bread stuffing mixes  Salted crackers and rolls Pancake and waffle mixes Self-rising flour  Seasonings  Accent    Meat sauces Barbecue sauce  Meat tenderizer Catsup    Monosodium glutamate (MSG) Celery salt   Onion salt Chili sauce   Prepared mustard Garlic salt   Salt, seasoned salt, sea salt Gravy mixes   Soy sauce Horseradish   Steak sauce Ketchup   Tartar sauce Lite salt    Teriyaki sauce Marinade mixes   Worcestershire sauce  Others Baking powder   Cocoa and cocoa mixes Baking soda   Commercial casserole mixes Candy-caramels, chocolate  Dehydrated soups    Bars, fudge,nougats  Instant rice and pasta mixes Canned broth or soup  Maraschino cherries Cheese, aged and processed cheese and cheese spreads  Learning Assessment Quiz  Indicated T (for True) or F (for False) for each of the following statements:  1. _____ Fresh fruits and vegetables and unprocessed grains are generally low in sodium 2. _____ Water may contain a considerable amount of sodium, depending on the source 3. _____ You can always tell if a food is high in sodium by tasting it 4. _____ Certain laxatives my be high in sodium and should be avoided unless prescribed   by a physician or pharmacist 5. _____ Salt substitutes may be used freely by anyone on a sodium restricted diet 6. _____ Sodium is present in table salt, food additives and as a natural component of   most foods 7. _____ Table salt is approximately 90% sodium 8. _____ Limiting sodium intake may help prevent excess fluid accumulation in the body 9. _____ On a sodium-restricted diet, seasonings such as bouillon soy sauce, and    cooking wine should be used in place of table salt 10. _____ On an ingredient list, a product which lists monosodium glutamate as the first   ingredient is an appropriate food to include on a low sodium diet  Circle the best answer(s) to the following  statements (Hint: there may be more than one correct answer)  11. On a low-sodium diet, some acceptable snack items are:    A. Olives  F. Bean dip   K. Grapefruit juice    B. Salted Pretzels G. Commercial Popcorn   L. Canned peaches    C. Carrot Sticks  H. Bouillon   M. Unsalted nuts   D. [place] fries  I. Peanut butter crackers N. Salami   E. Sweet pickles J. Tomato Juice   O. Pizza  12.  Seasonings that may be used freely on a reduced - sodium diet include   A. Lemon wedges F.Monosodium glutamate K. Celery seed    B.Soysauce   G. Pepper   L. Mustard powder   C. Sea salt  H. Cooking wine  M. Onion flakes   D. Vinegar  E. Prepared horseradish N. Salsa   E. Sage   J. Worcestershire sauce  O. [address]      Sumner Boast, PA-C  03/09/2020 3:09 PM    [facility] [place], [place], [place]  [address] Phone: [phone]; Fax: [phone]

## 2020-03-09 ENCOUNTER — Other Ambulatory Visit: Payer: Self-pay

## 2020-03-09 ENCOUNTER — Encounter: Payer: Self-pay | Admitting: Physician Assistant

## 2020-03-09 ENCOUNTER — Ambulatory Visit: Payer: PPO | Admitting: Physician Assistant

## 2020-03-09 VITALS — BP 106/62 | HR 63 | Ht 65.0 in | Wt 224.0 lb

## 2020-03-09 DIAGNOSIS — I5033 Acute on chronic diastolic (congestive) heart failure: Secondary | ICD-10-CM | POA: Diagnosis not present

## 2020-03-09 DIAGNOSIS — I25810 Atherosclerosis of coronary artery bypass graft(s) without angina pectoris: Secondary | ICD-10-CM

## 2020-03-09 DIAGNOSIS — E785 Hyperlipidemia, unspecified: Secondary | ICD-10-CM | POA: Diagnosis not present

## 2020-03-09 DIAGNOSIS — I10 Essential (primary) hypertension: Secondary | ICD-10-CM | POA: Diagnosis not present

## 2020-03-09 DIAGNOSIS — G4733 Obstructive sleep apnea (adult) (pediatric): Secondary | ICD-10-CM | POA: Diagnosis not present

## 2020-03-09 MED ORDER — TORSEMIDE 20 MG PO TABS: 40.0000 mg | ORAL_TABLET | Freq: Two times a day (BID) | ORAL | 0 refills | Status: DC

## 2020-03-09 NOTE — Patient Instructions (Addendum)
Medication Instructions:  Your physician has recommended you make the following change in your medication:   START Torsemide 20mg  two tablets twice daily STOP Furosemide  *If you need a refill on your cardiac medications before your next appointment, please call your pharmacy*   Lab Work: BMET, BNP Today  If you have labs (blood work) drawn today and your tests are completely normal, you will receive your results only by: MyChart Message (if you have MyChart) OR . A paper copy in the mail If you have any lab test that is abnormal or we need to change your treatment, we will call you to review the results.   Testing/Procedures: None   Follow-Up: At [facility], you and your health needs are our priority.  As part of our continuing mission to provide you with exceptional heart care, we have created designated Provider Care Teams.  These Care Teams include your primary Cardiologist (physician) and Advanced Practice Providers (APPs -  Physician Assistants and Nurse Practitioners) who all work together to provide you with the care you need, when you need it.  We recommend signing up for the patient portal called "MyChart".  Sign up information is provided on this After Visit Summary.  MyChart is used to connect with patients for Virtual Visits (Telemedicine).  Patients are able to view lab/test results, encounter notes, upcoming appointments, etc.  Non-urgent messages can be sent to your provider as well.   To learn more about what you can do with MyChart, go to [facility].    Your next appointment:   04/07/2020  The format for your next appointment:   In Person  Provider:   04/09/2020, PA-C   Other Instructions Use Compression stockings Keep legs elevated  Two Gram Sodium Diet 2000 mg  What is Sodium? Sodium is a mineral found naturally in many foods. The most significant source of sodium in the diet is table salt, which is about 40% sodium.  Processed,  convenience, and preserved foods also contain a large amount of sodium.  The body needs only 500 mg of sodium daily to function,  A normal diet provides more than enough sodium even if you do not use salt.  Why Limit Sodium? A build up of sodium in the body can cause thirst, increased blood pressure, shortness of breath, and water retention.  Decreasing sodium in the diet can reduce edema and risk of heart attack or stroke associated with high blood pressure.  Keep in mind that there are many other factors involved in these health problems.  Heredity, obesity, lack of exercise, cigarette smoking, stress and what you eat all play a role.  General Guidelines:  Do not add salt at the table or in cooking.  One teaspoon of salt contains over 2 grams of sodium.  Read food labels  Avoid processed and convenience foods  Ask your dietitian before eating any foods not dicussed in the menu planning guidelines  Consult your physician if you wish to use a salt substitute or a sodium containing medication such as antacids.  Limit milk and milk products to 16 oz (2 cups) per day.  Shopping Hints:  READ LABELS!! "Dietetic" does not necessarily mean low sodium.  Salt and other sodium ingredients are often added to foods during processing.   Menu Planning Guidelines Food Group Choose More Often Avoid  Beverages (see also the milk group All fruit juices, low-sodium, salt-free vegetables juices, low-sodium carbonated beverages Regular vegetable or tomato juices, commercially softened water used  for drinking or cooking  Breads and Cereals Enriched white, wheat, rye and pumpernickel bread, hard rolls and dinner rolls; muffins, cornbread and waffles; most dry cereals, cooked cereal without added salt; unsalted crackers and breadsticks; low sodium or homemade bread crumbs Bread, rolls and crackers with salted tops; quick breads; instant hot cereals; pancakes; commercial bread stuffing; self-rising flower and  biscuit mixes; regular bread crumbs or cracker crumbs  Desserts and Sweets Desserts and sweets mad with mild should be within allowance Instant pudding mixes and cake mixes  Fats Butter or margarine; vegetable oils; unsalted salad dressings, regular salad dressings limited to 1 Tbs; light, sour and heavy cream Regular salad dressings containing bacon fat, bacon bits, and salt pork; snack dips made with instant soup mixes or processed cheese; salted nuts  Fruits Most fresh, frozen and canned fruits Fruits processed with salt or sodium-containing ingredient (some dried fruits are processed with sodium sulfites        Vegetables Fresh, frozen vegetables and low- sodium canned vegetables Regular canned vegetables, sauerkraut, pickled vegetables, and others prepared in brine; frozen vegetables in sauces; vegetables seasoned with ham, bacon or salt pork  Condiments, Sauces, Miscellaneous  Salt substitute with physician's approval; pepper, herbs, spices; vinegar, lemon or lime juice; hot pepper sauce; garlic powder, onion powder, low sodium soy sauce (1 Tbs.); low sodium condiments (ketchup, chili sauce, mustard) in limited amounts (1 tsp.) fresh ground horseradish; unsalted tortilla chips, pretzels, potato chips, popcorn, salsa (1/4 cup) Any seasoning made with salt including garlic salt, celery salt, onion salt, and seasoned salt; sea salt, rock salt, kosher salt; meat tenderizers; monosodium glutamate; mustard, regular soy sauce, barbecue, sauce, chili sauce, teriyaki sauce, steak sauce, Worcestershire sauce, and most flavored vinegars; canned gravy and mixes; regular condiments; salted snack foods, olives, picles, relish, horseradish sauce, catsup   Food preparation: Try these seasonings Meats:    Pork Sage, onion Serve with applesauce  Chicken Poultry seasoning, thyme, parsley Serve with cranberry sauce  Lamb Curry powder, rosemary, garlic, thyme Serve with mint sauce or jelly  Veal Marjoram, basil  Serve with current jelly, cranberry sauce  Beef Pepper, bay leaf Serve with dry mustard, unsalted chive butter  Fish [place] leaf, dill Serve with unsalted lemon butter, unsalted parsley butter  Vegetables:    Asparagus Lemon juice   Broccoli Lemon juice   Carrots Mustard dressing parsley, mint, nutmeg, glazed with unsalted butter and sugar   Green beans Marjoram, lemon juice, nutmeg,dill seed   Tomatoes Basil, marjoram, onion   Spice /blend for [employer]" 4 tsp ground thyme 1 tsp ground sage 3 tsp ground rosemary 4 tsp ground marjoram   Test your knowledge 1. A product that says "Salt Free" may still contain sodium. True or False 2. Garlic Powder and Hot Pepper Sauce an be used as alternative seasonings.True or False 3. Processed foods have more sodium than fresh foods.  True or False 4. Canned Vegetables have less sodium than froze True or False  WAYS TO DECREASE YOUR SODIUM INTAKE 1. Avoid the use of added salt in cooking and at the table.  Table salt (and other prepared seasonings which contain salt) is probably one of the greatest sources of sodium in the diet.  Unsalted foods can gain flavor from the sweet, sour, and butter taste sensations of herbs and spices.  Instead of using salt for seasoning, try the following seasonings with the foods listed.  Remember: how you use them to enhance natural food flavors is limited only by  your creativity... Allspice-Meat, fish, eggs, fruit, peas, red and yellow vegetables Almond Extract-Fruit baked goods Anise Seed-Sweet breads, fruit, carrots, beets, cottage cheese, cookies (tastes like licorice) Basil-Meat, fish, eggs, vegetables, rice, vegetables salads, soups, sauces Bay Leaf-Meat, fish, stews, poultry Burnet-Salad, vegetables (cucumber-like flavor) Caraway Seed-Bread, cookies, cottage cheese, meat, vegetables, cheese, rice Cardamon-Baked goods, fruit, soups Celery Powder or seed-Salads, salad dressings, sauces, meatloaf, soup, bread.Do not  use  celery salt Chervil-Meats, salads, fish, eggs, vegetables, cottage cheese (parsley-like flavor) Chili Power-Meatloaf, chicken cheese, corn, eggplant, egg dishes Chives-Salads cottage cheese, egg dishes, soups, vegetables, sauces Cilantro-Salsa, casseroles Cinnamon-Baked goods, fruit, pork, lamb, chicken, carrots Cloves-Fruit, baked goods, fish, pot roast, green beans, beets, carrots Coriander-Pastry, cookies, meat, salads, cheese (lemon-orange flavor) Cumin-Meatloaf, fish,cheese, eggs, cabbage,fruit pie (caraway flavor) [employer], fruit, eggs, fish, poultry, cottage cheese, vegetables Dill Seed-Meat, cottage cheese, poultry, vegetables, fish, salads, bread Fennel Seed-Bread, cookies, apples, pork, eggs, fish, beets, cabbage, cheese, Licorice-like flavor Garlic-(buds or powder) Salads, meat, poultry, fish, bread, butter, vegetables, potatoes.Do not  use garlic salt Ginger-Fruit, vegetables, baked goods, meat, fish, poultry Horseradish Root-Meet, vegetables, butter Lemon Juice or Extract-Vegetables, fruit, tea, baked goods, fish salads Mace-Baked goods fruit, vegetables, fish, poultry (taste like nutmeg) Maple Extract-Syrups Marjoram-Meat, chicken, fish, vegetables, breads, green salads (taste like Sage) Mint-Tea, lamb, sherbet, vegetables, desserts, carrots, cabbage Mustard, Dry or Seed-Cheese, eggs, meats, vegetables, poultry Nutmeg-Baked goods, fruit, chicken, eggs, vegetables, desserts Onion Powder-Meat, fish, poultry, vegetables, cheese, eggs, bread, rice salads (Do not use   Onion salt) Orange Extract-Desserts, baked goods Oregano-Pasta, eggs, cheese, onions, pork, lamb, fish, chicken, vegetables, green salads Paprika-Meat, fish, poultry, eggs, cheese, vegetables Parsley Flakes-Butter, vegetables, meat fish, poultry, eggs, bread, salads (certain forms may   Contain sodium Pepper-Meat fish, poultry, vegetables, eggs Peppermint Extract-Desserts, baked goods Poppy  Seed-Eggs, bread, cheese, fruit dressings, baked goods, noodles, vegetables, cottage  [employer], poultry, meat, fish, cauliflower, turnips,eggs bread Saffron-Rice, bread, veal, chicken, fish, eggs Sage-Meat, fish, poultry, onions, eggplant, tomateos, pork, stews Savory-Eggs, salads, poultry, meat, rice, vegetables, soups, pork Tarragon-Meat, poultry, fish, eggs, butter, vegetables (licorice-like flavor)  Thyme-Meat, poultry, fish, eggs, vegetables, (clover-like flavor), sauces, soups Tumeric-Salads, butter, eggs, fish, rice, vegetables (saffron-like flavor) Vanilla Extract-Baked goods, candy Vinegar-Salads, vegetables, meat marinades Walnut Extract-baked goods, candy  2. Choose your Foods Wisely   The following is a list of foods to avoid which are high in sodium:  Meats-Avoid all smoked, canned, salt cured, dried and kosher meat and fish as well as Anchovies   [employer] meats:Bologna, Liverwurst, Pastrami Canned meat or fish  Marinated herring Caviar    Pepperoni Corned Beef   Pizza Dried chipped beef  Salami Frozen breaded fish or meat Salt pork Frankfurters or hot dogs  Sardines Gefilte fish   Sausage Ham (boiled ham, Proscuitto Smoked butt    spiced ham)   Spam      TV Dinners Vegetables Canned vegetables (Regular) Relish Canned mushrooms  Sauerkraut Olives    Tomato juice Pickles  Bakery and Dessert Products Canned puddings  Cream pies Cheesecake   Decorated cakes Cookies  Beverages/Juices Tomato juice, regular  Gatorade   V-8 vegetable juice, regular  Breads and Cereals Biscuit mixes   Salted potato chips, corn chips, pretzels Bread stuffing mixes  Salted crackers and rolls Pancake and waffle mixes Self-rising flour  Seasonings Accent    Meat sauces Barbecue sauce  Meat tenderizer Catsup    Monosodium glutamate (MSG) Celery salt   Onion salt Chili sauce  Prepared mustard Garlic salt   Salt, seasoned  salt, sea salt Gravy mixes   Soy sauce Horseradish   Steak sauce Ketchup   Tartar sauce Lite salt    Teriyaki sauce Marinade mixes   Worcestershire sauce  Others Baking powder   Cocoa and cocoa mixes Baking soda   Commercial casserole mixes Candy-caramels, chocolate  Dehydrated soups    Bars, fudge,nougats  Instant rice and pasta mixes Canned broth or soup  Maraschino cherries Cheese, aged and processed cheese and cheese spreads  Learning Assessment Quiz  Indicated T (for True) or F (for False) for each of the following statements:  1. _____ Fresh fruits and vegetables and unprocessed grains are generally low in sodium 2. _____ Water may contain a considerable amount of sodium, depending on the source 3. _____ You can always tell if a food is high in sodium by tasting it 4. _____ Certain laxatives my be high in sodium and should be avoided unless prescribed   by a physician or pharmacist 5. _____ Salt substitutes may be used freely by anyone on a sodium restricted diet 6. _____ Sodium is present in table salt, food additives and as a natural component of   most foods 7. _____ Table salt is approximately 90% sodium 8. _____ Limiting sodium intake may help prevent excess fluid accumulation in the body 9. _____ On a sodium-restricted diet, seasonings such as bouillon soy sauce, and    cooking wine should be used in place of table salt 10. _____ On an ingredient list, a product which lists monosodium glutamate as the first   ingredient is an appropriate food to include on a low sodium diet  Circle the best answer(s) to the following statements (Hint: there may be more than one correct answer)  11. On a low-sodium diet, some acceptable snack items are:    A. Olives  F. Bean dip   K. Grapefruit juice    B. Salted Pretzels G. Commercial Popcorn   L. Canned peaches    C. Carrot Sticks  H. Bouillon   M. Unsalted nuts   D. [place] fries  I. Peanut butter crackers N. Salami   E. Sweet  pickles J. Tomato Juice   O. Pizza  12.  Seasonings that may be used freely on a reduced - sodium diet include   A. Lemon wedges F.Monosodium glutamate K. Celery seed    B.Soysauce   G. Pepper   L. Mustard powder   C. Sea salt  H. Cooking wine  M. Onion flakes   D. Vinegar  E. Prepared horseradish N. Salsa   E. Sage   J. Worcestershire sauce  O. Chutney

## 2020-03-10 LAB — BASIC METABOLIC PANEL
BUN/Creatinine Ratio: 27 — ABNORMAL HIGH (ref 10–24)
BUN: 23 mg/dL (ref 8–27)
CO2: 24 mmol/L (ref 20–29)
Calcium: 8.8 mg/dL (ref 8.6–10.2)
Chloride: 94 mmol/L — ABNORMAL LOW (ref 96–106)
Creatinine, Ser: 0.84 mg/dL (ref 0.76–1.27)
GFR calc Af Amer: 92 mL/min/{1.73_m2} (ref >59)
GFR calc non Af Amer: 80 mL/min/{1.73_m2} (ref >59)
Glucose: 94 mg/dL (ref 65–99)
Potassium: 4.4 mmol/L (ref 3.5–5.2)
Sodium: 133 mmol/L — ABNORMAL LOW (ref 134–144)

## 2020-03-10 LAB — PRO B NATRIURETIC PEPTIDE: NT-Pro BNP: 327 pg/mL (ref 0–486)

## 2020-03-22 DIAGNOSIS — F329 Major depressive disorder, single episode, unspecified: Secondary | ICD-10-CM | POA: Diagnosis not present

## 2020-03-22 DIAGNOSIS — Z9181 History of falling: Secondary | ICD-10-CM | POA: Diagnosis not present

## 2020-03-22 DIAGNOSIS — Z87440 Personal history of urinary (tract) infections: Secondary | ICD-10-CM | POA: Diagnosis not present

## 2020-03-22 DIAGNOSIS — J449 Chronic obstructive pulmonary disease, unspecified: Secondary | ICD-10-CM | POA: Diagnosis not present

## 2020-03-22 DIAGNOSIS — M5440 Lumbago with sciatica, unspecified side: Secondary | ICD-10-CM | POA: Diagnosis not present

## 2020-03-22 DIAGNOSIS — I5033 Acute on chronic diastolic (congestive) heart failure: Secondary | ICD-10-CM | POA: Diagnosis not present

## 2020-03-22 DIAGNOSIS — E038 Other specified hypothyroidism: Secondary | ICD-10-CM | POA: Diagnosis not present

## 2020-03-22 DIAGNOSIS — M5136 Other intervertebral disc degeneration, lumbar region: Secondary | ICD-10-CM | POA: Diagnosis not present

## 2020-03-22 DIAGNOSIS — M159 Polyosteoarthritis, unspecified: Secondary | ICD-10-CM | POA: Diagnosis not present

## 2020-03-22 DIAGNOSIS — H409 Unspecified glaucoma: Secondary | ICD-10-CM | POA: Diagnosis not present

## 2020-03-22 DIAGNOSIS — G250 Essential tremor: Secondary | ICD-10-CM | POA: Diagnosis not present

## 2020-03-22 DIAGNOSIS — G4733 Obstructive sleep apnea (adult) (pediatric): Secondary | ICD-10-CM | POA: Diagnosis not present

## 2020-03-22 DIAGNOSIS — M5030 Other cervical disc degeneration, unspecified cervical region: Secondary | ICD-10-CM | POA: Diagnosis not present

## 2020-03-22 DIAGNOSIS — I25810 Atherosclerosis of coronary artery bypass graft(s) without angina pectoris: Secondary | ICD-10-CM | POA: Diagnosis not present

## 2020-04-05 NOTE — Progress Notes (Deleted)
Cardiology Office Note    Date:  04/05/2020   ID:  HELMER DULL, DOB [birth year], MRN [id number]  PCP:  Nicoletta Dress, MD  Cardiologist: Lauree Chandler, MD EPS: None  No chief complaint on file.   History of Present Illness:  Billy Smith is a 86 y.o. male with history of CAD s/p 4V CABG in 2004 (LIMA to LAD, SVG to intermediate, SVG to circumflex marginal, SVG to PDA). Stress myoview August 2015 with no ischemia. Echo November 2013 with normal LV size and function. He has refused cardiac testing over the past few years, chronic diastolic CHF, COPD, OSA, HLD and venous insufficiency     Patient saw Dr. Angelena Form in 02/27/2020 because of weight gain and edema and wheezing.  He had been started on Lasix 40 mg twice daily and Dr. Angelena Form increased it to 80 mg in the morning 40 in the afternoon and ordered echo and chest x-ray and be met.  2D echo 03/03/2020 LVEF 50 to 55%, indeterminate diastolic parameters and no significant valvular abnormalities.  CXR 02/28/20 cardiomegaly no active disease.  I saw patient 03/09/20 and had minimal improvement.  I stopped Lasix and try Demadex 20 mg twice daily.  2 g sodium diet compression stockings and keep legs elevated.  P was 327 creatinine was normal at 0.84.  I also offered referral to to pulmonary.     Past Medical History:  Diagnosis Date  . Abnormal involuntary movements(781.0)   . CAD (coronary artery disease)    with 4V CABG 2004  . Chronic airway obstruction, not elsewhere classified   . Nephrolithiasis   . Obstructive sleep apnea (adult) (pediatric)   . Venous insufficiency     Past Surgical History:  Procedure Laterality Date  . CORONARY ARTERY BYPASS GRAFT  2004    Current Medications: No outpatient medications have been marked as taking for the 04/07/20 encounter (Appointment) with Imogene Burn, PA-C.     Allergies:   Patient has no known allergies.   Social History   Socioeconomic History  . Marital status:  Married    Spouse name: Not on file  . Number of children: 1  . Years of education: Not on file  . Highest education level: Not on file  Occupational History  . Occupation: Retired-welder  Tobacco Use  . Smoking status: Former Smoker    Packs/day: 0.50    Years: 20.00    Pack years: 10.00    Types: Cigarettes    Quit date: 08/21/2002    Years since quitting: 17.6  . Smokeless tobacco: Never Used  . Tobacco comment: pt stopped smoking after his bypass  Substance and Sexual Activity  . Alcohol use: No  . Drug use: No  . Sexual activity: Not on file  Other Topics Concern  . Not on file  Social History Narrative  . Not on file   Social Determinants of Health   Financial Resource Strain:   . Difficulty of Paying Living Expenses:   Food Insecurity:   . Worried About Charity fundraiser in the Last Year:   . Arboriculturist in the Last Year:   Transportation Needs:   . Film/video editor (Medical):   Marland Kitchen Lack of Transportation (Non-Medical):   Physical Activity:   . Days of Exercise per Week:   . Minutes of Exercise per Session:   Stress:   . Feeling of Stress :   Social Connections:   . Frequency of Communication  with Friends and Family:   . Frequency of Social Gatherings with Friends and Family:   . Attends Religious Services:   . Active Member of Clubs or Organizations:   . Attends Archivist Meetings:   Marland Kitchen Marital Status:      Family History:  The patient's ***family history includes CAD in his brother; Hypertension in his brother and mother; Valvular heart disease in his brother.   ROS:   Please see the history of present illness.    ROS All other systems reviewed and are negative.   PHYSICAL EXAM:   VS:  There were no vitals taken for this visit.  Physical Exam  GEN: Well nourished, well developed, in no acute distress  HEENT: normal  Neck: no JVD, carotid bruits, or masses Cardiac:RRR; no murmurs, rubs, or gallops  Respiratory:  clear to  auscultation bilaterally, normal work of breathing GI: soft, nontender, nondistended, + BS Ext: without cyanosis, clubbing, or edema, Good distal pulses bilaterally MS: no deformity or atrophy  Skin: warm and dry, no rash Neuro:  Alert and Oriented x 3, Strength and sensation are intact Psych: euthymic mood, full affect  Wt Readings from Last 3 Encounters:  03/09/20 224 lb (101.6 kg)  02/27/20 222 lb (100.7 kg)  04/24/19 223 lb (101.2 kg)      Studies/Labs Reviewed:   EKG:  EKG is*** ordered today.  The ekg ordered today demonstrates ***  Recent Labs: 03/09/2020: BUN 23; Creatinine, Ser 0.84; NT-Pro BNP 327; Potassium 4.4; Sodium 133   Lipid Panel No results found for: CHOL, TRIG, HDL, CHOLHDL, VLDL, LDLCALC, LDLDIRECT  Additional studies/ records that were reviewed today include:   2D echo 03/04/2020 IMPRESSIONS     1. Left ventricular ejection fraction, by estimation, is 50 to 55%. The  left ventricle has low normal function. The left ventricle has no regional  wall motion abnormalities. There is severe concentric left ventricular  hypertrophy. Left ventricular  diastolic parameters are indeterminate.   2. Right ventricular systolic function is normal. The right ventricular  size is normal.   3. The mitral valve is normal in structure. No evidence of mitral valve  regurgitation. No evidence of mitral stenosis.   4. The aortic valve has an indeterminant number of cusps can not rule out  a bicuspid aortic valve. Aortic valve regurgitation is not visualized. No  aortic stenosis is present.   5. Mild aortic root calcification.   6. The inferior vena cava is normal in size with greater than 50%  respiratory variability, suggesting right atrial pressure of 3 mmHg.   FINDINGS   Left Ventricle: Left ventricular ejection fraction, by estimation, is 50  to 55%. The left ventricle has low normal function. The left ventricle has  no regional wall motion abnormalities. The left  ventricular internal  cavity size was normal in size.  There is severe concentric left ventricular hypertrophy. Left ventricular  diastolic parameters are indeterminate.   Right Ventricle: The right ventricular size is normal. No increase in  right ventricular wall thickness. Right ventricular systolic function is  normal.   Left Atrium: Left atrial size was normal in size.   Right Atrium: Right atrial size was normal in size.   Pericardium: There is no evidence of pericardial effusion.   Mitral Valve: The mitral valve is normal in structure. Normal mobility of  the mitral valve leaflets. No evidence of mitral valve regurgitation. No  evidence of mitral valve stenosis.   Tricuspid Valve: The tricuspid valve  is normal in structure. Tricuspid  valve regurgitation is not demonstrated. No evidence of tricuspid  stenosis.   Aortic Valve: The aortic valve has an indeterminant number of cusps. .  There is mild thickening and mild calcification of the aortic valve.  Aortic valve regurgitation is not visualized. No aortic stenosis is  present. There is mild thickening of the  aortic valve. There is mild calcification of the aortic valve.   Pulmonic Valve: The pulmonic valve was normal in structure. Pulmonic valve  regurgitation is not visualized. No evidence of pulmonic stenosis.   Aorta: Mild aortic root calcification. The aortic root is normal in size  and structure.   Venous: The inferior vena cava is normal in size with greater than 50%  respiratory variability, suggesting right atrial pressure of 3 mmHg.   IAS/Shunts: No atrial level shunt detected by color flow Doppler.    Chest x-ray 02/27/2020 IMPRESSION: Cardiomegaly.  No active disease.     Electronically Signed   By: Rolm Baptise M.D.   On: 02/28/2020 16:03        ASSESSMENT:    1. Chronic diastolic CHF (congestive heart failure) ([place])   2. Coronary artery disease involving coronary bypass graft of native heart  without angina pectoris   3. Essential hypertension   4. Hyperlipidemia, unspecified hyperlipidemia type   5. SLEEP APNEA, OBSTRUCTIVE      PLAN:  In order of problems listed above:  Acute on chronic diastolic CHF recent lower extremity edema treated with extra Lasix.  2D echo normal LVEF indeterminant diastolic parameters, no valvular abnormalities.    renal function and BNP normal 03/09/20. I Stopped Lasix and try Demadex 20 mg take 2 twice daily.  .  Compression stockings.  2 g sodium diet and keep legs elevated   CAD status post CABG has refused stress testing continue aspirin statin and beta-blocker   Hypertension blood pressure running low   Hyperlipidemia followed by PCP   OSA noncompliant with CPAP-now says he may consider trying it.   COPD not really using his albuterol inhaler because he is so shaky. Offered referral to pulmonologist but they want follow-up with PCP first.         Medication Adjustments/Labs and Tests Ordered: Current medicines are reviewed at length with the patient today.  Concerns regarding medicines are outlined above.  Medication changes, Labs and Tests ordered today are listed in the Patient Instructions below. There are no Patient Instructions on file for this visit.   Sumner Boast, PA-C  04/05/2020 3:32 PM    [facility] [address] [place], [place], [place]  [address] Phone: [phone]; Fax: [phone]

## 2020-04-07 ENCOUNTER — Ambulatory Visit: Payer: PPO | Admitting: Physician Assistant

## 2020-04-07 DIAGNOSIS — G4733 Obstructive sleep apnea (adult) (pediatric): Secondary | ICD-10-CM

## 2020-04-07 DIAGNOSIS — I10 Essential (primary) hypertension: Secondary | ICD-10-CM

## 2020-04-07 DIAGNOSIS — I25810 Atherosclerosis of coronary artery bypass graft(s) without angina pectoris: Secondary | ICD-10-CM

## 2020-04-07 DIAGNOSIS — E785 Hyperlipidemia, unspecified: Secondary | ICD-10-CM

## 2020-04-07 DIAGNOSIS — I5032 Chronic diastolic (congestive) heart failure: Secondary | ICD-10-CM

## 2020-04-21 DIAGNOSIS — Z9181 History of falling: Secondary | ICD-10-CM | POA: Diagnosis not present

## 2020-04-21 DIAGNOSIS — F329 Major depressive disorder, single episode, unspecified: Secondary | ICD-10-CM | POA: Diagnosis not present

## 2020-04-21 DIAGNOSIS — J449 Chronic obstructive pulmonary disease, unspecified: Secondary | ICD-10-CM | POA: Diagnosis not present

## 2020-04-21 DIAGNOSIS — M159 Polyosteoarthritis, unspecified: Secondary | ICD-10-CM | POA: Diagnosis not present

## 2020-04-21 DIAGNOSIS — M5440 Lumbago with sciatica, unspecified side: Secondary | ICD-10-CM | POA: Diagnosis not present

## 2020-04-21 DIAGNOSIS — M5136 Other intervertebral disc degeneration, lumbar region: Secondary | ICD-10-CM | POA: Diagnosis not present

## 2020-04-21 DIAGNOSIS — Z87440 Personal history of urinary (tract) infections: Secondary | ICD-10-CM | POA: Diagnosis not present

## 2020-04-21 DIAGNOSIS — G250 Essential tremor: Secondary | ICD-10-CM | POA: Diagnosis not present

## 2020-04-21 DIAGNOSIS — H409 Unspecified glaucoma: Secondary | ICD-10-CM | POA: Diagnosis not present

## 2020-04-21 DIAGNOSIS — I5033 Acute on chronic diastolic (congestive) heart failure: Secondary | ICD-10-CM | POA: Diagnosis not present

## 2020-04-21 DIAGNOSIS — E038 Other specified hypothyroidism: Secondary | ICD-10-CM | POA: Diagnosis not present

## 2020-04-21 DIAGNOSIS — I25810 Atherosclerosis of coronary artery bypass graft(s) without angina pectoris: Secondary | ICD-10-CM | POA: Diagnosis not present

## 2020-04-21 DIAGNOSIS — M5030 Other cervical disc degeneration, unspecified cervical region: Secondary | ICD-10-CM | POA: Diagnosis not present

## 2020-04-21 DIAGNOSIS — G4733 Obstructive sleep apnea (adult) (pediatric): Secondary | ICD-10-CM | POA: Diagnosis not present

## 2020-05-04 ENCOUNTER — Other Ambulatory Visit: Payer: Self-pay | Admitting: Physician Assistant

## 2020-05-21 DIAGNOSIS — Z87440 Personal history of urinary (tract) infections: Secondary | ICD-10-CM | POA: Diagnosis not present

## 2020-05-21 DIAGNOSIS — Z9181 History of falling: Secondary | ICD-10-CM | POA: Diagnosis not present

## 2020-05-21 DIAGNOSIS — F329 Major depressive disorder, single episode, unspecified: Secondary | ICD-10-CM | POA: Diagnosis not present

## 2020-05-21 DIAGNOSIS — E038 Other specified hypothyroidism: Secondary | ICD-10-CM | POA: Diagnosis not present

## 2020-05-21 DIAGNOSIS — M5440 Lumbago with sciatica, unspecified side: Secondary | ICD-10-CM | POA: Diagnosis not present

## 2020-05-21 DIAGNOSIS — M159 Polyosteoarthritis, unspecified: Secondary | ICD-10-CM | POA: Diagnosis not present

## 2020-05-21 DIAGNOSIS — H409 Unspecified glaucoma: Secondary | ICD-10-CM | POA: Diagnosis not present

## 2020-05-21 DIAGNOSIS — M5136 Other intervertebral disc degeneration, lumbar region: Secondary | ICD-10-CM | POA: Diagnosis not present

## 2020-05-21 DIAGNOSIS — G250 Essential tremor: Secondary | ICD-10-CM | POA: Diagnosis not present

## 2020-05-21 DIAGNOSIS — G4733 Obstructive sleep apnea (adult) (pediatric): Secondary | ICD-10-CM | POA: Diagnosis not present

## 2020-05-21 DIAGNOSIS — M5030 Other cervical disc degeneration, unspecified cervical region: Secondary | ICD-10-CM | POA: Diagnosis not present

## 2020-05-21 DIAGNOSIS — I5033 Acute on chronic diastolic (congestive) heart failure: Secondary | ICD-10-CM | POA: Diagnosis not present

## 2020-05-21 DIAGNOSIS — I25810 Atherosclerosis of coronary artery bypass graft(s) without angina pectoris: Secondary | ICD-10-CM | POA: Diagnosis not present

## 2020-05-21 DIAGNOSIS — J449 Chronic obstructive pulmonary disease, unspecified: Secondary | ICD-10-CM | POA: Diagnosis not present

## 2020-07-23 DIAGNOSIS — R262 Difficulty in walking, not elsewhere classified: Secondary | ICD-10-CM | POA: Diagnosis not present

## 2020-07-23 DIAGNOSIS — G4733 Obstructive sleep apnea (adult) (pediatric): Secondary | ICD-10-CM | POA: Diagnosis not present

## 2020-07-23 DIAGNOSIS — I25810 Atherosclerosis of coronary artery bypass graft(s) without angina pectoris: Secondary | ICD-10-CM | POA: Diagnosis not present

## 2020-07-23 DIAGNOSIS — Z6839 Body mass index (BMI) 39.0-39.9, adult: Secondary | ICD-10-CM | POA: Diagnosis not present

## 2020-07-23 DIAGNOSIS — Z139 Encounter for screening, unspecified: Secondary | ICD-10-CM | POA: Diagnosis not present

## 2020-07-23 DIAGNOSIS — B9689 Other specified bacterial agents as the cause of diseases classified elsewhere: Secondary | ICD-10-CM | POA: Diagnosis not present

## 2020-07-23 DIAGNOSIS — E038 Other specified hypothyroidism: Secondary | ICD-10-CM | POA: Diagnosis not present

## 2020-07-23 DIAGNOSIS — I5032 Chronic diastolic (congestive) heart failure: Secondary | ICD-10-CM | POA: Diagnosis not present

## 2020-07-23 DIAGNOSIS — J208 Acute bronchitis due to other specified organisms: Secondary | ICD-10-CM | POA: Diagnosis not present

## 2020-07-23 DIAGNOSIS — Z79899 Other long term (current) drug therapy: Secondary | ICD-10-CM | POA: Diagnosis not present

## 2020-07-23 DIAGNOSIS — E785 Hyperlipidemia, unspecified: Secondary | ICD-10-CM | POA: Diagnosis not present

## 2020-07-29 DIAGNOSIS — M159 Polyosteoarthritis, unspecified: Secondary | ICD-10-CM | POA: Diagnosis not present

## 2020-07-29 DIAGNOSIS — J208 Acute bronchitis due to other specified organisms: Secondary | ICD-10-CM | POA: Diagnosis not present

## 2020-07-29 DIAGNOSIS — E785 Hyperlipidemia, unspecified: Secondary | ICD-10-CM | POA: Diagnosis not present

## 2020-07-29 DIAGNOSIS — I2510 Atherosclerotic heart disease of native coronary artery without angina pectoris: Secondary | ICD-10-CM | POA: Diagnosis not present

## 2020-07-29 DIAGNOSIS — Z9181 History of falling: Secondary | ICD-10-CM | POA: Diagnosis not present

## 2020-07-29 DIAGNOSIS — M5136 Other intervertebral disc degeneration, lumbar region: Secondary | ICD-10-CM | POA: Diagnosis not present

## 2020-07-29 DIAGNOSIS — J309 Allergic rhinitis, unspecified: Secondary | ICD-10-CM | POA: Diagnosis not present

## 2020-07-29 DIAGNOSIS — G250 Essential tremor: Secondary | ICD-10-CM | POA: Diagnosis not present

## 2020-07-29 DIAGNOSIS — I25810 Atherosclerosis of coronary artery bypass graft(s) without angina pectoris: Secondary | ICD-10-CM | POA: Diagnosis not present

## 2020-07-29 DIAGNOSIS — J449 Chronic obstructive pulmonary disease, unspecified: Secondary | ICD-10-CM | POA: Diagnosis not present

## 2020-07-29 DIAGNOSIS — B9689 Other specified bacterial agents as the cause of diseases classified elsewhere: Secondary | ICD-10-CM | POA: Diagnosis not present

## 2020-07-29 DIAGNOSIS — Z7951 Long term (current) use of inhaled steroids: Secondary | ICD-10-CM | POA: Diagnosis not present

## 2020-07-29 DIAGNOSIS — G4733 Obstructive sleep apnea (adult) (pediatric): Secondary | ICD-10-CM | POA: Diagnosis not present

## 2020-07-29 DIAGNOSIS — H543 Unqualified visual loss, both eyes: Secondary | ICD-10-CM | POA: Diagnosis not present

## 2020-07-29 DIAGNOSIS — I5032 Chronic diastolic (congestive) heart failure: Secondary | ICD-10-CM | POA: Diagnosis not present

## 2020-07-29 DIAGNOSIS — F32A Depression, unspecified: Secondary | ICD-10-CM | POA: Diagnosis not present

## 2020-07-29 DIAGNOSIS — H409 Unspecified glaucoma: Secondary | ICD-10-CM | POA: Diagnosis not present

## 2020-07-29 DIAGNOSIS — Z6839 Body mass index (BMI) 39.0-39.9, adult: Secondary | ICD-10-CM | POA: Diagnosis not present

## 2020-07-29 DIAGNOSIS — E875 Hyperkalemia: Secondary | ICD-10-CM | POA: Diagnosis not present

## 2020-07-29 DIAGNOSIS — H4050X0 Glaucoma secondary to other eye disorders, unspecified eye, stage unspecified: Secondary | ICD-10-CM | POA: Diagnosis not present

## 2020-07-29 DIAGNOSIS — Z7982 Long term (current) use of aspirin: Secondary | ICD-10-CM | POA: Diagnosis not present

## 2020-08-17 DIAGNOSIS — B9689 Other specified bacterial agents as the cause of diseases classified elsewhere: Secondary | ICD-10-CM | POA: Diagnosis not present

## 2020-08-17 DIAGNOSIS — J208 Acute bronchitis due to other specified organisms: Secondary | ICD-10-CM | POA: Diagnosis not present

## 2020-08-28 DIAGNOSIS — M5136 Other intervertebral disc degeneration, lumbar region: Secondary | ICD-10-CM | POA: Diagnosis not present

## 2020-08-28 DIAGNOSIS — Z7982 Long term (current) use of aspirin: Secondary | ICD-10-CM | POA: Diagnosis not present

## 2020-08-28 DIAGNOSIS — H543 Unqualified visual loss, both eyes: Secondary | ICD-10-CM | POA: Diagnosis not present

## 2020-08-28 DIAGNOSIS — I5032 Chronic diastolic (congestive) heart failure: Secondary | ICD-10-CM | POA: Diagnosis not present

## 2020-08-28 DIAGNOSIS — J309 Allergic rhinitis, unspecified: Secondary | ICD-10-CM | POA: Diagnosis not present

## 2020-08-28 DIAGNOSIS — G4733 Obstructive sleep apnea (adult) (pediatric): Secondary | ICD-10-CM | POA: Diagnosis not present

## 2020-08-28 DIAGNOSIS — E875 Hyperkalemia: Secondary | ICD-10-CM | POA: Diagnosis not present

## 2020-08-28 DIAGNOSIS — H409 Unspecified glaucoma: Secondary | ICD-10-CM | POA: Diagnosis not present

## 2020-08-28 DIAGNOSIS — M159 Polyosteoarthritis, unspecified: Secondary | ICD-10-CM | POA: Diagnosis not present

## 2020-08-28 DIAGNOSIS — Z7951 Long term (current) use of inhaled steroids: Secondary | ICD-10-CM | POA: Diagnosis not present

## 2020-08-28 DIAGNOSIS — I2510 Atherosclerotic heart disease of native coronary artery without angina pectoris: Secondary | ICD-10-CM | POA: Diagnosis not present

## 2020-08-28 DIAGNOSIS — E785 Hyperlipidemia, unspecified: Secondary | ICD-10-CM | POA: Diagnosis not present

## 2020-08-28 DIAGNOSIS — H4050X0 Glaucoma secondary to other eye disorders, unspecified eye, stage unspecified: Secondary | ICD-10-CM | POA: Diagnosis not present

## 2020-08-28 DIAGNOSIS — G250 Essential tremor: Secondary | ICD-10-CM | POA: Diagnosis not present

## 2020-08-28 DIAGNOSIS — F32A Depression, unspecified: Secondary | ICD-10-CM | POA: Diagnosis not present

## 2020-08-28 DIAGNOSIS — I25810 Atherosclerosis of coronary artery bypass graft(s) without angina pectoris: Secondary | ICD-10-CM | POA: Diagnosis not present

## 2020-08-28 DIAGNOSIS — B9689 Other specified bacterial agents as the cause of diseases classified elsewhere: Secondary | ICD-10-CM | POA: Diagnosis not present

## 2020-08-28 DIAGNOSIS — J449 Chronic obstructive pulmonary disease, unspecified: Secondary | ICD-10-CM | POA: Diagnosis not present

## 2020-08-28 DIAGNOSIS — Z9181 History of falling: Secondary | ICD-10-CM | POA: Diagnosis not present

## 2020-08-28 DIAGNOSIS — Z6839 Body mass index (BMI) 39.0-39.9, adult: Secondary | ICD-10-CM | POA: Diagnosis not present

## 2020-08-28 DIAGNOSIS — J208 Acute bronchitis due to other specified organisms: Secondary | ICD-10-CM | POA: Diagnosis not present

## 2020-09-16 DIAGNOSIS — M25562 Pain in left knee: Secondary | ICD-10-CM | POA: Diagnosis not present

## 2020-09-16 DIAGNOSIS — G250 Essential tremor: Secondary | ICD-10-CM | POA: Diagnosis not present

## 2020-09-16 DIAGNOSIS — J441 Chronic obstructive pulmonary disease with (acute) exacerbation: Secondary | ICD-10-CM | POA: Diagnosis not present

## 2020-09-23 DIAGNOSIS — N390 Urinary tract infection, site not specified: Secondary | ICD-10-CM | POA: Diagnosis not present

## 2020-09-23 DIAGNOSIS — R822 Biliuria: Secondary | ICD-10-CM | POA: Diagnosis not present

## 2020-09-27 DIAGNOSIS — J449 Chronic obstructive pulmonary disease, unspecified: Secondary | ICD-10-CM | POA: Diagnosis not present

## 2020-09-27 DIAGNOSIS — I5032 Chronic diastolic (congestive) heart failure: Secondary | ICD-10-CM | POA: Diagnosis not present

## 2020-09-27 DIAGNOSIS — I2510 Atherosclerotic heart disease of native coronary artery without angina pectoris: Secondary | ICD-10-CM | POA: Diagnosis not present

## 2020-09-27 DIAGNOSIS — I25810 Atherosclerosis of coronary artery bypass graft(s) without angina pectoris: Secondary | ICD-10-CM | POA: Diagnosis not present

## 2020-09-27 DIAGNOSIS — E875 Hyperkalemia: Secondary | ICD-10-CM | POA: Diagnosis not present

## 2020-09-27 DIAGNOSIS — G4733 Obstructive sleep apnea (adult) (pediatric): Secondary | ICD-10-CM | POA: Diagnosis not present

## 2020-09-27 DIAGNOSIS — Z7951 Long term (current) use of inhaled steroids: Secondary | ICD-10-CM | POA: Diagnosis not present

## 2020-09-27 DIAGNOSIS — M5136 Other intervertebral disc degeneration, lumbar region: Secondary | ICD-10-CM | POA: Diagnosis not present

## 2020-09-27 DIAGNOSIS — J309 Allergic rhinitis, unspecified: Secondary | ICD-10-CM | POA: Diagnosis not present

## 2020-09-27 DIAGNOSIS — H4050X0 Glaucoma secondary to other eye disorders, unspecified eye, stage unspecified: Secondary | ICD-10-CM | POA: Diagnosis not present

## 2020-09-27 DIAGNOSIS — H409 Unspecified glaucoma: Secondary | ICD-10-CM | POA: Diagnosis not present

## 2020-09-27 DIAGNOSIS — B9689 Other specified bacterial agents as the cause of diseases classified elsewhere: Secondary | ICD-10-CM | POA: Diagnosis not present

## 2020-09-27 DIAGNOSIS — Z9181 History of falling: Secondary | ICD-10-CM | POA: Diagnosis not present

## 2020-09-27 DIAGNOSIS — Z7982 Long term (current) use of aspirin: Secondary | ICD-10-CM | POA: Diagnosis not present

## 2020-09-27 DIAGNOSIS — J208 Acute bronchitis due to other specified organisms: Secondary | ICD-10-CM | POA: Diagnosis not present

## 2020-09-27 DIAGNOSIS — M159 Polyosteoarthritis, unspecified: Secondary | ICD-10-CM | POA: Diagnosis not present

## 2020-09-27 DIAGNOSIS — H543 Unqualified visual loss, both eyes: Secondary | ICD-10-CM | POA: Diagnosis not present

## 2020-09-27 DIAGNOSIS — Z6839 Body mass index (BMI) 39.0-39.9, adult: Secondary | ICD-10-CM | POA: Diagnosis not present

## 2020-09-27 DIAGNOSIS — F32A Depression, unspecified: Secondary | ICD-10-CM | POA: Diagnosis not present

## 2020-09-27 DIAGNOSIS — G250 Essential tremor: Secondary | ICD-10-CM | POA: Diagnosis not present

## 2020-09-27 DIAGNOSIS — E785 Hyperlipidemia, unspecified: Secondary | ICD-10-CM | POA: Diagnosis not present

## 2021-03-22 DIAGNOSIS — I5032 Chronic diastolic (congestive) heart failure: Secondary | ICD-10-CM | POA: Diagnosis not present

## 2021-03-22 DIAGNOSIS — R262 Difficulty in walking, not elsewhere classified: Secondary | ICD-10-CM | POA: Diagnosis not present

## 2021-03-22 DIAGNOSIS — N390 Urinary tract infection, site not specified: Secondary | ICD-10-CM | POA: Diagnosis not present

## 2021-03-22 DIAGNOSIS — S20212A Contusion of left front wall of thorax, initial encounter: Secondary | ICD-10-CM | POA: Diagnosis not present

## 2021-03-24 DIAGNOSIS — G4733 Obstructive sleep apnea (adult) (pediatric): Secondary | ICD-10-CM | POA: Diagnosis not present

## 2021-03-24 DIAGNOSIS — E875 Hyperkalemia: Secondary | ICD-10-CM | POA: Diagnosis not present

## 2021-03-24 DIAGNOSIS — S20212D Contusion of left front wall of thorax, subsequent encounter: Secondary | ICD-10-CM | POA: Diagnosis not present

## 2021-03-24 DIAGNOSIS — N390 Urinary tract infection, site not specified: Secondary | ICD-10-CM | POA: Diagnosis not present

## 2021-03-24 DIAGNOSIS — J309 Allergic rhinitis, unspecified: Secondary | ICD-10-CM | POA: Diagnosis not present

## 2021-03-24 DIAGNOSIS — M5136 Other intervertebral disc degeneration, lumbar region: Secondary | ICD-10-CM | POA: Diagnosis not present

## 2021-03-24 DIAGNOSIS — M159 Polyosteoarthritis, unspecified: Secondary | ICD-10-CM | POA: Diagnosis not present

## 2021-03-24 DIAGNOSIS — I25810 Atherosclerosis of coronary artery bypass graft(s) without angina pectoris: Secondary | ICD-10-CM | POA: Diagnosis not present

## 2021-03-24 DIAGNOSIS — Z9181 History of falling: Secondary | ICD-10-CM | POA: Diagnosis not present

## 2021-03-24 DIAGNOSIS — H409 Unspecified glaucoma: Secondary | ICD-10-CM | POA: Diagnosis not present

## 2021-03-24 DIAGNOSIS — F32A Depression, unspecified: Secondary | ICD-10-CM | POA: Diagnosis not present

## 2021-03-24 DIAGNOSIS — E785 Hyperlipidemia, unspecified: Secondary | ICD-10-CM | POA: Diagnosis not present

## 2021-03-24 DIAGNOSIS — J449 Chronic obstructive pulmonary disease, unspecified: Secondary | ICD-10-CM | POA: Diagnosis not present

## 2021-03-24 DIAGNOSIS — M5030 Other cervical disc degeneration, unspecified cervical region: Secondary | ICD-10-CM | POA: Diagnosis not present

## 2021-03-24 DIAGNOSIS — E038 Other specified hypothyroidism: Secondary | ICD-10-CM | POA: Diagnosis not present

## 2021-03-24 DIAGNOSIS — G250 Essential tremor: Secondary | ICD-10-CM | POA: Diagnosis not present

## 2021-03-24 DIAGNOSIS — H5440 Blindness, one eye, unspecified eye: Secondary | ICD-10-CM | POA: Diagnosis not present

## 2021-03-24 DIAGNOSIS — I5032 Chronic diastolic (congestive) heart failure: Secondary | ICD-10-CM | POA: Diagnosis not present

## 2021-04-23 DIAGNOSIS — M5030 Other cervical disc degeneration, unspecified cervical region: Secondary | ICD-10-CM | POA: Diagnosis not present

## 2021-04-23 DIAGNOSIS — J309 Allergic rhinitis, unspecified: Secondary | ICD-10-CM | POA: Diagnosis not present

## 2021-04-23 DIAGNOSIS — E038 Other specified hypothyroidism: Secondary | ICD-10-CM | POA: Diagnosis not present

## 2021-04-23 DIAGNOSIS — M159 Polyosteoarthritis, unspecified: Secondary | ICD-10-CM | POA: Diagnosis not present

## 2021-04-23 DIAGNOSIS — N390 Urinary tract infection, site not specified: Secondary | ICD-10-CM | POA: Diagnosis not present

## 2021-04-23 DIAGNOSIS — G4733 Obstructive sleep apnea (adult) (pediatric): Secondary | ICD-10-CM | POA: Diagnosis not present

## 2021-04-23 DIAGNOSIS — E785 Hyperlipidemia, unspecified: Secondary | ICD-10-CM | POA: Diagnosis not present

## 2021-04-23 DIAGNOSIS — S20212D Contusion of left front wall of thorax, subsequent encounter: Secondary | ICD-10-CM | POA: Diagnosis not present

## 2021-04-23 DIAGNOSIS — H5440 Blindness, one eye, unspecified eye: Secondary | ICD-10-CM | POA: Diagnosis not present

## 2021-04-23 DIAGNOSIS — G250 Essential tremor: Secondary | ICD-10-CM | POA: Diagnosis not present

## 2021-04-23 DIAGNOSIS — E875 Hyperkalemia: Secondary | ICD-10-CM | POA: Diagnosis not present

## 2021-04-23 DIAGNOSIS — F32A Depression, unspecified: Secondary | ICD-10-CM | POA: Diagnosis not present

## 2021-04-23 DIAGNOSIS — H409 Unspecified glaucoma: Secondary | ICD-10-CM | POA: Diagnosis not present

## 2021-04-23 DIAGNOSIS — I5032 Chronic diastolic (congestive) heart failure: Secondary | ICD-10-CM | POA: Diagnosis not present

## 2021-04-23 DIAGNOSIS — J449 Chronic obstructive pulmonary disease, unspecified: Secondary | ICD-10-CM | POA: Diagnosis not present

## 2021-04-23 DIAGNOSIS — M5136 Other intervertebral disc degeneration, lumbar region: Secondary | ICD-10-CM | POA: Diagnosis not present

## 2021-04-23 DIAGNOSIS — I25810 Atherosclerosis of coronary artery bypass graft(s) without angina pectoris: Secondary | ICD-10-CM | POA: Diagnosis not present

## 2021-04-23 DIAGNOSIS — Z9181 History of falling: Secondary | ICD-10-CM | POA: Diagnosis not present

## 2021-05-17 DIAGNOSIS — H52203 Unspecified astigmatism, bilateral: Secondary | ICD-10-CM | POA: Diagnosis not present

## 2021-05-17 DIAGNOSIS — H5203 Hypermetropia, bilateral: Secondary | ICD-10-CM | POA: Diagnosis not present

## 2021-05-17 DIAGNOSIS — H2513 Age-related nuclear cataract, bilateral: Secondary | ICD-10-CM | POA: Diagnosis not present

## 2021-05-17 DIAGNOSIS — H401113 Primary open-angle glaucoma, right eye, severe stage: Secondary | ICD-10-CM | POA: Diagnosis not present

## 2021-05-17 DIAGNOSIS — H25011 Cortical age-related cataract, right eye: Secondary | ICD-10-CM | POA: Diagnosis not present

## 2021-05-17 DIAGNOSIS — H401122 Primary open-angle glaucoma, left eye, moderate stage: Secondary | ICD-10-CM | POA: Diagnosis not present

## 2021-05-17 DIAGNOSIS — H524 Presbyopia: Secondary | ICD-10-CM | POA: Diagnosis not present

## 2021-05-26 DIAGNOSIS — R2689 Other abnormalities of gait and mobility: Secondary | ICD-10-CM | POA: Diagnosis not present

## 2021-05-26 DIAGNOSIS — M6281 Muscle weakness (generalized): Secondary | ICD-10-CM | POA: Diagnosis not present

## 2021-05-30 DIAGNOSIS — M6281 Muscle weakness (generalized): Secondary | ICD-10-CM | POA: Diagnosis not present

## 2021-05-30 DIAGNOSIS — R2689 Other abnormalities of gait and mobility: Secondary | ICD-10-CM | POA: Diagnosis not present

## 2021-06-01 DIAGNOSIS — R2689 Other abnormalities of gait and mobility: Secondary | ICD-10-CM | POA: Diagnosis not present

## 2021-06-01 DIAGNOSIS — M6281 Muscle weakness (generalized): Secondary | ICD-10-CM | POA: Diagnosis not present

## 2021-06-06 DIAGNOSIS — M6281 Muscle weakness (generalized): Secondary | ICD-10-CM | POA: Diagnosis not present

## 2021-06-06 DIAGNOSIS — R2689 Other abnormalities of gait and mobility: Secondary | ICD-10-CM | POA: Diagnosis not present

## 2021-06-09 DIAGNOSIS — R2689 Other abnormalities of gait and mobility: Secondary | ICD-10-CM | POA: Diagnosis not present

## 2021-06-09 DIAGNOSIS — M6281 Muscle weakness (generalized): Secondary | ICD-10-CM | POA: Diagnosis not present

## 2021-06-13 DIAGNOSIS — M6281 Muscle weakness (generalized): Secondary | ICD-10-CM | POA: Diagnosis not present

## 2021-06-13 DIAGNOSIS — R2689 Other abnormalities of gait and mobility: Secondary | ICD-10-CM | POA: Diagnosis not present

## 2021-06-15 DIAGNOSIS — R2689 Other abnormalities of gait and mobility: Secondary | ICD-10-CM | POA: Diagnosis not present

## 2021-06-15 DIAGNOSIS — M6281 Muscle weakness (generalized): Secondary | ICD-10-CM | POA: Diagnosis not present

## 2021-06-20 DIAGNOSIS — M6281 Muscle weakness (generalized): Secondary | ICD-10-CM | POA: Diagnosis not present

## 2021-06-20 DIAGNOSIS — R2689 Other abnormalities of gait and mobility: Secondary | ICD-10-CM | POA: Diagnosis not present

## 2021-06-22 DIAGNOSIS — M6281 Muscle weakness (generalized): Secondary | ICD-10-CM | POA: Diagnosis not present

## 2021-06-22 DIAGNOSIS — R2689 Other abnormalities of gait and mobility: Secondary | ICD-10-CM | POA: Diagnosis not present

## 2021-06-27 DIAGNOSIS — R2689 Other abnormalities of gait and mobility: Secondary | ICD-10-CM | POA: Diagnosis not present

## 2021-06-27 DIAGNOSIS — M6281 Muscle weakness (generalized): Secondary | ICD-10-CM | POA: Diagnosis not present

## 2021-06-29 DIAGNOSIS — M6281 Muscle weakness (generalized): Secondary | ICD-10-CM | POA: Diagnosis not present

## 2021-06-29 DIAGNOSIS — R2689 Other abnormalities of gait and mobility: Secondary | ICD-10-CM | POA: Diagnosis not present

## 2021-07-04 DIAGNOSIS — R2689 Other abnormalities of gait and mobility: Secondary | ICD-10-CM | POA: Diagnosis not present

## 2021-07-04 DIAGNOSIS — M6281 Muscle weakness (generalized): Secondary | ICD-10-CM | POA: Diagnosis not present

## 2021-07-11 DIAGNOSIS — M6281 Muscle weakness (generalized): Secondary | ICD-10-CM | POA: Diagnosis not present

## 2021-07-11 DIAGNOSIS — R2689 Other abnormalities of gait and mobility: Secondary | ICD-10-CM | POA: Diagnosis not present

## 2021-07-13 DIAGNOSIS — R2689 Other abnormalities of gait and mobility: Secondary | ICD-10-CM | POA: Diagnosis not present

## 2021-07-13 DIAGNOSIS — M6281 Muscle weakness (generalized): Secondary | ICD-10-CM | POA: Diagnosis not present

## 2021-07-20 DIAGNOSIS — R2689 Other abnormalities of gait and mobility: Secondary | ICD-10-CM | POA: Diagnosis not present

## 2021-07-20 DIAGNOSIS — M6281 Muscle weakness (generalized): Secondary | ICD-10-CM | POA: Diagnosis not present

## 2021-07-22 IMAGING — CR DG CHEST 2V
2 series · 2 of 2 positions shown · non-contrast
Comparison: 05/28/2017

CLINICAL DATA: CHF, shortness of breath

EXAM:
CHEST - 2 VIEW

[w chest pa]
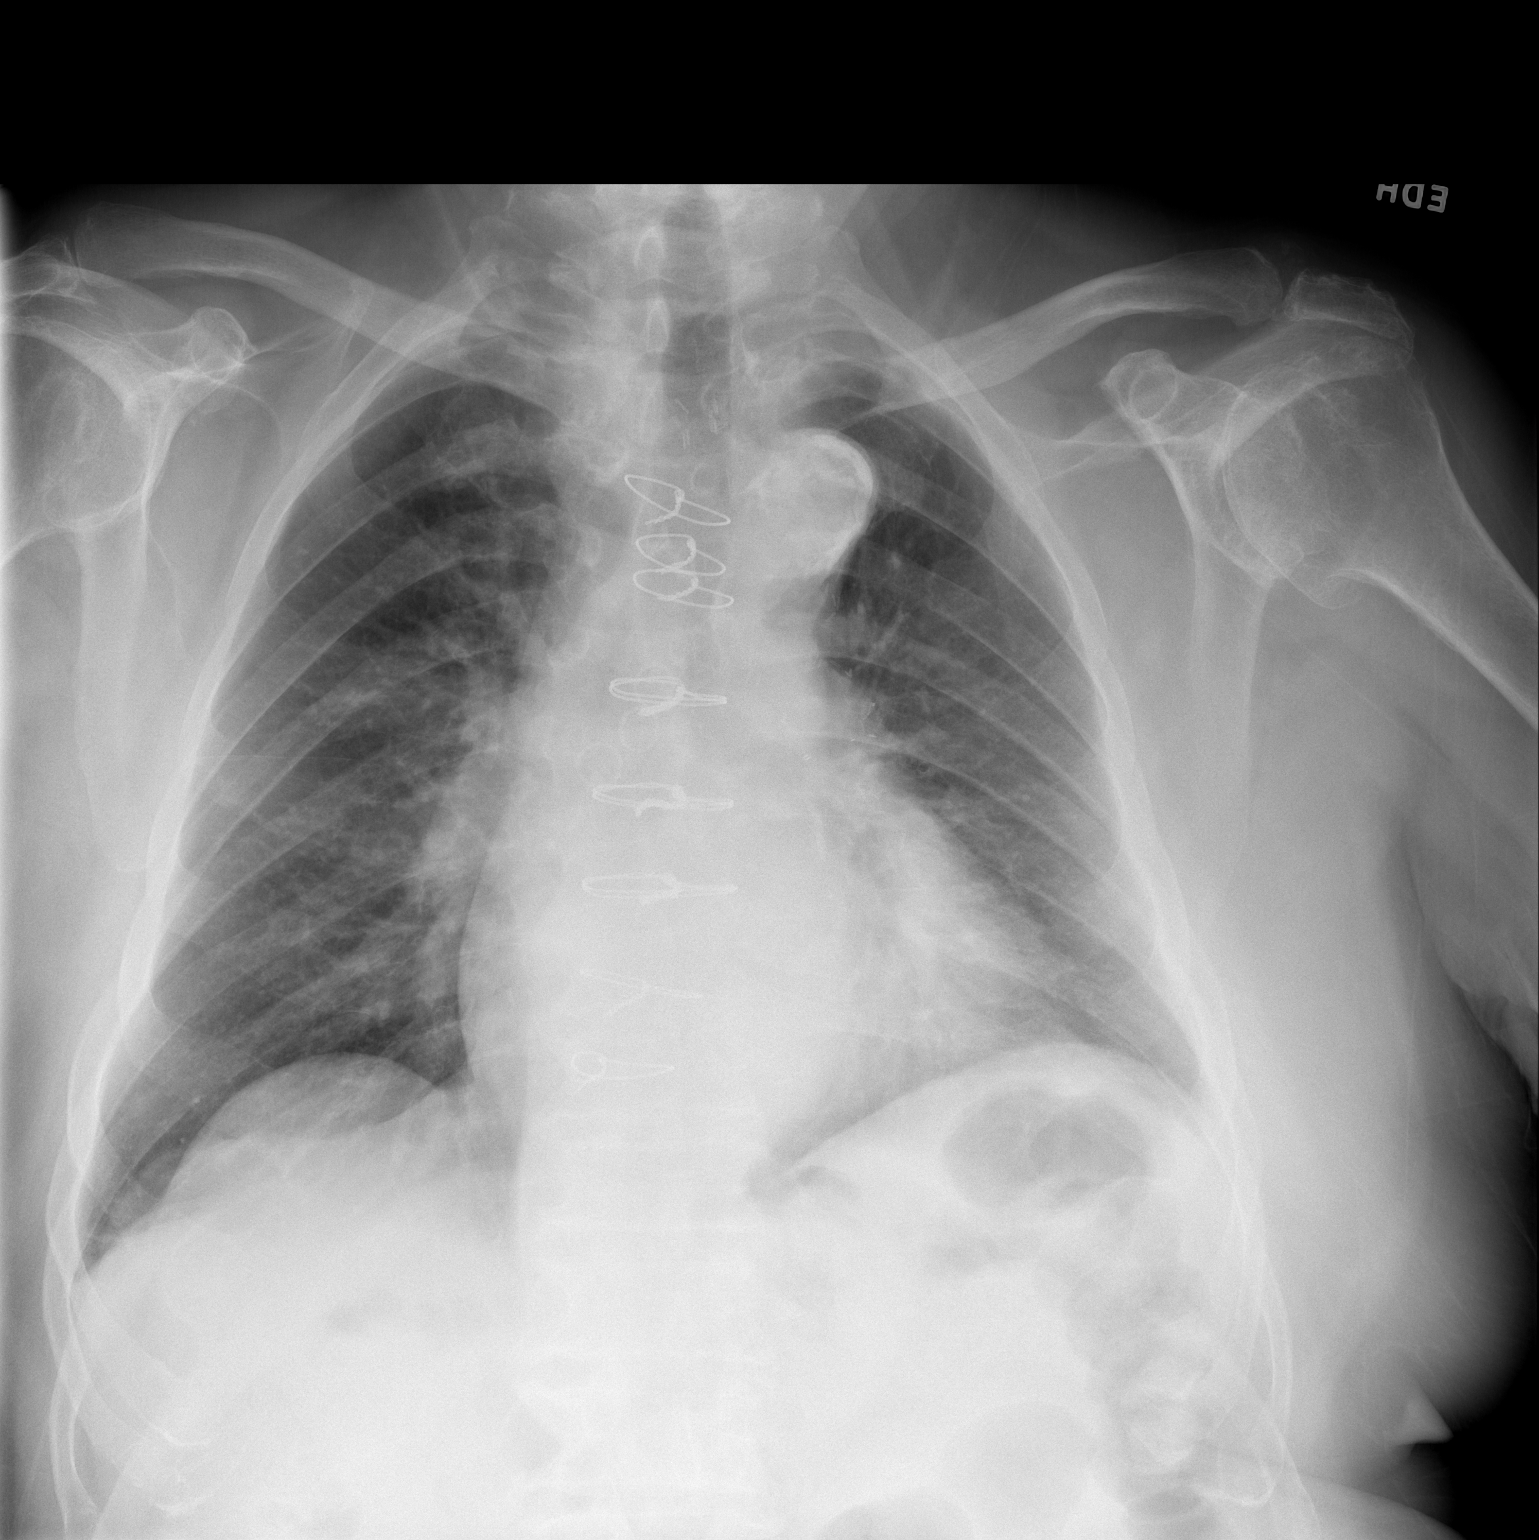

[w chest lat]
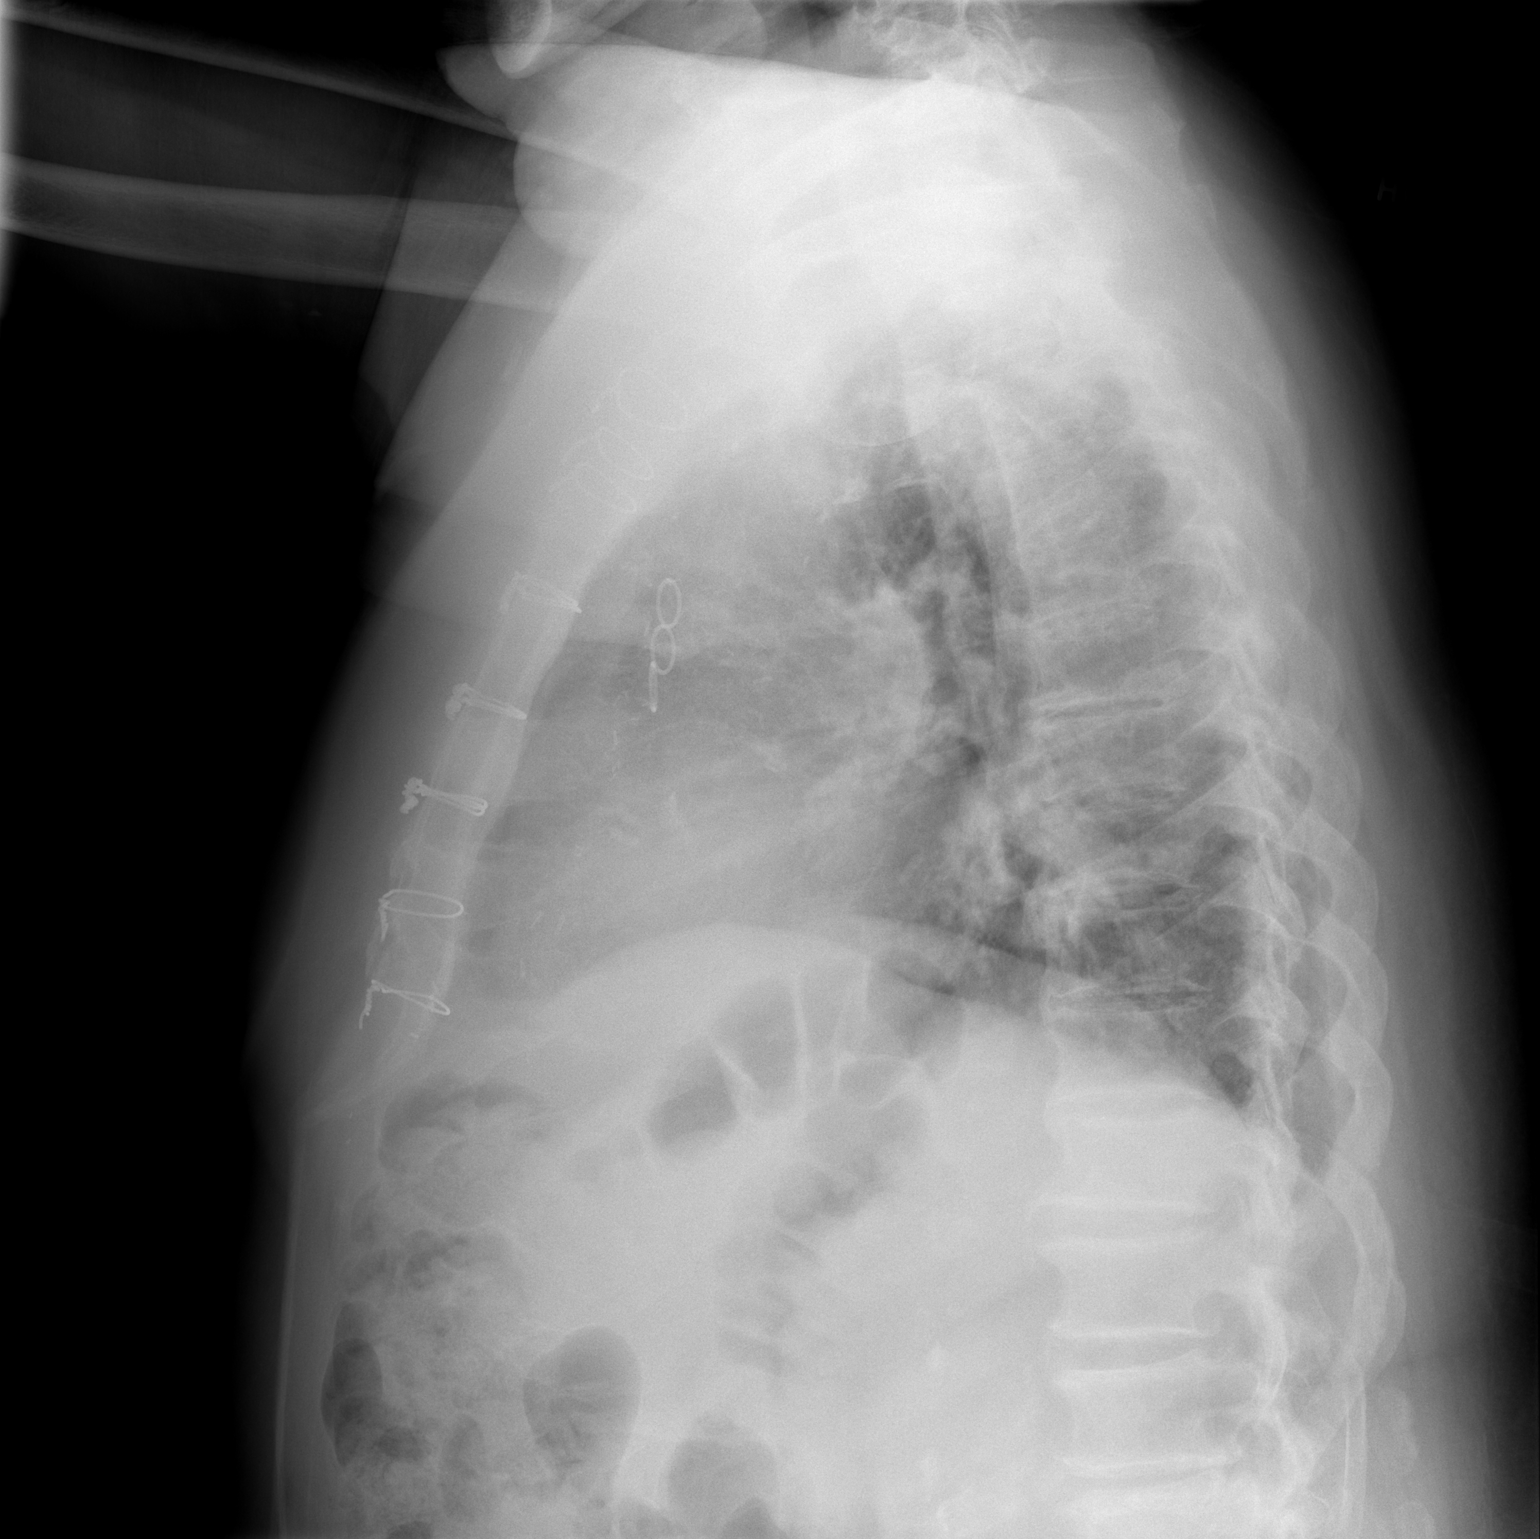

[2 of 2 positions shown; findings below may reference images not displayed]

FINDINGS: Prior CABG. Aortic atherosclerosis. Cardiomegaly. No confluent
opacities, effusions or edema. No acute bony abnormality.
IMPRESSION: Cardiomegaly.  No active disease.

## 2021-09-12 DIAGNOSIS — H401113 Primary open-angle glaucoma, right eye, severe stage: Secondary | ICD-10-CM | POA: Diagnosis not present

## 2021-09-12 DIAGNOSIS — H401122 Primary open-angle glaucoma, left eye, moderate stage: Secondary | ICD-10-CM | POA: Diagnosis not present

## 2021-09-24 DIAGNOSIS — J208 Acute bronchitis due to other specified organisms: Secondary | ICD-10-CM | POA: Diagnosis not present

## 2021-09-24 DIAGNOSIS — B9689 Other specified bacterial agents as the cause of diseases classified elsewhere: Secondary | ICD-10-CM | POA: Diagnosis not present

## 2021-09-24 DIAGNOSIS — Z6839 Body mass index (BMI) 39.0-39.9, adult: Secondary | ICD-10-CM | POA: Diagnosis not present

## 2021-10-10 DIAGNOSIS — H401122 Primary open-angle glaucoma, left eye, moderate stage: Secondary | ICD-10-CM | POA: Diagnosis not present

## 2021-10-10 DIAGNOSIS — H401113 Primary open-angle glaucoma, right eye, severe stage: Secondary | ICD-10-CM | POA: Diagnosis not present

## 2022-02-02 DIAGNOSIS — M25532 Pain in left wrist: Secondary | ICD-10-CM | POA: Diagnosis not present

## 2022-02-09 DIAGNOSIS — S52502A Unspecified fracture of the lower end of left radius, initial encounter for closed fracture: Secondary | ICD-10-CM | POA: Diagnosis not present

## 2022-02-09 DIAGNOSIS — M25532 Pain in left wrist: Secondary | ICD-10-CM | POA: Diagnosis not present

## 2022-02-09 DIAGNOSIS — R269 Unspecified abnormalities of gait and mobility: Secondary | ICD-10-CM | POA: Diagnosis not present

## 2022-02-21 DIAGNOSIS — M159 Polyosteoarthritis, unspecified: Secondary | ICD-10-CM | POA: Diagnosis not present

## 2022-02-21 DIAGNOSIS — Z6827 Body mass index (BMI) 27.0-27.9, adult: Secondary | ICD-10-CM | POA: Diagnosis not present

## 2022-02-21 DIAGNOSIS — H409 Unspecified glaucoma: Secondary | ICD-10-CM | POA: Diagnosis not present

## 2022-02-21 DIAGNOSIS — I49 Other cardiac arrhythmias: Secondary | ICD-10-CM | POA: Diagnosis not present

## 2022-02-21 DIAGNOSIS — J9601 Acute respiratory failure with hypoxia: Secondary | ICD-10-CM | POA: Diagnosis not present

## 2022-02-21 DIAGNOSIS — J90 Pleural effusion, not elsewhere classified: Secondary | ICD-10-CM | POA: Diagnosis not present

## 2022-02-21 DIAGNOSIS — Z7901 Long term (current) use of anticoagulants: Secondary | ICD-10-CM | POA: Diagnosis not present

## 2022-02-21 DIAGNOSIS — Z880 Allergy status to penicillin: Secondary | ICD-10-CM | POA: Diagnosis not present

## 2022-02-21 DIAGNOSIS — I5033 Acute on chronic diastolic (congestive) heart failure: Secondary | ICD-10-CM | POA: Diagnosis not present

## 2022-02-21 DIAGNOSIS — H5440 Blindness, one eye, unspecified eye: Secondary | ICD-10-CM | POA: Diagnosis not present

## 2022-02-21 DIAGNOSIS — I110 Hypertensive heart disease with heart failure: Secondary | ICD-10-CM | POA: Diagnosis not present

## 2022-02-21 DIAGNOSIS — R062 Wheezing: Secondary | ICD-10-CM | POA: Diagnosis not present

## 2022-02-21 DIAGNOSIS — Z79899 Other long term (current) drug therapy: Secondary | ICD-10-CM | POA: Diagnosis not present

## 2022-02-21 DIAGNOSIS — I25810 Atherosclerosis of coronary artery bypass graft(s) without angina pectoris: Secondary | ICD-10-CM | POA: Diagnosis not present

## 2022-02-21 DIAGNOSIS — J309 Allergic rhinitis, unspecified: Secondary | ICD-10-CM | POA: Diagnosis not present

## 2022-02-21 DIAGNOSIS — R000 Tachycardia, unspecified: Secondary | ICD-10-CM | POA: Diagnosis not present

## 2022-02-21 DIAGNOSIS — Z9181 History of falling: Secondary | ICD-10-CM | POA: Diagnosis not present

## 2022-02-21 DIAGNOSIS — N179 Acute kidney failure, unspecified: Secondary | ICD-10-CM | POA: Diagnosis not present

## 2022-02-21 DIAGNOSIS — Z87442 Personal history of urinary calculi: Secondary | ICD-10-CM | POA: Diagnosis not present

## 2022-02-21 DIAGNOSIS — M25532 Pain in left wrist: Secondary | ICD-10-CM | POA: Diagnosis not present

## 2022-02-21 DIAGNOSIS — M5136 Other intervertebral disc degeneration, lumbar region: Secondary | ICD-10-CM | POA: Diagnosis not present

## 2022-02-21 DIAGNOSIS — G252 Other specified forms of tremor: Secondary | ICD-10-CM | POA: Diagnosis not present

## 2022-02-21 DIAGNOSIS — M1990 Unspecified osteoarthritis, unspecified site: Secondary | ICD-10-CM | POA: Diagnosis not present

## 2022-02-21 DIAGNOSIS — M5030 Other cervical disc degeneration, unspecified cervical region: Secondary | ICD-10-CM | POA: Diagnosis not present

## 2022-02-21 DIAGNOSIS — Z951 Presence of aortocoronary bypass graft: Secondary | ICD-10-CM | POA: Diagnosis not present

## 2022-02-21 DIAGNOSIS — G4733 Obstructive sleep apnea (adult) (pediatric): Secondary | ICD-10-CM | POA: Diagnosis not present

## 2022-02-21 DIAGNOSIS — R262 Difficulty in walking, not elsewhere classified: Secondary | ICD-10-CM | POA: Diagnosis not present

## 2022-02-21 DIAGNOSIS — R269 Unspecified abnormalities of gait and mobility: Secondary | ICD-10-CM | POA: Diagnosis not present

## 2022-02-21 DIAGNOSIS — S52502D Unspecified fracture of the lower end of left radius, subsequent encounter for closed fracture with routine healing: Secondary | ICD-10-CM | POA: Diagnosis not present

## 2022-02-21 DIAGNOSIS — E038 Other specified hypothyroidism: Secondary | ICD-10-CM | POA: Diagnosis not present

## 2022-02-21 DIAGNOSIS — E7800 Pure hypercholesterolemia, unspecified: Secondary | ICD-10-CM | POA: Diagnosis not present

## 2022-02-21 DIAGNOSIS — Z7952 Long term (current) use of systemic steroids: Secondary | ICD-10-CM | POA: Diagnosis not present

## 2022-02-21 DIAGNOSIS — I5032 Chronic diastolic (congestive) heart failure: Secondary | ICD-10-CM | POA: Diagnosis not present

## 2022-02-21 DIAGNOSIS — R296 Repeated falls: Secondary | ICD-10-CM | POA: Diagnosis not present

## 2022-02-21 DIAGNOSIS — Z6837 Body mass index (BMI) 37.0-37.9, adult: Secondary | ICD-10-CM | POA: Diagnosis not present

## 2022-02-21 DIAGNOSIS — E785 Hyperlipidemia, unspecified: Secondary | ICD-10-CM | POA: Diagnosis not present

## 2022-02-21 DIAGNOSIS — Z87891 Personal history of nicotine dependence: Secondary | ICD-10-CM | POA: Diagnosis not present

## 2022-02-21 DIAGNOSIS — E875 Hyperkalemia: Secondary | ICD-10-CM | POA: Diagnosis not present

## 2022-02-21 DIAGNOSIS — I2510 Atherosclerotic heart disease of native coronary artery without angina pectoris: Secondary | ICD-10-CM | POA: Diagnosis not present

## 2022-02-21 DIAGNOSIS — E039 Hypothyroidism, unspecified: Secondary | ICD-10-CM | POA: Diagnosis not present

## 2022-02-21 DIAGNOSIS — I4892 Unspecified atrial flutter: Secondary | ICD-10-CM | POA: Diagnosis not present

## 2022-02-21 DIAGNOSIS — F32A Depression, unspecified: Secondary | ICD-10-CM | POA: Diagnosis not present

## 2022-02-21 DIAGNOSIS — M810 Age-related osteoporosis without current pathological fracture: Secondary | ICD-10-CM | POA: Diagnosis not present

## 2022-02-21 DIAGNOSIS — I10 Essential (primary) hypertension: Secondary | ICD-10-CM | POA: Diagnosis not present

## 2022-02-21 DIAGNOSIS — J810 Acute pulmonary edema: Secondary | ICD-10-CM | POA: Diagnosis not present

## 2022-02-21 DIAGNOSIS — G250 Essential tremor: Secondary | ICD-10-CM | POA: Diagnosis not present

## 2022-02-21 DIAGNOSIS — R5381 Other malaise: Secondary | ICD-10-CM | POA: Diagnosis not present

## 2022-02-21 DIAGNOSIS — Z7951 Long term (current) use of inhaled steroids: Secondary | ICD-10-CM | POA: Diagnosis not present

## 2022-02-21 DIAGNOSIS — I509 Heart failure, unspecified: Secondary | ICD-10-CM | POA: Diagnosis not present

## 2022-02-21 DIAGNOSIS — J441 Chronic obstructive pulmonary disease with (acute) exacerbation: Secondary | ICD-10-CM | POA: Diagnosis not present

## 2022-02-23 DIAGNOSIS — M810 Age-related osteoporosis without current pathological fracture: Secondary | ICD-10-CM | POA: Diagnosis not present

## 2022-02-23 DIAGNOSIS — E039 Hypothyroidism, unspecified: Secondary | ICD-10-CM | POA: Diagnosis not present

## 2022-02-23 DIAGNOSIS — R5381 Other malaise: Secondary | ICD-10-CM | POA: Diagnosis not present

## 2022-02-23 DIAGNOSIS — R262 Difficulty in walking, not elsewhere classified: Secondary | ICD-10-CM | POA: Diagnosis not present

## 2022-02-27 DIAGNOSIS — R269 Unspecified abnormalities of gait and mobility: Secondary | ICD-10-CM | POA: Diagnosis not present

## 2022-02-27 DIAGNOSIS — S52502D Unspecified fracture of the lower end of left radius, subsequent encounter for closed fracture with routine healing: Secondary | ICD-10-CM | POA: Diagnosis not present

## 2022-02-27 DIAGNOSIS — M25532 Pain in left wrist: Secondary | ICD-10-CM | POA: Diagnosis not present

## 2022-03-12 DIAGNOSIS — I5033 Acute on chronic diastolic (congestive) heart failure: Secondary | ICD-10-CM | POA: Diagnosis not present

## 2022-03-12 DIAGNOSIS — I110 Hypertensive heart disease with heart failure: Secondary | ICD-10-CM | POA: Diagnosis not present

## 2022-03-12 DIAGNOSIS — G250 Essential tremor: Secondary | ICD-10-CM | POA: Diagnosis not present

## 2022-03-12 DIAGNOSIS — R296 Repeated falls: Secondary | ICD-10-CM | POA: Diagnosis not present

## 2022-03-12 DIAGNOSIS — J90 Pleural effusion, not elsewhere classified: Secondary | ICD-10-CM | POA: Diagnosis not present

## 2022-03-12 DIAGNOSIS — I4892 Unspecified atrial flutter: Secondary | ICD-10-CM | POA: Diagnosis not present

## 2022-03-12 DIAGNOSIS — Z7952 Long term (current) use of systemic steroids: Secondary | ICD-10-CM | POA: Diagnosis not present

## 2022-03-12 DIAGNOSIS — Z6837 Body mass index (BMI) 37.0-37.9, adult: Secondary | ICD-10-CM | POA: Diagnosis not present

## 2022-03-12 DIAGNOSIS — E785 Hyperlipidemia, unspecified: Secondary | ICD-10-CM | POA: Diagnosis not present

## 2022-03-12 DIAGNOSIS — S52592A Other fractures of lower end of left radius, initial encounter for closed fracture: Secondary | ICD-10-CM | POA: Diagnosis not present

## 2022-03-12 DIAGNOSIS — Z7951 Long term (current) use of inhaled steroids: Secondary | ICD-10-CM | POA: Diagnosis not present

## 2022-03-12 DIAGNOSIS — J441 Chronic obstructive pulmonary disease with (acute) exacerbation: Secondary | ICD-10-CM | POA: Diagnosis not present

## 2022-03-12 DIAGNOSIS — J309 Allergic rhinitis, unspecified: Secondary | ICD-10-CM | POA: Diagnosis not present

## 2022-03-12 DIAGNOSIS — E038 Other specified hypothyroidism: Secondary | ICD-10-CM | POA: Diagnosis not present

## 2022-03-12 DIAGNOSIS — E875 Hyperkalemia: Secondary | ICD-10-CM | POA: Diagnosis not present

## 2022-03-12 DIAGNOSIS — J449 Chronic obstructive pulmonary disease, unspecified: Secondary | ICD-10-CM | POA: Diagnosis not present

## 2022-03-12 DIAGNOSIS — Z951 Presence of aortocoronary bypass graft: Secondary | ICD-10-CM | POA: Diagnosis not present

## 2022-03-12 DIAGNOSIS — Z9981 Dependence on supplemental oxygen: Secondary | ICD-10-CM | POA: Diagnosis not present

## 2022-03-12 DIAGNOSIS — E7800 Pure hypercholesterolemia, unspecified: Secondary | ICD-10-CM | POA: Diagnosis not present

## 2022-03-12 DIAGNOSIS — Z7401 Bed confinement status: Secondary | ICD-10-CM | POA: Diagnosis not present

## 2022-03-12 DIAGNOSIS — J9690 Respiratory failure, unspecified, unspecified whether with hypoxia or hypercapnia: Secondary | ICD-10-CM | POA: Diagnosis not present

## 2022-03-12 DIAGNOSIS — S62142D Displaced fracture of body of hamate [unciform] bone, left wrist, subsequent encounter for fracture with routine healing: Secondary | ICD-10-CM | POA: Diagnosis not present

## 2022-03-12 DIAGNOSIS — R000 Tachycardia, unspecified: Secondary | ICD-10-CM | POA: Diagnosis not present

## 2022-03-12 DIAGNOSIS — M5030 Other cervical disc degeneration, unspecified cervical region: Secondary | ICD-10-CM | POA: Diagnosis not present

## 2022-03-12 DIAGNOSIS — Z880 Allergy status to penicillin: Secondary | ICD-10-CM | POA: Diagnosis not present

## 2022-03-12 DIAGNOSIS — G4733 Obstructive sleep apnea (adult) (pediatric): Secondary | ICD-10-CM | POA: Diagnosis not present

## 2022-03-12 DIAGNOSIS — Z87891 Personal history of nicotine dependence: Secondary | ICD-10-CM | POA: Diagnosis not present

## 2022-03-12 DIAGNOSIS — I2510 Atherosclerotic heart disease of native coronary artery without angina pectoris: Secondary | ICD-10-CM | POA: Diagnosis not present

## 2022-03-12 DIAGNOSIS — I25810 Atherosclerosis of coronary artery bypass graft(s) without angina pectoris: Secondary | ICD-10-CM | POA: Diagnosis not present

## 2022-03-12 DIAGNOSIS — M159 Polyosteoarthritis, unspecified: Secondary | ICD-10-CM | POA: Diagnosis not present

## 2022-03-12 DIAGNOSIS — M810 Age-related osteoporosis without current pathological fracture: Secondary | ICD-10-CM | POA: Diagnosis not present

## 2022-03-12 DIAGNOSIS — R0602 Shortness of breath: Secondary | ICD-10-CM | POA: Diagnosis not present

## 2022-03-12 DIAGNOSIS — R062 Wheezing: Secondary | ICD-10-CM | POA: Diagnosis not present

## 2022-03-12 DIAGNOSIS — J9620 Acute and chronic respiratory failure, unspecified whether with hypoxia or hypercapnia: Secondary | ICD-10-CM | POA: Diagnosis not present

## 2022-03-12 DIAGNOSIS — Z6827 Body mass index (BMI) 27.0-27.9, adult: Secondary | ICD-10-CM | POA: Diagnosis not present

## 2022-03-12 DIAGNOSIS — I509 Heart failure, unspecified: Secondary | ICD-10-CM | POA: Diagnosis not present

## 2022-03-12 DIAGNOSIS — M25532 Pain in left wrist: Secondary | ICD-10-CM | POA: Diagnosis not present

## 2022-03-12 DIAGNOSIS — M1990 Unspecified osteoarthritis, unspecified site: Secondary | ICD-10-CM | POA: Diagnosis not present

## 2022-03-12 DIAGNOSIS — M5136 Other intervertebral disc degeneration, lumbar region: Secondary | ICD-10-CM | POA: Diagnosis not present

## 2022-03-12 DIAGNOSIS — J9601 Acute respiratory failure with hypoxia: Secondary | ICD-10-CM | POA: Diagnosis not present

## 2022-03-12 DIAGNOSIS — Z9181 History of falling: Secondary | ICD-10-CM | POA: Diagnosis not present

## 2022-03-12 DIAGNOSIS — Z79899 Other long term (current) drug therapy: Secondary | ICD-10-CM | POA: Diagnosis not present

## 2022-03-12 DIAGNOSIS — J811 Chronic pulmonary edema: Secondary | ICD-10-CM | POA: Diagnosis not present

## 2022-03-12 DIAGNOSIS — Z87442 Personal history of urinary calculi: Secondary | ICD-10-CM | POA: Diagnosis not present

## 2022-03-12 DIAGNOSIS — N179 Acute kidney failure, unspecified: Secondary | ICD-10-CM | POA: Diagnosis not present

## 2022-03-12 DIAGNOSIS — G252 Other specified forms of tremor: Secondary | ICD-10-CM | POA: Diagnosis not present

## 2022-03-12 DIAGNOSIS — F32A Depression, unspecified: Secondary | ICD-10-CM | POA: Diagnosis not present

## 2022-03-12 DIAGNOSIS — H409 Unspecified glaucoma: Secondary | ICD-10-CM | POA: Diagnosis not present

## 2022-03-12 DIAGNOSIS — I10 Essential (primary) hypertension: Secondary | ICD-10-CM | POA: Diagnosis not present

## 2022-03-12 DIAGNOSIS — R0600 Dyspnea, unspecified: Secondary | ICD-10-CM | POA: Diagnosis not present

## 2022-03-12 DIAGNOSIS — J810 Acute pulmonary edema: Secondary | ICD-10-CM | POA: Diagnosis not present

## 2022-03-12 DIAGNOSIS — E039 Hypothyroidism, unspecified: Secondary | ICD-10-CM | POA: Diagnosis not present

## 2022-03-12 DIAGNOSIS — Z7901 Long term (current) use of anticoagulants: Secondary | ICD-10-CM | POA: Diagnosis not present

## 2022-03-12 DIAGNOSIS — I5032 Chronic diastolic (congestive) heart failure: Secondary | ICD-10-CM | POA: Diagnosis not present

## 2022-03-13 DIAGNOSIS — I4892 Unspecified atrial flutter: Secondary | ICD-10-CM

## 2022-03-13 DIAGNOSIS — I2510 Atherosclerotic heart disease of native coronary artery without angina pectoris: Secondary | ICD-10-CM

## 2022-03-13 DIAGNOSIS — E785 Hyperlipidemia, unspecified: Secondary | ICD-10-CM

## 2022-03-13 DIAGNOSIS — I10 Essential (primary) hypertension: Secondary | ICD-10-CM

## 2022-03-13 DIAGNOSIS — I509 Heart failure, unspecified: Secondary | ICD-10-CM

## 2022-03-14 DIAGNOSIS — I4892 Unspecified atrial flutter: Secondary | ICD-10-CM | POA: Diagnosis not present

## 2022-03-14 DIAGNOSIS — I10 Essential (primary) hypertension: Secondary | ICD-10-CM | POA: Diagnosis not present

## 2022-03-14 DIAGNOSIS — I2510 Atherosclerotic heart disease of native coronary artery without angina pectoris: Secondary | ICD-10-CM | POA: Diagnosis not present

## 2022-03-14 DIAGNOSIS — E785 Hyperlipidemia, unspecified: Secondary | ICD-10-CM | POA: Diagnosis not present

## 2022-03-15 DIAGNOSIS — I10 Essential (primary) hypertension: Secondary | ICD-10-CM | POA: Diagnosis not present

## 2022-03-15 DIAGNOSIS — E785 Hyperlipidemia, unspecified: Secondary | ICD-10-CM | POA: Diagnosis not present

## 2022-03-15 DIAGNOSIS — I4892 Unspecified atrial flutter: Secondary | ICD-10-CM | POA: Diagnosis not present

## 2022-03-15 DIAGNOSIS — I2510 Atherosclerotic heart disease of native coronary artery without angina pectoris: Secondary | ICD-10-CM | POA: Diagnosis not present

## 2022-03-16 DIAGNOSIS — I2510 Atherosclerotic heart disease of native coronary artery without angina pectoris: Secondary | ICD-10-CM | POA: Diagnosis not present

## 2022-03-16 DIAGNOSIS — I4892 Unspecified atrial flutter: Secondary | ICD-10-CM | POA: Diagnosis not present

## 2022-03-16 DIAGNOSIS — I10 Essential (primary) hypertension: Secondary | ICD-10-CM | POA: Diagnosis not present

## 2022-03-16 DIAGNOSIS — E785 Hyperlipidemia, unspecified: Secondary | ICD-10-CM | POA: Diagnosis not present

## 2022-03-17 DIAGNOSIS — I509 Heart failure, unspecified: Secondary | ICD-10-CM

## 2022-03-17 DIAGNOSIS — I2510 Atherosclerotic heart disease of native coronary artery without angina pectoris: Secondary | ICD-10-CM

## 2022-03-17 DIAGNOSIS — E785 Hyperlipidemia, unspecified: Secondary | ICD-10-CM | POA: Diagnosis not present

## 2022-03-17 DIAGNOSIS — I4892 Unspecified atrial flutter: Secondary | ICD-10-CM

## 2022-03-17 DIAGNOSIS — I10 Essential (primary) hypertension: Secondary | ICD-10-CM

## 2022-03-18 DIAGNOSIS — E785 Hyperlipidemia, unspecified: Secondary | ICD-10-CM | POA: Diagnosis not present

## 2022-03-18 DIAGNOSIS — I4892 Unspecified atrial flutter: Secondary | ICD-10-CM | POA: Diagnosis not present

## 2022-03-18 DIAGNOSIS — I2510 Atherosclerotic heart disease of native coronary artery without angina pectoris: Secondary | ICD-10-CM | POA: Diagnosis not present

## 2022-03-18 DIAGNOSIS — I10 Essential (primary) hypertension: Secondary | ICD-10-CM | POA: Diagnosis not present

## 2022-03-19 DIAGNOSIS — I2510 Atherosclerotic heart disease of native coronary artery without angina pectoris: Secondary | ICD-10-CM | POA: Diagnosis not present

## 2022-03-19 DIAGNOSIS — E785 Hyperlipidemia, unspecified: Secondary | ICD-10-CM | POA: Diagnosis not present

## 2022-03-19 DIAGNOSIS — I10 Essential (primary) hypertension: Secondary | ICD-10-CM | POA: Diagnosis not present

## 2022-03-19 DIAGNOSIS — I4892 Unspecified atrial flutter: Secondary | ICD-10-CM | POA: Diagnosis not present

## 2022-03-20 DIAGNOSIS — M5030 Other cervical disc degeneration, unspecified cervical region: Secondary | ICD-10-CM | POA: Diagnosis not present

## 2022-03-20 DIAGNOSIS — J810 Acute pulmonary edema: Secondary | ICD-10-CM | POA: Diagnosis not present

## 2022-03-20 DIAGNOSIS — J449 Chronic obstructive pulmonary disease, unspecified: Secondary | ICD-10-CM | POA: Diagnosis not present

## 2022-03-20 DIAGNOSIS — Z6837 Body mass index (BMI) 37.0-37.9, adult: Secondary | ICD-10-CM | POA: Diagnosis not present

## 2022-03-20 DIAGNOSIS — G250 Essential tremor: Secondary | ICD-10-CM | POA: Diagnosis not present

## 2022-03-20 DIAGNOSIS — Z79899 Other long term (current) drug therapy: Secondary | ICD-10-CM | POA: Diagnosis not present

## 2022-03-20 DIAGNOSIS — J9601 Acute respiratory failure with hypoxia: Secondary | ICD-10-CM | POA: Diagnosis not present

## 2022-03-20 DIAGNOSIS — S52502D Unspecified fracture of the lower end of left radius, subsequent encounter for closed fracture with routine healing: Secondary | ICD-10-CM | POA: Diagnosis not present

## 2022-03-20 DIAGNOSIS — J309 Allergic rhinitis, unspecified: Secondary | ICD-10-CM | POA: Diagnosis not present

## 2022-03-20 DIAGNOSIS — I25810 Atherosclerosis of coronary artery bypass graft(s) without angina pectoris: Secondary | ICD-10-CM | POA: Diagnosis not present

## 2022-03-20 DIAGNOSIS — E038 Other specified hypothyroidism: Secondary | ICD-10-CM | POA: Diagnosis not present

## 2022-03-20 DIAGNOSIS — Z7401 Bed confinement status: Secondary | ICD-10-CM | POA: Diagnosis not present

## 2022-03-20 DIAGNOSIS — M5136 Other intervertebral disc degeneration, lumbar region: Secondary | ICD-10-CM | POA: Diagnosis not present

## 2022-03-20 DIAGNOSIS — I5032 Chronic diastolic (congestive) heart failure: Secondary | ICD-10-CM | POA: Diagnosis not present

## 2022-03-20 DIAGNOSIS — E875 Hyperkalemia: Secondary | ICD-10-CM | POA: Diagnosis not present

## 2022-03-20 DIAGNOSIS — I509 Heart failure, unspecified: Secondary | ICD-10-CM | POA: Diagnosis not present

## 2022-03-20 DIAGNOSIS — G4733 Obstructive sleep apnea (adult) (pediatric): Secondary | ICD-10-CM | POA: Diagnosis not present

## 2022-03-20 DIAGNOSIS — R946 Abnormal results of thyroid function studies: Secondary | ICD-10-CM | POA: Diagnosis not present

## 2022-03-20 DIAGNOSIS — I4891 Unspecified atrial fibrillation: Secondary | ICD-10-CM | POA: Diagnosis not present

## 2022-03-20 DIAGNOSIS — R296 Repeated falls: Secondary | ICD-10-CM | POA: Diagnosis not present

## 2022-03-20 DIAGNOSIS — I5033 Acute on chronic diastolic (congestive) heart failure: Secondary | ICD-10-CM | POA: Diagnosis not present

## 2022-03-20 DIAGNOSIS — S91302A Unspecified open wound, left foot, initial encounter: Secondary | ICD-10-CM | POA: Diagnosis not present

## 2022-03-20 DIAGNOSIS — S62142D Displaced fracture of body of hamate [unciform] bone, left wrist, subsequent encounter for fracture with routine healing: Secondary | ICD-10-CM | POA: Diagnosis not present

## 2022-03-20 DIAGNOSIS — R0602 Shortness of breath: Secondary | ICD-10-CM | POA: Diagnosis not present

## 2022-03-20 DIAGNOSIS — J189 Pneumonia, unspecified organism: Secondary | ICD-10-CM | POA: Diagnosis not present

## 2022-03-20 DIAGNOSIS — J811 Chronic pulmonary edema: Secondary | ICD-10-CM | POA: Diagnosis not present

## 2022-03-20 DIAGNOSIS — J9621 Acute and chronic respiratory failure with hypoxia: Secondary | ICD-10-CM | POA: Diagnosis not present

## 2022-03-20 DIAGNOSIS — Z9981 Dependence on supplemental oxygen: Secondary | ICD-10-CM | POA: Diagnosis not present

## 2022-03-20 DIAGNOSIS — E785 Hyperlipidemia, unspecified: Secondary | ICD-10-CM | POA: Diagnosis not present

## 2022-03-20 DIAGNOSIS — I110 Hypertensive heart disease with heart failure: Secondary | ICD-10-CM | POA: Diagnosis not present

## 2022-03-20 DIAGNOSIS — M159 Polyosteoarthritis, unspecified: Secondary | ICD-10-CM | POA: Diagnosis not present

## 2022-03-20 DIAGNOSIS — J441 Chronic obstructive pulmonary disease with (acute) exacerbation: Secondary | ICD-10-CM | POA: Diagnosis not present

## 2022-03-20 DIAGNOSIS — J9620 Acute and chronic respiratory failure, unspecified whether with hypoxia or hypercapnia: Secondary | ICD-10-CM | POA: Diagnosis not present

## 2022-03-20 DIAGNOSIS — M25532 Pain in left wrist: Secondary | ICD-10-CM | POA: Diagnosis not present

## 2022-03-22 DIAGNOSIS — J9621 Acute and chronic respiratory failure with hypoxia: Secondary | ICD-10-CM | POA: Diagnosis not present

## 2022-03-22 DIAGNOSIS — I509 Heart failure, unspecified: Secondary | ICD-10-CM | POA: Diagnosis not present

## 2022-03-22 DIAGNOSIS — I4891 Unspecified atrial fibrillation: Secondary | ICD-10-CM | POA: Diagnosis not present

## 2022-03-22 DIAGNOSIS — J189 Pneumonia, unspecified organism: Secondary | ICD-10-CM | POA: Diagnosis not present

## 2022-03-29 DIAGNOSIS — S91302A Unspecified open wound, left foot, initial encounter: Secondary | ICD-10-CM | POA: Diagnosis not present

## 2022-04-05 DIAGNOSIS — M25532 Pain in left wrist: Secondary | ICD-10-CM | POA: Diagnosis not present

## 2022-04-05 DIAGNOSIS — S52502D Unspecified fracture of the lower end of left radius, subsequent encounter for closed fracture with routine healing: Secondary | ICD-10-CM | POA: Diagnosis not present

## 2022-04-12 DIAGNOSIS — S91302A Unspecified open wound, left foot, initial encounter: Secondary | ICD-10-CM | POA: Diagnosis not present

## 2022-04-18 DIAGNOSIS — Z79899 Other long term (current) drug therapy: Secondary | ICD-10-CM | POA: Diagnosis not present

## 2022-04-19 DIAGNOSIS — L988 Other specified disorders of the skin and subcutaneous tissue: Secondary | ICD-10-CM | POA: Diagnosis not present

## 2022-04-23 DIAGNOSIS — L89610 Pressure ulcer of right heel, unstageable: Secondary | ICD-10-CM | POA: Diagnosis not present

## 2022-04-23 DIAGNOSIS — I4891 Unspecified atrial fibrillation: Secondary | ICD-10-CM | POA: Diagnosis not present

## 2022-04-23 DIAGNOSIS — I2510 Atherosclerotic heart disease of native coronary artery without angina pectoris: Secondary | ICD-10-CM | POA: Diagnosis not present

## 2022-04-23 DIAGNOSIS — I5032 Chronic diastolic (congestive) heart failure: Secondary | ICD-10-CM | POA: Diagnosis not present

## 2022-04-23 DIAGNOSIS — R5381 Other malaise: Secondary | ICD-10-CM | POA: Diagnosis not present

## 2022-04-23 DIAGNOSIS — R2681 Unsteadiness on feet: Secondary | ICD-10-CM | POA: Diagnosis not present

## 2022-04-23 DIAGNOSIS — G250 Essential tremor: Secondary | ICD-10-CM | POA: Diagnosis not present

## 2022-04-26 DIAGNOSIS — L988 Other specified disorders of the skin and subcutaneous tissue: Secondary | ICD-10-CM | POA: Diagnosis not present

## 2022-04-26 DIAGNOSIS — I5032 Chronic diastolic (congestive) heart failure: Secondary | ICD-10-CM | POA: Diagnosis not present

## 2022-04-28 DIAGNOSIS — R062 Wheezing: Secondary | ICD-10-CM | POA: Diagnosis not present

## 2022-04-30 DIAGNOSIS — R0600 Dyspnea, unspecified: Secondary | ICD-10-CM | POA: Diagnosis not present

## 2022-04-30 DIAGNOSIS — I4891 Unspecified atrial fibrillation: Secondary | ICD-10-CM | POA: Diagnosis not present

## 2022-04-30 DIAGNOSIS — R062 Wheezing: Secondary | ICD-10-CM | POA: Diagnosis not present

## 2022-04-30 DIAGNOSIS — I2510 Atherosclerotic heart disease of native coronary artery without angina pectoris: Secondary | ICD-10-CM | POA: Diagnosis not present

## 2022-05-01 ENCOUNTER — Other Ambulatory Visit: Payer: Self-pay

## 2022-05-01 ENCOUNTER — Encounter (HOSPITAL_COMMUNITY): Payer: Self-pay | Admitting: Emergency Medicine

## 2022-05-01 ENCOUNTER — Inpatient Hospital Stay (HOSPITAL_COMMUNITY): Payer: PPO

## 2022-05-01 ENCOUNTER — Inpatient Hospital Stay (HOSPITAL_COMMUNITY)
Admission: EM | Admit: 2022-05-01 | Discharge: 2022-05-09 | DRG: 291 | Disposition: A | Discharge status: Skilled Nursing Facility | Payer: PPO | Source: Skilled Nursing Facility | Attending: Internal Medicine | Admitting: Internal Medicine

## 2022-05-01 ENCOUNTER — Emergency Department (HOSPITAL_COMMUNITY): Payer: PPO

## 2022-05-01 DIAGNOSIS — L89151 Pressure ulcer of sacral region, stage 1: Secondary | ICD-10-CM | POA: Diagnosis not present

## 2022-05-01 DIAGNOSIS — D649 Anemia, unspecified: Secondary | ICD-10-CM | POA: Diagnosis not present

## 2022-05-01 DIAGNOSIS — J9622 Acute and chronic respiratory failure with hypercapnia: Secondary | ICD-10-CM | POA: Diagnosis present

## 2022-05-01 DIAGNOSIS — Z8249 Family history of ischemic heart disease and other diseases of the circulatory system: Secondary | ICD-10-CM | POA: Diagnosis not present

## 2022-05-01 DIAGNOSIS — G250 Essential tremor: Secondary | ICD-10-CM | POA: Diagnosis not present

## 2022-05-01 DIAGNOSIS — I700 Atherosclerosis of aorta: Secondary | ICD-10-CM | POA: Diagnosis not present

## 2022-05-01 DIAGNOSIS — R0689 Other abnormalities of breathing: Secondary | ICD-10-CM | POA: Diagnosis not present

## 2022-05-01 DIAGNOSIS — I2510 Atherosclerotic heart disease of native coronary artery without angina pectoris: Secondary | ICD-10-CM | POA: Diagnosis not present

## 2022-05-01 DIAGNOSIS — G9341 Metabolic encephalopathy: Secondary | ICD-10-CM | POA: Diagnosis present

## 2022-05-01 DIAGNOSIS — J690 Pneumonitis due to inhalation of food and vomit: Secondary | ICD-10-CM | POA: Diagnosis present

## 2022-05-01 DIAGNOSIS — R001 Bradycardia, unspecified: Secondary | ICD-10-CM | POA: Diagnosis present

## 2022-05-01 DIAGNOSIS — Z79899 Other long term (current) drug therapy: Secondary | ICD-10-CM | POA: Diagnosis not present

## 2022-05-01 DIAGNOSIS — Z87891 Personal history of nicotine dependence: Secondary | ICD-10-CM | POA: Diagnosis not present

## 2022-05-01 DIAGNOSIS — I5033 Acute on chronic diastolic (congestive) heart failure: Secondary | ICD-10-CM | POA: Diagnosis present

## 2022-05-01 DIAGNOSIS — E669 Obesity, unspecified: Secondary | ICD-10-CM | POA: Diagnosis present

## 2022-05-01 DIAGNOSIS — Z6838 Body mass index (BMI) 38.0-38.9, adult: Secondary | ICD-10-CM

## 2022-05-01 DIAGNOSIS — Z79890 Hormone replacement therapy (postmenopausal): Secondary | ICD-10-CM

## 2022-05-01 DIAGNOSIS — G4733 Obstructive sleep apnea (adult) (pediatric): Secondary | ICD-10-CM | POA: Diagnosis not present

## 2022-05-01 DIAGNOSIS — I2583 Coronary atherosclerosis due to lipid rich plaque: Secondary | ICD-10-CM | POA: Diagnosis not present

## 2022-05-01 DIAGNOSIS — E8729 Other acidosis: Secondary | ICD-10-CM | POA: Diagnosis not present

## 2022-05-01 DIAGNOSIS — Z66 Do not resuscitate: Secondary | ICD-10-CM | POA: Diagnosis present

## 2022-05-01 DIAGNOSIS — J9811 Atelectasis: Secondary | ICD-10-CM | POA: Diagnosis not present

## 2022-05-01 DIAGNOSIS — Z7401 Bed confinement status: Secondary | ICD-10-CM | POA: Diagnosis not present

## 2022-05-01 DIAGNOSIS — Z515 Encounter for palliative care: Secondary | ICD-10-CM | POA: Diagnosis not present

## 2022-05-01 DIAGNOSIS — J9602 Acute respiratory failure with hypercapnia: Secondary | ICD-10-CM | POA: Diagnosis not present

## 2022-05-01 DIAGNOSIS — L89612 Pressure ulcer of right heel, stage 2: Secondary | ICD-10-CM | POA: Diagnosis present

## 2022-05-01 DIAGNOSIS — J9621 Acute and chronic respiratory failure with hypoxia: Secondary | ICD-10-CM | POA: Diagnosis not present

## 2022-05-01 DIAGNOSIS — Z7982 Long term (current) use of aspirin: Secondary | ICD-10-CM

## 2022-05-01 DIAGNOSIS — J9601 Acute respiratory failure with hypoxia: Secondary | ICD-10-CM

## 2022-05-01 DIAGNOSIS — R4182 Altered mental status, unspecified: Secondary | ICD-10-CM | POA: Diagnosis not present

## 2022-05-01 DIAGNOSIS — J449 Chronic obstructive pulmonary disease, unspecified: Secondary | ICD-10-CM | POA: Diagnosis present

## 2022-05-01 DIAGNOSIS — R0603 Acute respiratory distress: Principal | ICD-10-CM

## 2022-05-01 DIAGNOSIS — I10 Essential (primary) hypertension: Secondary | ICD-10-CM | POA: Diagnosis not present

## 2022-05-01 DIAGNOSIS — I480 Paroxysmal atrial fibrillation: Secondary | ICD-10-CM | POA: Diagnosis present

## 2022-05-01 DIAGNOSIS — R609 Edema, unspecified: Secondary | ICD-10-CM | POA: Diagnosis not present

## 2022-05-01 DIAGNOSIS — J80 Acute respiratory distress syndrome: Secondary | ICD-10-CM | POA: Diagnosis not present

## 2022-05-01 DIAGNOSIS — Z20822 Contact with and (suspected) exposure to covid-19: Secondary | ICD-10-CM | POA: Diagnosis present

## 2022-05-01 DIAGNOSIS — J441 Chronic obstructive pulmonary disease with (acute) exacerbation: Secondary | ICD-10-CM | POA: Diagnosis not present

## 2022-05-01 DIAGNOSIS — Z951 Presence of aortocoronary bypass graft: Secondary | ICD-10-CM

## 2022-05-01 DIAGNOSIS — I110 Hypertensive heart disease with heart failure: Secondary | ICD-10-CM | POA: Diagnosis not present

## 2022-05-01 DIAGNOSIS — R531 Weakness: Secondary | ICD-10-CM | POA: Diagnosis not present

## 2022-05-01 DIAGNOSIS — J90 Pleural effusion, not elsewhere classified: Secondary | ICD-10-CM | POA: Diagnosis not present

## 2022-05-01 DIAGNOSIS — I5032 Chronic diastolic (congestive) heart failure: Secondary | ICD-10-CM | POA: Diagnosis not present

## 2022-05-01 DIAGNOSIS — Z7189 Other specified counseling: Secondary | ICD-10-CM | POA: Diagnosis not present

## 2022-05-01 DIAGNOSIS — Z7901 Long term (current) use of anticoagulants: Secondary | ICD-10-CM

## 2022-05-01 DIAGNOSIS — I4892 Unspecified atrial flutter: Secondary | ICD-10-CM | POA: Diagnosis not present

## 2022-05-01 DIAGNOSIS — L8990 Pressure ulcer of unspecified site, unspecified stage: Secondary | ICD-10-CM | POA: Diagnosis present

## 2022-05-01 DIAGNOSIS — R0602 Shortness of breath: Secondary | ICD-10-CM | POA: Diagnosis not present

## 2022-05-01 DIAGNOSIS — E66812 Obesity, class 2: Secondary | ICD-10-CM | POA: Diagnosis present

## 2022-05-01 DIAGNOSIS — J811 Chronic pulmonary edema: Secondary | ICD-10-CM | POA: Diagnosis not present

## 2022-05-01 DIAGNOSIS — Z7951 Long term (current) use of inhaled steroids: Secondary | ICD-10-CM

## 2022-05-01 LAB — I-STAT ARTERIAL BLOOD GAS, ED
Acid-Base Excess: 3 mmol/L — ABNORMAL HIGH (ref 0.0–2.0)
Acid-Base Excess: 4 mmol/L — ABNORMAL HIGH (ref 0.0–2.0)
Bicarbonate: 31.8 mmol/L — ABNORMAL HIGH (ref 20.0–28.0)
Bicarbonate: 31.3 mmol/L — ABNORMAL HIGH (ref 20.0–28.0)
Calcium, Ion: 1.21 mmol/L (ref 1.15–1.40)
Calcium, Ion: 1.16 mmol/L (ref 1.15–1.40)
HCT: 36 % — ABNORMAL LOW (ref 39.0–52.0)
HCT: 34 % — ABNORMAL LOW (ref 39.0–52.0)
Hemoglobin: 12.2 g/dL — ABNORMAL LOW (ref 13.0–17.0)
Hemoglobin: 11.6 g/dL — ABNORMAL LOW (ref 13.0–17.0)
O2 Saturation: 100 %
O2 Saturation: 93 %
Patient temperature: 98.6
Potassium: 4.4 mmol/L (ref 3.5–5.1)
Potassium: 4.5 mmol/L (ref 3.5–5.1)
Sodium: 135 mmol/L (ref 135–145)
Sodium: 133 mmol/L — ABNORMAL LOW (ref 135–145)
TCO2: 34 mmol/L — ABNORMAL HIGH (ref 22–32)
TCO2: 33 mmol/L — ABNORMAL HIGH (ref 22–32)
pCO2 arterial: 69.4 mmHg (ref 32–48)
pCO2 arterial: 61.5 mmHg — ABNORMAL HIGH (ref 32–48)
pH, Arterial: 7.269 — ABNORMAL LOW (ref 7.35–7.45)
pH, Arterial: 7.315 — ABNORMAL LOW (ref 7.35–7.45)
pO2, Arterial: 266 mmHg — ABNORMAL HIGH (ref 83–108)
pO2, Arterial: 75 mmHg — ABNORMAL LOW (ref 83–108)

## 2022-05-01 LAB — I-STAT VENOUS BLOOD GAS, ED
Acid-Base Excess: 2 mmol/L (ref 0.0–2.0)
Bicarbonate: 30.3 mmol/L — ABNORMAL HIGH (ref 20.0–28.0)
Calcium, Ion: 1.14 mmol/L — ABNORMAL LOW (ref 1.15–1.40)
HCT: 34 % — ABNORMAL LOW (ref 39.0–52.0)
Hemoglobin: 11.6 g/dL — ABNORMAL LOW (ref 13.0–17.0)
O2 Saturation: 66 %
Potassium: 4.2 mmol/L (ref 3.5–5.1)
Sodium: 134 mmol/L — ABNORMAL LOW (ref 135–145)
TCO2: 32 mmol/L (ref 22–32)
pCO2, Ven: 67.5 mmHg — ABNORMAL HIGH (ref 44–60)
pH, Ven: 7.261 (ref 7.25–7.43)
pO2, Ven: 41 mmHg (ref 32–45)

## 2022-05-01 LAB — CBC WITH DIFFERENTIAL/PLATELET
Abs Immature Granulocytes: 0.05 10*3/uL (ref 0.00–0.07)
Basophils Absolute: 0.1 10*3/uL (ref 0.0–0.1)
Basophils Relative: 1 %
Eosinophils Absolute: 0.4 10*3/uL (ref 0.0–0.5)
Eosinophils Relative: 5 %
HCT: 35.2 % — ABNORMAL LOW (ref 39.0–52.0)
Hemoglobin: 11.8 g/dL — ABNORMAL LOW (ref 13.0–17.0)
Immature Granulocytes: 1 %
Lymphocytes Relative: 16 %
Lymphs Abs: 1.1 10*3/uL (ref 0.7–4.0)
MCH: 35.3 pg — ABNORMAL HIGH (ref 26.0–34.0)
MCHC: 33.5 g/dL (ref 30.0–36.0)
MCV: 105.4 fL — ABNORMAL HIGH (ref 80.0–100.0)
Monocytes Absolute: 0.7 10*3/uL (ref 0.1–1.0)
Monocytes Relative: 10 %
Neutro Abs: 4.7 10*3/uL (ref 1.7–7.7)
Neutrophils Relative %: 67 %
Platelets: 156 10*3/uL (ref 150–400)
RBC: 3.34 MIL/uL — ABNORMAL LOW (ref 4.22–5.81)
RDW: 14.9 % (ref 11.5–15.5)
WBC: 7 10*3/uL (ref 4.0–10.5)
nRBC: 0 % (ref 0.0–0.2)

## 2022-05-01 LAB — COMPREHENSIVE METABOLIC PANEL
ALT: 20 U/L (ref 0–44)
AST: 27 U/L (ref 15–41)
Albumin: 3.2 g/dL — ABNORMAL LOW (ref 3.5–5.0)
Alkaline Phosphatase: 63 U/L (ref 38–126)
Anion gap: 8 (ref 5–15)
BUN: 18 mg/dL (ref 8–23)
CO2: 28 mmol/L (ref 22–32)
Calcium: 8.5 mg/dL — ABNORMAL LOW (ref 8.9–10.3)
Chloride: 99 mmol/L (ref 98–111)
Creatinine, Ser: 1.24 mg/dL (ref 0.61–1.24)
GFR, Estimated: 56 mL/min — ABNORMAL LOW (ref >60)
Glucose, Bld: 129 mg/dL — ABNORMAL HIGH (ref 70–99)
Potassium: 4.2 mmol/L (ref 3.5–5.1)
Sodium: 135 mmol/L (ref 135–145)
Total Bilirubin: 0.5 mg/dL (ref 0.3–1.2)
Total Protein: 6.4 g/dL — ABNORMAL LOW (ref 6.5–8.1)

## 2022-05-01 LAB — TROPONIN I (HIGH SENSITIVITY)
Troponin I (High Sensitivity): 11 ng/L (ref <18)
Troponin I (High Sensitivity): 11 ng/L (ref <18)

## 2022-05-01 LAB — BRAIN NATRIURETIC PEPTIDE: B Natriuretic Peptide: 152.7 pg/mL — ABNORMAL HIGH (ref 0.0–100.0)

## 2022-05-01 LAB — I-STAT CHEM 8, ED
BUN: 18 mg/dL (ref 8–23)
Calcium, Ion: 1.13 mmol/L — ABNORMAL LOW (ref 1.15–1.40)
Chloride: 96 mmol/L — ABNORMAL LOW (ref 98–111)
Creatinine, Ser: 1.2 mg/dL (ref 0.61–1.24)
Glucose, Bld: 127 mg/dL — ABNORMAL HIGH (ref 70–99)
HCT: 35 % — ABNORMAL LOW (ref 39.0–52.0)
Hemoglobin: 11.9 g/dL — ABNORMAL LOW (ref 13.0–17.0)
Potassium: 4.2 mmol/L (ref 3.5–5.1)
Sodium: 134 mmol/L — ABNORMAL LOW (ref 135–145)
TCO2: 30 mmol/L (ref 22–32)

## 2022-05-01 LAB — PROCALCITONIN: Procalcitonin: <0.1 ng/mL

## 2022-05-01 MED ORDER — VITAMIN D 25 MCG (1000 UNIT) PO TABS
5000.0000 [IU] | ORAL_TABLET | Freq: Every day | ORAL | Status: DC
  Administered 2022-05-02 – 2022-05-09 (×8): 5000 [IU] via ORAL
  Filled 2022-05-01 (×8): qty 5

## 2022-05-01 MED ORDER — PRIMIDONE 125 MG PO TABS: 125.0000 mg | ORAL_TABLET | ORAL | Status: DC

## 2022-05-01 MED ORDER — SODIUM CHLORIDE 0.9 % IV SOLN
1.0000 g | Freq: Once | INTRAVENOUS | Status: AC
  Administered 2022-05-01: 1 g via INTRAVENOUS
  Filled 2022-05-01: qty 10

## 2022-05-01 MED ORDER — BUDESONIDE 0.5 MG/2ML IN SUSP
2.0000 mL | Freq: Two times a day (BID) | RESPIRATORY_TRACT | Status: DC
  Administered 2022-05-01 – 2022-05-09 (×16): 0.5 mg via RESPIRATORY_TRACT
  Filled 2022-05-01 (×19): qty 2

## 2022-05-01 MED ORDER — APIXABAN 5 MG PO TABS
5.0000 mg | ORAL_TABLET | Freq: Two times a day (BID) | ORAL | Status: DC
  Administered 2022-05-02 – 2022-05-09 (×15): 5 mg via ORAL
  Filled 2022-05-01 (×15): qty 1

## 2022-05-01 MED ORDER — FUROSEMIDE 10 MG/ML IJ SOLN
40.0000 mg | Freq: Once | INTRAMUSCULAR | Status: AC
  Administered 2022-05-01: 40 mg via INTRAVENOUS
  Filled 2022-05-01: qty 4

## 2022-05-01 MED ORDER — ACETAMINOPHEN 325 MG PO TABS
650.0000 mg | ORAL_TABLET | Freq: Four times a day (QID) | ORAL | Status: DC | PRN
  Filled 2022-05-01: qty 2

## 2022-05-01 MED ORDER — PRIMIDONE 250 MG PO TABS
125.0000 mg | ORAL_TABLET | Freq: Every morning | ORAL | Status: DC
  Administered 2022-05-02: 125 mg via ORAL
  Filled 2022-05-01 (×2): qty 1

## 2022-05-01 MED ORDER — ACETAMINOPHEN 650 MG RE SUPP: 650.0000 mg | Freq: Four times a day (QID) | RECTAL | Status: DC | PRN

## 2022-05-01 MED ORDER — ATORVASTATIN CALCIUM 40 MG PO TABS
40.0000 mg | ORAL_TABLET | Freq: Every day | ORAL | Status: DC
  Administered 2022-05-02 – 2022-05-08 (×7): 40 mg via ORAL
  Filled 2022-05-01 (×7): qty 1

## 2022-05-01 MED ORDER — FOLIC ACID-VIT B6-VIT B12 2.5-25-1 MG PO TABS
1.0000 | ORAL_TABLET | Freq: Every day | ORAL | Status: DC
  Filled 2022-05-01: qty 1

## 2022-05-01 MED ORDER — IPRATROPIUM BROMIDE 0.02 % IN SOLN
0.5000 mg | Freq: Once | RESPIRATORY_TRACT | Status: AC
  Administered 2022-05-01: 0.5 mg via RESPIRATORY_TRACT
  Filled 2022-05-01: qty 2.5

## 2022-05-01 MED ORDER — ONDANSETRON HCL 4 MG/2ML IJ SOLN: 4.0000 mg | Freq: Four times a day (QID) | INTRAMUSCULAR | Status: DC | PRN

## 2022-05-01 MED ORDER — LEVOTHYROXINE SODIUM 25 MCG PO TABS
125.0000 ug | ORAL_TABLET | Freq: Every day | ORAL | Status: DC
  Administered 2022-05-03 – 2022-05-09 (×7): 125 ug via ORAL
  Filled 2022-05-01 (×7): qty 1

## 2022-05-01 MED ORDER — FUROSEMIDE 10 MG/ML IJ SOLN
8.0000 mg/h | INTRAVENOUS | Status: DC
  Administered 2022-05-01: 4 mg/h via INTRAVENOUS
  Administered 2022-05-03: 8 mg/h via INTRAVENOUS
  Filled 2022-05-01 (×2): qty 20

## 2022-05-01 MED ORDER — FUROSEMIDE 10 MG/ML IJ SOLN: 40.0000 mg | Freq: Every day | INTRAMUSCULAR | Status: DC

## 2022-05-01 MED ORDER — ONDANSETRON HCL 4 MG PO TABS: 4.0000 mg | ORAL_TABLET | Freq: Four times a day (QID) | ORAL | Status: DC | PRN

## 2022-05-01 MED ORDER — VANCOMYCIN HCL 1750 MG/350ML IV SOLN
1750.0000 mg | Freq: Once | INTRAVENOUS | Status: AC
  Administered 2022-05-02: 1750 mg via INTRAVENOUS
  Filled 2022-05-01: qty 350

## 2022-05-01 MED ORDER — LATANOPROST 0.005 % OP SOLN
1.0000 [drp] | Freq: Every day | OPHTHALMIC | Status: DC
  Administered 2022-05-02 – 2022-05-08 (×7): 1 [drp] via OPHTHALMIC
  Filled 2022-05-01: qty 2.5

## 2022-05-01 MED ORDER — DORZOLAMIDE HCL-TIMOLOL MAL 2-0.5 % OP SOLN
1.0000 [drp] | Freq: Two times a day (BID) | OPHTHALMIC | Status: DC
  Administered 2022-05-02 – 2022-05-09 (×15): 1 [drp] via OPHTHALMIC
  Filled 2022-05-01: qty 10

## 2022-05-01 MED ORDER — SENNA 8.6 MG PO TABS: 1.0000 | ORAL_TABLET | Freq: Two times a day (BID) | ORAL | Status: DC | PRN

## 2022-05-01 MED ORDER — ASPIRIN 81 MG PO TBEC
81.0000 mg | DELAYED_RELEASE_TABLET | Freq: Every day | ORAL | Status: DC
  Administered 2022-05-02 – 2022-05-08 (×7): 81 mg via ORAL
  Filled 2022-05-01 (×7): qty 1

## 2022-05-01 MED ORDER — BRIMONIDINE TARTRATE 0.15 % OP SOLN
1.0000 [drp] | Freq: Two times a day (BID) | OPHTHALMIC | Status: DC
  Administered 2022-05-02 – 2022-05-09 (×15): 1 [drp] via OPHTHALMIC
  Filled 2022-05-01: qty 5

## 2022-05-01 MED ORDER — PRIMIDONE 250 MG PO TABS
250.0000 mg | ORAL_TABLET | Freq: Every day | ORAL | Status: DC
  Administered 2022-05-02: 250 mg via ORAL
  Filled 2022-05-01 (×2): qty 1

## 2022-05-01 MED ORDER — METHYLPREDNISOLONE SODIUM SUCC 125 MG IJ SOLR
125.0000 mg | Freq: Once | INTRAMUSCULAR | Status: AC
  Administered 2022-05-01: 125 mg via INTRAVENOUS
  Filled 2022-05-01: qty 2

## 2022-05-01 MED ORDER — VANCOMYCIN HCL 1250 MG/250ML IV SOLN: 1250.0000 mg | INTRAVENOUS | Status: DC

## 2022-05-01 MED ORDER — SODIUM CHLORIDE 0.9 % IV SOLN
1.0000 g | Freq: Two times a day (BID) | INTRAVENOUS | Status: DC
  Administered 2022-05-01: 1 g via INTRAVENOUS
  Filled 2022-05-01 (×3): qty 20

## 2022-05-01 MED ORDER — ALBUTEROL SULFATE (2.5 MG/3ML) 0.083% IN NEBU
10.0000 mg/h | INHALATION_SOLUTION | Freq: Once | RESPIRATORY_TRACT | Status: AC
  Administered 2022-05-01: 10 mg/h via RESPIRATORY_TRACT
  Filled 2022-05-01: qty 3

## 2022-05-01 MED ORDER — ALBUTEROL SULFATE (2.5 MG/3ML) 0.083% IN NEBU
2.5000 mg | INHALATION_SOLUTION | RESPIRATORY_TRACT | Status: DC | PRN
  Administered 2022-05-02 – 2022-05-03 (×2): 2.5 mg via RESPIRATORY_TRACT
  Filled 2022-05-01 (×2): qty 3

## 2022-05-01 MED ORDER — IPRATROPIUM-ALBUTEROL 0.5-2.5 (3) MG/3ML IN SOLN
3.0000 mL | Freq: Three times a day (TID) | RESPIRATORY_TRACT | Status: DC
  Administered 2022-05-01 – 2022-05-02 (×2): 3 mL via RESPIRATORY_TRACT
  Filled 2022-05-01 (×3): qty 3

## 2022-05-01 NOTE — Assessment & Plan Note (Signed)
Most likely secondary to acute COPD and acute CHF exacerbation Patient was hypoxic in the field with room air pulse oximetry in the 70s ABG shows uncompensated respiratory acidosis He is currently on BiPAP Repeat ABG in 2 hours We will attempt to wean patient off BiPAP as tolerated

## 2022-05-01 NOTE — ED Notes (Signed)
Head of bed elevated.

## 2022-05-01 NOTE — Progress Notes (Signed)
Pharmacy Antibiotic Note  Billy Smith is a 88 y.o. male admitted on 05/01/2022 presenting from SNF with SOB, recent hospitalization for pna.  Pharmacy has been consulted for merrem and vancomycin dosing.  Plan: Merrem 1g IV q 12h Vancomycin 1750 mg IV x 1, then 1250 mg IV q 36h (eAUC Add MRSA PCR Monitor renal function, Cx/PCR to narrow Vancomycin levels as needed  Height: 5\' 5"  (165.1 cm) Weight: 101.6 kg (223 lb 15.8 oz) IBW/kg (Calculated) : 61.5  Temp (24hrs), Avg:97.8 F (36.6 C), Min:97.8 F (36.6 C), Max:97.8 F (36.6 C)  Recent Labs  Lab 05/01/22 1422 05/01/22 1446  WBC 7.0  --   CREATININE 1.24 1.20    Estimated Creatinine Clearance: 47.5 mL/min (by C-G formula based on SCr of 1.2 mg/dL).    No Known Allergies  07/01/22, PharmD Clinical Pharmacist ED Pharmacist Phone # [phone] 05/01/2022 9:07 PM

## 2022-05-01 NOTE — Assessment & Plan Note (Addendum)
Patient with no clinical signs of acute exacerbation  Plan to continue bronchodilator therapy

## 2022-05-01 NOTE — ED Notes (Signed)
Dr Audrie Lia at bedside and is aware patient not responsive on Bi-pap.

## 2022-05-01 NOTE — Assessment & Plan Note (Addendum)
Clinically improved with supportive medical care.    Essential tremors, continue with primidone and propranolol.

## 2022-05-01 NOTE — Progress Notes (Signed)
Pt transported from ED to 3E06 on BiPAP without any complications.

## 2022-05-01 NOTE — Progress Notes (Signed)
RT assisted with transport of this pt from ED to CT and back. Pt tolerated well with SVS and no complications. RT will continue to monitor pt.

## 2022-05-01 NOTE — Progress Notes (Signed)
Critical ABG results were given to ED MD.

## 2022-05-01 NOTE — Consult Note (Signed)
Cardiology Consultation   Patient ID: KERMITT HARJO MRN: [id number]; DOB: 1932/11/16  Admit date: 05/01/2022 Date of Consult: 05/01/2022  PCP:  Paulina Fusi, MD   [facility] Providers Cardiologist:  Verne Carrow, MD        Patient Profile:   Billy Smith is a 88 y.o. male with a hx of atrial flutter, chronic diastolic dysfunction CHF, COPD, obstructive sleep apnea, coronary artery disease status post 4V CABG (LIMA to LAD, SVG to intermediate, SVG to circumflex marginal, SVG to PDA). who is being seen 05/01/2022 for the evaluation of acute exacerbation of heart failure at the request of Dr. Joylene Igo.  History of Present Illness:   Billy Smith  was brought in to the ED to be evaluated for shortness of breath, wheezing and hypoxia. The patient was on bipap during my visit therefore much of the information of the was transcribed from chart review.   The patient was recently hospitalized at [facility] where he was treated for CHF. PNA and rapid atrial flutter. Upon discharge from [facility] he was transitioned to skilled nursing facility for subacute rehab.  Over the last several days it was reported that the patient has sounded congested and shortness of breath worsened.   Today his symptoms worsened and was noted to be in respiratory distress therefore EMS was called and at that time his pulse oximetry note 02 level in the 70s and was placed on CPAP with improvement in his oxygenation to 96%.   On presentation to the ED he was placed on bipap.Chest x-ray showed cardiomegaly with pulmonary venous congestion and possible infiltrate in the left lower lobe.   He was admitted to the hospitalist service for acute hypoxic respiratory failure  and acute exacerbation of diastolic heart failure.    Past Medical History:  Diagnosis Date   Abnormal involuntary movements(781.0)    CAD (coronary artery disease)    with 4V CABG 2004   Chronic airway obstruction,  not elsewhere classified    Nephrolithiasis    Obstructive sleep apnea (adult) (pediatric)    Venous insufficiency     Past Surgical History:  Procedure Laterality Date   CORONARY ARTERY BYPASS GRAFT  2004      Inpatient Medications: Scheduled Meds:  apixaban  5 mg Oral BID   [START ON 05/02/2022] aspirin EC  81 mg Oral Daily   atorvastatin  40 mg Oral QHS   brimonidine  1 drop Both Eyes BID   budesonide  2 mL Inhalation BID   [START ON 05/02/2022] cholecalciferol  5,000 Units Oral Daily   dorzolamide-timolol  1 drop Right Eye BID   Folic Acid-Vit B6-Vit B12  1 tablet Oral Daily   [START ON 05/02/2022] furosemide  40 mg Intravenous Daily   ipratropium-albuterol  3 mL Inhalation TID   latanoprost  1 drop Both Eyes QHS   [START ON 05/02/2022] levothyroxine  125 mcg Oral Daily   primidone  250 mg Oral QHS   And   [START ON 05/02/2022] primidone  125 mg Oral q morning   Continuous Infusions:  PRN Meds: acetaminophen **OR** acetaminophen, albuterol, ondansetron **OR** ondansetron (ZOFRAN) IV, senna  Allergies:   No Known Allergies  Social History:   Social History   Socioeconomic History   Marital status: Married    Spouse name: Not on file   Number of children: 1   Years of education: Not on file   Highest education level: Not on file  Occupational History  Occupation: Retired-welder  Tobacco Use   Smoking status: Former    Packs/day: 0.50    Years: 20.00    Total pack years: 10.00    Types: Cigarettes    Quit date: 08/21/2002    Years since quitting: 19.7   Smokeless tobacco: Never   Tobacco comments:    pt stopped smoking after his bypass  Substance and Sexual Activity   Alcohol use: No   Drug use: No   Sexual activity: Not on file  Other Topics Concern   Not on file  Social History Narrative   Not on file   Social Determinants of Health   Financial Resource Strain: Not on file  Food Insecurity: Not on file  Transportation Needs: Not on file  Physical  Activity: Not on file  Stress: Not on file  Social Connections: Not on file  Intimate Partner Violence: Not on file    Family History:    Family History  Problem Relation Age of Onset   Hypertension Mother    CAD Brother    Valvular heart disease Brother    Hypertension Brother    Heart attack Neg Hx    Stroke Neg Hx      ROS:  Please see the history of present illness.   All other ROS reviewed and negative.     Physical Exam/Data:   Vitals:   05/01/22 1600 05/01/22 1615 05/01/22 1645 05/01/22 1715  BP: (!) 147/114 (!) 129/93 (!) 157/141 138/83  Pulse: 60 60 (!) 57 62  Resp: 15 18 16 17   Temp:      TempSrc:      SpO2: 100% 100% 100% 100%  Weight:      Height:       No intake or output data in the 24 hours ending 05/01/22 1810    05/01/2022    2:26 PM 03/09/2020    1:49 PM 02/27/2020   10:22 AM  Last 3 Weights  Weight (lbs) 223 lb 15.8 oz 224 lb 222 lb  Weight (kg) 101.6 kg 101.606 kg 100.699 kg     Body mass index is 37.27 kg/m.   General:  On bipap HEENT: normal Neck: no JVD Vascular: No carotid bruits; Distal pulses 2+ bilaterally Cardiac:  normal S1, S2; RRR; no murmur  Lungs:  clear to auscultation bilaterally, no wheezing, rhonchi or rales  Abd: soft, nontender, no hepatomegaly  Ext: anasarca  Musculoskeletal:  No deformities, BUE and BLE strength normal and equal Skin: warm and dry  Neuro:  CNs 2-12 intact, no focal abnormalities noted Psych:  Normal affect   EKG:  The EKG was personally reviewed and demonstrates:  sinus rhythm with HR 65 bp, with underlying right bundle branch block.   Telemetry:  Telemetry was personally reviewed and demonstrates:  sinus rhythm  Relevant CV Studies:  TTE 03/03/2020 IMPRESSIONS   1. Left ventricular ejection fraction, by estimation, is 50 to 55%. The left ventricle has low normal function. The left ventricle has no regional wall motion abnormalities. There is severe concentric left ventricular hypertrophy. Left  ventricular  diastolic parameters are indeterminate.   2. Right ventricular systolic function is normal. The right ventricular  size is normal.   3. The mitral valve is normal in structure. No evidence of mitral valve  regurgitation. No evidence of mitral stenosis.   4. The aortic valve has an indeterminant number of cusps can not rule out  a bicuspid aortic valve. Aortic valve regurgitation is not visualized. No  aortic  stenosis is present.   5. Mild aortic root calcification.   6. The inferior vena cava is normal in size with greater than 50%  respiratory variability, suggesting right atrial pressure of 3 mmHg.   FINDINGS   Left Ventricle: Left ventricular ejection fraction, by estimation, is 50  to 55%. The left ventricle has low normal function. The left ventricle has  no regional wall motion abnormalities. The left ventricular internal  cavity size was normal in size.  There is severe concentric left ventricular hypertrophy. Left ventricular  diastolic parameters are indeterminate.   Right Ventricle: The right ventricular size is normal. No increase in  right ventricular wall thickness. Right ventricular systolic function is  normal.   Left Atrium: Left atrial size was normal in size.   Right Atrium: Right atrial size was normal in size.   Pericardium: There is no evidence of pericardial effusion.   Mitral Valve: The mitral valve is normal in structure. Normal mobility of  the mitral valve leaflets. No evidence of mitral valve regurgitation. No  evidence of mitral valve stenosis.   Tricuspid Valve: The tricuspid valve is normal in structure. Tricuspid  valve regurgitation is not demonstrated. No evidence of tricuspid  stenosis.   Aortic Valve: The aortic valve has an indeterminant number of cusps. .  There is mild thickening and mild calcification of the aortic valve.  Aortic valve regurgitation is not visualized. No aortic stenosis is  present. There is mild thickening  of the  aortic valve. There is mild calcification of the aortic valve.   Pulmonic Valve: The pulmonic valve was normal in structure. Pulmonic valve  regurgitation is not visualized. No evidence of pulmonic stenosis.   Aorta: Mild aortic root calcification. The aortic root is normal in size  and structure.   Venous: The inferior vena cava is normal in size with greater than 50%  respiratory variability, suggesting right atrial pressure of 3 mmHg.   IAS/Shunts: No atrial level shunt detected by color flow Doppler.   Laboratory Data:  High Sensitivity Troponin:   Recent Labs  Lab 05/01/22 1654  TROPONINIHS 11     Chemistry Recent Labs  Lab 05/01/22 1422 05/01/22 1446  NA 135 134*  134*  K 4.2 4.2  4.2  CL 99 96*  CO2 28  --   GLUCOSE 129* 127*  BUN 18 18  CREATININE 1.24 1.20  CALCIUM 8.5*  --   GFRNONAA 56*  --   ANIONGAP 8  --     Recent Labs  Lab 05/01/22 1422  PROT 6.4*  ALBUMIN 3.2*  AST 27  ALT 20  ALKPHOS 63  BILITOT 0.5   Lipids No results for input(s): "CHOL", "TRIG", "HDL", "LABVLDL", "LDLCALC", "CHOLHDL" in the last 168 hours.  Hematology Recent Labs  Lab 05/01/22 1422 05/01/22 1446  WBC 7.0  --   RBC 3.34*  --   HGB 11.8* 11.9*  11.6*  HCT 35.2* 35.0*  34.0*  MCV 105.4*  --   MCH 35.3*  --   MCHC 33.5  --   RDW 14.9  --   PLT 156  --    Thyroid No results for input(s): "TSH", "FREET4" in the last 168 hours.  BNP Recent Labs  Lab 05/01/22 1423  BNP 152.7*    DDimer No results for input(s): "DDIMER" in the last 168 hours.   Radiology/Studies:  CT HEAD WO CONTRAST (5MM)  Result Date: 05/01/2022 CLINICAL DATA:  Mental status changes of unknown cause EXAM: CT HEAD  WITHOUT CONTRAST TECHNIQUE: Contiguous axial images were obtained from the base of the skull through the vertex without intravenous contrast. RADIATION DOSE REDUCTION: This exam was performed according to the departmental dose-optimization program which includes automated  exposure control, adjustment of the mA and/or kV according to patient size and/or use of iterative reconstruction technique. COMPARISON:  09/04/2012 FINDINGS: Brain: Generalized atrophy, cerebral and cerebellar. No midline shift or mass effect. Small vessel chronic ischemic changes of deep cerebral white matter. No intracranial hemorrhage, mass lesion, or evidence of acute infarction. No extra-axial fluid collections. Vascular: Atherosclerotic calcification of internal carotid and vertebral arteries at skull base Skull: Intact Sinuses/Orbits: Clear Other: N/A IMPRESSION: Atrophy with small vessel chronic ischemic changes of deep cerebral white matter. No acute intracranial abnormalities. Electronically Signed   By: Ulyses Southward M.D.   On: 05/01/2022 18:04   DG Chest Port 1 View  Result Date: 05/01/2022 CLINICAL DATA:  Shortness of breath EXAM: PORTABLE CHEST 1 VIEW COMPARISON:  Radiograph 03/16/2022 FINDINGS: Enlarged cardiomediastinal silhouette with prior median sternotomy and CABG. Pulmonary vascular congestion. Elevated left hemidiaphragm. There are left lower lung opacities. Suspected trace left pleural effusion. No visible pneumothorax. Bilateral shoulder degenerative changes. No acute osseous abnormality. IMPRESSION: Cardiomegaly with pulmonary vascular congestion. Left lower lung opacities, could reflect atelectasis, alveolar edema,, or potentially infection. Suspected trace left pleural effusion. Electronically Signed   By: Caprice Renshaw M.D.   On: 05/01/2022 14:41     Assessment and Plan:   Acute exacerbation of diastolic heart failure - clinically he  appears to be significantly volume overloaded. He was started on lasix IV. He will benefit from transitioning to Lasix gtt for at least the next 24 hrs and reassess. Please get a repeat echo. Strict input and output with daily weigh. Please monitor electrolytes closely Acute respiratory failure with hypoxia - is in heart failure exacerbation which I  suspect is the driver of his respiratory failure although there is also concern for pneumonia.  Hypertension - have improve will continue to monitor. CAD - trop neg. Unable to elicit symptoms from the patient. ECG does not show any ischemic changes.  Atrial Flutter - in sinus rhythm. Continue Eliquis   Risk Assessment/Risk Scores:       New York Heart Association (NYHA) Functional Class NYHA Class IV  CHA2DS2-VASc Score =     This indicates a  % annual risk of stroke. The patient's score is based upon:      For questions or updates, please contact [facility] Please consult [email or web address] for contact info under   Signed, Thomasene Ripple, DO  05/01/2022 6:10 PM

## 2022-05-01 NOTE — Assessment & Plan Note (Addendum)
Paroxysmal Patient has been maintained on sinus rhythm, plan to continue anticoagulation with apixaban.   Discussed drug interaction of apixaban and primidone with daughter 9/13 and 9/14 she is unwilling to taper his primidone on account of severe debilitating tremors for which he has been on primidone for 20 years, refuses warfarin due to overall debility and difficulties with managing INR, need for frequent lab draws etc, she wishes to continue Eliquis and accept its suboptimal efficacy while on primidone. Per the patient's daughter at bedside he takes 1 tablet in the morning and half a tablet at night

## 2022-05-01 NOTE — H&P (Addendum)
History and Physical    Patient: Billy Smith:[id number] DOB: September 29, 1932 DOA: 05/01/2022 DOS: the patient was seen and examined on 05/01/2022 PCP: Paulina Fusi, MD  Patient coming from: SNF  Chief Complaint:  Chief Complaint  Patient presents with   Shortness of Breath   Most of the history was obtained from patient's daughter at the bedside. HPI: Billy Smith is a 88 y.o. male with medical history significant for atrial flutter, chronic diastolic dysfunction CHF, COPD, obstructive sleep apnea, coronary artery disease status post CABG who was brought to the ER from the skilled nursing facility where he resides for evaluation of shortness of breath, wheezing and hypoxia with room air pulse oximetry in the 70s. According to his daughter he was recently hospitalized at [facility] for CHF, pneumonia and rapid atrial flutter.  He was discharged to a skilled nursing facility for subacute rehab.  She notes that over the last several days he has sounded congested and she talked to the staff at the nursing home who were going to order some blood work for further evaluation. On the day of admission patient was found slumped in his chair and noted to be in respiratory distress.  EMS was called and he was noted to have room air pulse oximetry in the 70s and was placed on CPAP with improvement in his oxygenation to 96%.  He received 2 DuoNebs in route to the hospital. He is currently on a BiPAP and is lethargic but opens eyes to painful stimuli. He is unable to participate in this history and physical. Chest x-ray showed cardiomegaly with pulmonary venous congestion and possible infiltrate in the left lower lobe. He will be admitted to the hospital for further evaluation    Review of Systems: Unable to do review of systems due to patient's condition Past Medical History:  Diagnosis Date   Abnormal involuntary movements(781.0)    CAD (coronary artery disease)    with 4V CABG 2004   Chronic  airway obstruction, not elsewhere classified    Nephrolithiasis    Obstructive sleep apnea (adult) (pediatric)    Venous insufficiency    Past Surgical History:  Procedure Laterality Date   CORONARY ARTERY BYPASS GRAFT  2004   Social History:  reports that he quit smoking about 19 years ago. His smoking use included cigarettes. He has a 10.00 pack-year smoking history. He has never used smokeless tobacco. He reports that he does not drink alcohol and does not use drugs.  No Known Allergies  Family History  Problem Relation Age of Onset   Hypertension Mother    CAD Brother    Valvular heart disease Brother    Hypertension Brother    Heart attack Neg Hx    Stroke Neg Hx     Prior to Admission medications   Medication Sig Start Date End Date Taking? Authorizing Provider  albuterol (VENTOLIN HFA) 108 (90 Base) MCG/ACT inhaler Inhale 2 puffs into the lungs every 6 (six) hours as needed for wheezing or shortness of breath.   Yes [provider]  apixaban (ELIQUIS) 5 MG TABS tablet Take 5 mg by mouth 2 (two) times daily. 04/17/22  Yes [provider]  aspirin EC 81 MG tablet Take 1 tablet (81 mg total) by mouth daily. 02/12/17  Yes Kathleene Hazel, MD  atorvastatin (LIPITOR) 40 MG tablet Take 40 mg by mouth at bedtime. 02/03/17  Yes [provider]  bimatoprost (LUMIGAN) 0.01 % SOLN Place 1 drop into both eyes  at bedtime. 01/27/22  Yes [provider]  brimonidine (ALPHAGAN) 0.15 % ophthalmic solution Place 1 drop into both eyes 2 (two) times daily. 02/19/20  Yes [provider]  budesonide (PULMICORT) 0.5 MG/2ML nebulizer solution Inhale 2 mLs into the lungs 2 (two) times daily. 04/17/22  Yes [provider]  calcium-vitamin D (OSCAL WITH D) 500-5 MG-MCG tablet Take 1 tablet by mouth 2 (two) times daily.   Yes [provider]  Cholecalciferol 125 MCG (5000 UT) TABS Take 5,000 Units by mouth daily. 04/18/22  Yes [provider]  dorzolamide-timolol (COSOPT) 22.3-6.8 MG/ML ophthalmic solution Place 1 drop into the right eye 2 (two) times daily. 02/16/20  Yes [provider]  FOLTABS 800 800-10-115 MCG-MG-MCG TABS Take 1 tablet by mouth daily. 11/27/16  Yes [provider]  furosemide (LASIX) 40 MG tablet Take 40 mg by mouth 2 (two) times daily. 04/28/22  Yes [provider]  guaiFENesin (ROBITUSSIN) 100 MG/5ML liquid Take 10 mLs by mouth every 6 (six) hours as needed for cough.   Yes [provider]  ipratropium-albuterol (DUONEB) 0.5-2.5 (3) MG/3ML SOLN Inhale 3 mLs into the lungs 3 (three) times daily. 04/17/22  Yes [provider]  levothyroxine (SYNTHROID, LEVOTHROID) 125 MCG tablet Take 125 mcg by mouth daily.   Yes [provider]  Primidone 125 MG TABS Take 125 mg by mouth See admin instructions. Two orders on MAR. Give 1 tablet by mouth at bedtime (2100) for anticonvulsant. Give 2 tablet by mouth one time a day (0900) for tremors. 04/17/22  Yes [provider]  propranolol (INDERAL) 20 MG tablet Take 20 mg by mouth 3 (three) times daily.   Yes [provider]  senna (SENOKOT) 8.6 MG tablet Take 1 tablet by mouth every 12 (twelve) hours as needed for constipation.   Yes [provider]  torsemide (DEMADEX) 20 MG tablet Take 2 tablets by mouth twice daily Patient not taking: Reported on 05/01/2022 05/04/20   Kathleene Hazel, MD    Physical Exam: Vitals:   05/01/22 1600 05/01/22 1615 05/01/22 1645 05/01/22 1715  BP: (!) 147/114 (!) 129/93 (!) 157/141 138/83  Pulse: 60 60 (!) 57 62  Resp: 15 18 16 17   Temp:      TempSrc:      SpO2: 100% 100% 100% 100%  Weight:      Height:       Physical Exam Vitals and nursing note reviewed.  Constitutional:      Comments: Lethargic on BiPAP.  HENT:     Head: Normocephalic and atraumatic.     Mouth/Throat:     Mouth: Mucous membranes are moist.  Eyes:     Pupils: Pupils are  equal, round, and reactive to light.  Cardiovascular:     Rate and Rhythm: Bradycardia present.  Pulmonary:     Breath sounds: Examination of the right-upper field reveals wheezing. Examination of the left-upper field reveals wheezing. Examination of the right-lower field reveals rales. Examination of the left-lower field reveals rales. Wheezing and rales present.  Abdominal:     General: Bowel sounds are normal.     Palpations: Abdomen is soft.  Musculoskeletal:     Cervical back: Normal range of motion and neck supple.     Right lower leg: Edema present.     Left lower leg: Edema present.  Skin:    General: Skin is warm and dry.  Neurological:     Comments: Unable to assess  Psychiatric:  Comments: Unable to assess     Data Reviewed: Relevant notes from primary care and specialist visits, past discharge summaries as available in EHR, including Care Everywhere. Prior diagnostic testing as pertinent to current admission diagnoses Updated medications and problem lists for reconciliation ED course, including vitals, labs, imaging, treatment and response to treatment Triage notes, nursing and pharmacy notes and ED provider's notes Notable results as noted in HPI Labs reviewed.  BNP 152, sodium 135, potassium 4.2, chloride 99, bicarb 28, glucose 129, BUN 18, creatinine 1.24, calcium 8.5, total protein 6.4, albumin 3.2, AST 27, ALT 20, alkaline phosphatase 63, total bilirubin 0.5, white count 7.0, hemoglobin 11.8, hematocrit 35.2, platelet count 156 7.26/67.5/41/66 Twelve-lead EKG reviewed by me shows sinus rhythm, right bundle branch block with nonspecific T wave changes There are no new results to review at this time.  Assessment and Plan: * Acute hypercapnic respiratory failure (HCC) Most likely secondary to acute COPD and acute CHF exacerbation Patient was hypoxic in the field with room air pulse oximetry in the 70s ABG shows uncompensated respiratory acidosis He is currently  on BiPAP Repeat ABG in 2 hours We will attempt to wean patient off BiPAP as tolerated  Acute metabolic encephalopathy Most likely secondary to hypercarbia At baseline patient is usually awake, alert and conversant Expect improvement in patient's mental status with resolution of hypercarbia We will obtain an MRI of the brain without contrast if mental status does not improve with resolution of hypercarbia  Acute on chronic diastolic CHF (congestive heart failure) (HCC) Patient presents for evaluation of shortness of breath, wheezing and was hypoxic in the field. Chest x-ray shows cardiomegaly with pulmonary vascular congestion Recently hospitalized at [facility], 2D echo results unavailable at this time Last known LVEF was 50 to 55% from 2021 Place patient on Lasix drip Hold beta-blockers due to bradycardia Optimize blood pressure control Cardiology consult  COPD with acute exacerbation (HCC) Continue scheduled and as needed bronchodilator therapy Continue inhaled steroids Continue oxygen supplementation to maintain pulse oximetry greater than 92%  Atrial flutter (HCC) Paroxysmal Continue apixaban as primary prophylaxis for an acute stroke Beta-blockers on hold due to bradycardia      Advance Care Planning:   Code Status: DNR   Consults: Cardiology  Family Communication: Greater than 50% of time was spent discussing patient's condition and plan of care with his daughter and her husband at the bedside.  All questions and concerns have been addressed.  They verbalized understanding and agree with the plan.  Severity of Illness: The appropriate patient status for this patient is INPATIENT. Inpatient status is judged to be reasonable and necessary in order to provide the required intensity of service to ensure the patient's safety. The patient's presenting symptoms, physical exam findings, and initial radiographic and laboratory data in the context of their chronic  comorbidities is felt to place them at high risk for further clinical deterioration. Furthermore, it is not anticipated that the patient will be medically stable for discharge from the hospital within 2 midnights of admission.   * I certify that at the point of admission it is my clinical judgment that the patient will require inpatient hospital care spanning beyond 2 midnights from the point of admission due to high intensity of service, high risk for further deterioration and high frequency of surveillance required.*  Author: Lucile Shutters, MD 05/01/2022 6:45 PM  For on call review [email or web address].

## 2022-05-01 NOTE — Assessment & Plan Note (Deleted)
Patient presents for evaluation of shortness of breath, wheezing and was hypoxic in the field. Chest x-ray shows cardiomegaly with pulmonary vascular congestion Recently hospitalized at [facility], 2D echo results unavailable at this time Last known LVEF was 50 to 55% from 2021 Place patient on Lasix drip Hold beta-blockers due to bradycardia Optimize blood pressure control Cardiology consult

## 2022-05-01 NOTE — ED Triage Notes (Signed)
Pt BIB [facility] from [facility] due to [facility].  Breathing effort was labored and saturation around 70's.  Wheezing all fields.  Pt normally does have edema.  SpO2 96% on C-Pap 15L.  DNR form at bedside.  1 albuterol given; 2 duoneb given.  VS BP 140/98.

## 2022-05-01 NOTE — ED Notes (Signed)
Attempts to grip, but is minimally weak,

## 2022-05-01 NOTE — ED Provider Notes (Addendum)
[facility] EMERGENCY DEPARTMENT Provider Note   CSN: [id number] Arrival date & time: 05/01/22  1417     History  Chief Complaint  Patient presents with   Shortness of Breath    Billy Smith is a 88 y.o. male.  HPI   88 year old male with history of nephrolithiasis, COPD, OSA, CAD, status post CABG in 2004 presents to the emergency department with respiratory distress.  Patient comes nursing home due to shortness of breath and respiratory distress.  Patient was noted this morning and creased labored breathing O2 saturations in the 70s.  EMS noted wheezing in all lung fields.  Patient was unable to an additional history because he was somnolent on arrival to the emergency department.  He arrived on CPAP.  Patient reportedly does have bilateral pulmonary edema at baseline according to facility staff and EMS.  He arrived with DNR paperwork.  Per EMS, 1 albuterol nebulizer and symptoms were administered.  Home Medications Prior to Admission medications   Medication Sig Start Date End Date Taking? Authorizing Provider  albuterol (PROVENTIL) (2.5 MG/3ML) 0.083% nebulizer solution Take 2.5 mg by nebulization as needed. 08/20/17   [provider]  ascorbic acid (VITAMIN C) 500 MG tablet Take 500 mg by mouth daily.    [provider]  aspirin EC 81 MG tablet Take 1 tablet (81 mg total) by mouth daily. 02/12/17   Kathleene Hazel, MD  atorvastatin (LIPITOR) 40 MG tablet Take 40 mg by mouth daily. 02/03/17   [provider]  bimatoprost (LUMIGAN) 0.03 % ophthalmic solution Place 1 drop into both eyes at bedtime.     [provider]  brimonidine (ALPHAGAN) 0.15 % ophthalmic solution Place 1 drop into the right eye at bedtime. 02/19/20   [provider]  Cholecalciferol (VITAMIN D) 50 MCG (2000 UT) CAPS Take 1 capsule by mouth daily.    [provider]  dorzolamide-timolol (COSOPT) 22.3-6.8 MG/ML ophthalmic solution Place 1  drop into the right eye daily. 02/19/20   [provider]  FOLTABS 800 800-10-115 MCG-MG-MCG TABS Take 1 tablet by mouth daily. 11/27/16   [provider]  levothyroxine (SYNTHROID, LEVOTHROID) 125 MCG tablet Take 125 mcg by mouth daily.    [provider]  Multiple Vitamins-Minerals (CENTRUM SILVER 50+MEN PO) Take 1 tablet by mouth daily.    [provider]  primidone (MYSOLINE) 250 MG tablet Take 125 mg by mouth 3 (three) times daily. Take 1/2 tab three times a day     [provider]  propranolol (INDERAL) 20 MG tablet Take 20 mg by mouth 3 (three) times daily.    [provider]  torsemide (DEMADEX) 20 MG tablet Take 2 tablets by mouth twice daily 05/04/20   Kathleene Hazel, MD      Allergies    Patient has no known allergies.    Review of Systems   Review of Systems  Unable to perform ROS: Mental status change    Physical Exam Updated Vital Signs BP 124/67   Pulse 60   Temp 97.8 F (36.6 C) (Oral)   Resp 18   Ht 5\' 5"  (1.651 m)   Wt 101.6 kg   SpO2 100%   BMI 37.27 kg/m  Physical Exam Vitals and nursing note reviewed.  Constitutional:      General: He is not in acute distress.    Appearance: He is well-developed.  HENT:     Head: Normocephalic and atraumatic.  Eyes:  Conjunctiva/sclera: Conjunctivae normal.  Cardiovascular:     Rate and Rhythm: Normal rate and regular rhythm.     Heart sounds: No murmur heard. Pulmonary:     Effort: Tachypnea and respiratory distress present.     Breath sounds: Examination of the right-upper field reveals wheezing. Examination of the left-upper field reveals wheezing. Examination of the right-middle field reveals wheezing. Examination of the left-middle field reveals wheezing. Examination of the right-lower field reveals wheezing and rales. Examination of the left-lower field reveals wheezing and rales. Wheezing and rales present.  Abdominal:     Palpations: Abdomen is soft.      Tenderness: There is no abdominal tenderness.  Musculoskeletal:        General: No swelling.     Cervical back: Neck supple.     Right lower leg: Edema present.     Left lower leg: Edema present.  Skin:    General: Skin is warm and dry.     Capillary Refill: Capillary refill takes less than 2 seconds.  Neurological:     Mental Status: He is alert.  Psychiatric:        Mood and Affect: Mood normal.     ED Results / Procedures / Treatments   Labs (all labs ordered are listed, but only abnormal results are displayed) Labs Reviewed  CBC WITH DIFFERENTIAL/PLATELET - Abnormal; Notable for the following components:      Result Value   RBC 3.34 (*)    Hemoglobin 11.8 (*)    HCT 35.2 (*)    MCV 105.4 (*)    MCH 35.3 (*)    All other components within normal limits  I-STAT CHEM 8, ED - Abnormal; Notable for the following components:   Sodium 134 (*)    Chloride 96 (*)    Glucose, Bld 127 (*)    Calcium, Ion 1.13 (*)    Hemoglobin 11.9 (*)    HCT 35.0 (*)    All other components within normal limits  I-STAT VENOUS BLOOD GAS, ED - Abnormal; Notable for the following components:   pCO2, Ven 67.5 (*)    Bicarbonate 30.3 (*)    Sodium 134 (*)    Calcium, Ion 1.14 (*)    HCT 34.0 (*)    Hemoglobin 11.6 (*)    All other components within normal limits  RESP PANEL BY RT-PCR (FLU A&B, COVID) [id number]  COMPREHENSIVE METABOLIC PANEL  BRAIN NATRIURETIC PEPTIDE    EKG EKG Interpretation  Date/Time:  Monday May 01 2022 14:35:22 EDT Ventricular Rate:  65 PR Interval:  206 QRS Duration: 139 QT Interval:  457 QTC Calculation: 476 R Axis:   141 Text Interpretation: Sinus rhythm Right bundle branch block Nonspecific T abnormalities, lateral leads Confirmed by Ernie Avena ([id number]) on 05/01/2022 2:46:21 PM  Radiology DG Chest Port 1 View  Result Date: 05/01/2022 CLINICAL DATA:  Shortness of breath EXAM: PORTABLE CHEST 1 VIEW COMPARISON:  Radiograph 03/16/2022 FINDINGS:  Enlarged cardiomediastinal silhouette with prior median sternotomy and CABG. Pulmonary vascular congestion. Elevated left hemidiaphragm. There are left lower lung opacities. Suspected trace left pleural effusion. No visible pneumothorax. Bilateral shoulder degenerative changes. No acute osseous abnormality. IMPRESSION: Cardiomegaly with pulmonary vascular congestion. Left lower lung opacities, could reflect atelectasis, alveolar edema,, or potentially infection. Suspected trace left pleural effusion. Electronically Signed   By: Caprice Renshaw M.D.   On: 05/01/2022 14:41    Procedures .Critical Care  Performed by: Ernie Avena, MD Authorized by: Ernie Avena, MD   Critical care  provider statement:    Critical care time (minutes):  30   Critical care was necessary to treat or prevent imminent or life-threatening deterioration of the following conditions:  Respiratory failure   Critical care was time spent personally by me on the following activities:  Development of treatment plan with patient or surrogate, discussions with consultants, evaluation of patient's response to treatment, examination of patient, ordering and review of laboratory studies, ordering and review of radiographic studies, ordering and performing treatments and interventions, pulse oximetry, re-evaluation of patient's condition and review of old charts     Medications Ordered in ED Medications  cefTRIAXone (ROCEPHIN) 1 g in sodium chloride 0.9 % 100 mL IVPB (has no administration in time range)  methylPREDNISolone sodium succinate (SOLU-MEDROL) 125 mg/2 mL injection 125 mg (125 mg Intravenous Given 05/01/22 1444)  furosemide (LASIX) injection 40 mg (40 mg Intravenous Given 05/01/22 1445)  albuterol (PROVENTIL) (2.5 MG/3ML) 0.083% nebulizer solution (10 mg/hr Nebulization Given 05/01/22 1533)  ipratropium (ATROVENT) nebulizer solution 0.5 mg (0.5 mg Nebulization Given 05/01/22 1533)    ED Course/ Medical Decision Making/  A&P Clinical Course as of 05/01/22 1534  Mon May 01, 2022  1528 DG Chest [place] 1 View [JL]    Clinical Course User Index [JL] Ernie Avena, MD                           Medical Decision Making Amount and/or Complexity of Data Reviewed Labs: ordered. Radiology: ordered. Decision-making details documented in ED Course.  Risk Prescription drug management.    88 year old male with history of nephrolithiasis, COPD, OSA, CAD, status post CABG in 2004 presents to the emergency department with respiratory distress.  Patient comes nursing home due to shortness of breath and respiratory distress.  Patient was noted this morning and creased labored breathing O2 saturations in the 70s.  EMS noted wheezing in all lung fields.  Patient was unable to an additional history because he was somnolent on arrival to the emergency department.  He arrived on CPAP.  Patient reportedly does have bilateral pulmonary edema at baseline according to facility staff and EMS.  He arrived with DNR paperwork.  Per EMS, 1 albuterol nebulizer and symptoms were administered.  On arrival, the patient was in respiratory distress with rales present bibasilarly and wheezing in all lung fields. HDS on arrival. Satting well on BiPAP.   Ddx: CHF exacerbation, Viral illness, COVID 19, COPD exacerbation, ACS, PE.   Pt was placed on BiPAP on arrival. EKG revealed sinus rhythm, rate 65, no STEMI. CXR ordered, labs ordered to include a VBG, CBC, CMP, troponins, BNP.  Laboratory evaluation and x-ray pending at time of signout.  Signout given to Dr. Posey Rea at [phone].  Plan for likely admission following work-up.  Final Clinical Impression(s) / ED Diagnoses Final diagnoses:  Respiratory distress  Acute respiratory failure with hypoxia and hypercapnia [facility])    Rx / DC Orders ED Discharge Orders     None         Ernie Avena, MD 05/01/22 1534    Ernie Avena, MD 05/01/22 1534

## 2022-05-02 ENCOUNTER — Inpatient Hospital Stay (HOSPITAL_COMMUNITY): Payer: PPO

## 2022-05-02 DIAGNOSIS — I5033 Acute on chronic diastolic (congestive) heart failure: Secondary | ICD-10-CM

## 2022-05-02 DIAGNOSIS — E669 Obesity, unspecified: Secondary | ICD-10-CM

## 2022-05-02 DIAGNOSIS — G9341 Metabolic encephalopathy: Secondary | ICD-10-CM

## 2022-05-02 DIAGNOSIS — E66812 Obesity, class 2: Secondary | ICD-10-CM | POA: Diagnosis present

## 2022-05-02 DIAGNOSIS — J9602 Acute respiratory failure with hypercapnia: Secondary | ICD-10-CM | POA: Diagnosis not present

## 2022-05-02 DIAGNOSIS — L8990 Pressure ulcer of unspecified site, unspecified stage: Secondary | ICD-10-CM | POA: Diagnosis present

## 2022-05-02 DIAGNOSIS — I4892 Unspecified atrial flutter: Secondary | ICD-10-CM | POA: Diagnosis not present

## 2022-05-02 LAB — BLOOD GAS, ARTERIAL
Acid-Base Excess: 4.3 mmol/L — ABNORMAL HIGH (ref 0.0–2.0)
Bicarbonate: 31.8 mmol/L — ABNORMAL HIGH (ref 20.0–28.0)
Delivery systems: POSITIVE
Drawn by: 270271
Expiratory PAP: 6 cmH2O
FIO2: 40 %
Inspiratory PAP: 18 cmH2O
Mode: POSITIVE
O2 Saturation: 97.1 %
Patient temperature: 37.1
pCO2 arterial: 59 mmHg — ABNORMAL HIGH (ref 32–48)
pH, Arterial: 7.34 — ABNORMAL LOW (ref 7.35–7.45)
pO2, Arterial: 76 mmHg — ABNORMAL LOW (ref 83–108)

## 2022-05-02 LAB — BASIC METABOLIC PANEL
Anion gap: 13 (ref 5–15)
BUN: 18 mg/dL (ref 8–23)
CO2: 27 mmol/L (ref 22–32)
Calcium: 8.6 mg/dL — ABNORMAL LOW (ref 8.9–10.3)
Chloride: 97 mmol/L — ABNORMAL LOW (ref 98–111)
Creatinine, Ser: 1.05 mg/dL (ref 0.61–1.24)
GFR, Estimated: >60 mL/min (ref >60)
Glucose, Bld: 126 mg/dL — ABNORMAL HIGH (ref 70–99)
Potassium: 5 mmol/L (ref 3.5–5.1)
Sodium: 137 mmol/L (ref 135–145)

## 2022-05-02 LAB — ECHOCARDIOGRAM COMPLETE
AR max vel: 3.51 cm2
AV Peak grad: 4 mmHg
Ao pk vel: 1 m/s
Area-P 1/2: 5.58 cm2
Height: 65 in
S' Lateral: 3 cm
Weight: 3714.31 oz

## 2022-05-02 LAB — CBC
HCT: 35.7 % — ABNORMAL LOW (ref 39.0–52.0)
Hemoglobin: 11.9 g/dL — ABNORMAL LOW (ref 13.0–17.0)
MCH: 35.1 pg — ABNORMAL HIGH (ref 26.0–34.0)
MCHC: 33.3 g/dL (ref 30.0–36.0)
MCV: 105.3 fL — ABNORMAL HIGH (ref 80.0–100.0)
Platelets: 135 10*3/uL — ABNORMAL LOW (ref 150–400)
RBC: 3.39 MIL/uL — ABNORMAL LOW (ref 4.22–5.81)
RDW: 14.7 % (ref 11.5–15.5)
WBC: 12.3 10*3/uL — ABNORMAL HIGH (ref 4.0–10.5)
nRBC: 0 % (ref 0.0–0.2)

## 2022-05-02 LAB — AMMONIA: Ammonia: 34 umol/L (ref 9–35)

## 2022-05-02 LAB — LACTIC ACID, PLASMA: Lactic Acid, Venous: 1.7 mmol/L (ref 0.5–1.9)

## 2022-05-02 LAB — RESP PANEL BY RT-PCR (FLU A&B, COVID) ARPGX2
Influenza A by PCR: NEGATIVE
Influenza B by PCR: NEGATIVE
SARS Coronavirus 2 by RT PCR: NEGATIVE

## 2022-05-02 MED ORDER — SODIUM CHLORIDE 0.9 % IV SOLN
1.0000 g | INTRAVENOUS | Status: AC
  Administered 2022-05-02 – 2022-05-05 (×4): 1 g via INTRAVENOUS
  Filled 2022-05-02 (×4): qty 10

## 2022-05-02 MED ORDER — EMPAGLIFLOZIN 10 MG PO TABS
10.0000 mg | ORAL_TABLET | Freq: Every day | ORAL | Status: DC
  Administered 2022-05-02 – 2022-05-09 (×8): 10 mg via ORAL
  Filled 2022-05-02 (×8): qty 1

## 2022-05-02 MED ORDER — IPRATROPIUM-ALBUTEROL 0.5-2.5 (3) MG/3ML IN SOLN
3.0000 mL | Freq: Three times a day (TID) | RESPIRATORY_TRACT | Status: DC
  Administered 2022-05-03 – 2022-05-04 (×4): 3 mL via RESPIRATORY_TRACT
  Filled 2022-05-02 (×4): qty 3

## 2022-05-02 MED ORDER — PRIMIDONE 250 MG PO TABS: 250.0000 mg | ORAL_TABLET | Freq: Every day | ORAL | Status: DC

## 2022-05-02 MED ORDER — PRIMIDONE 250 MG PO TABS: 125.0000 mg | ORAL_TABLET | Freq: Every day | ORAL | Status: DC

## 2022-05-02 MED ORDER — FA-PYRIDOXINE-CYANOCOBALAMIN 2.5-25-2 MG PO TABS
1.0000 | ORAL_TABLET | Freq: Every day | ORAL | Status: DC
  Administered 2022-05-02 – 2022-05-09 (×8): 1 via ORAL
  Filled 2022-05-02 (×8): qty 1

## 2022-05-02 MED ORDER — IPRATROPIUM-ALBUTEROL 0.5-2.5 (3) MG/3ML IN SOLN
3.0000 mL | Freq: Two times a day (BID) | RESPIRATORY_TRACT | Status: DC
  Filled 2022-05-02: qty 3

## 2022-05-02 NOTE — Assessment & Plan Note (Addendum)
Present on admission   Active Pressure Injury/Wound(s)     Pressure Ulcer  Duration          Pressure Injury 05/02/22 Coccyx Posterior;Medial Stage 1 -  Intact skin with non-blanchable redness of a localized area usually over a bony prominence. <1 day   Pressure Injury 05/02/22 Heel Right Stage 2 -  Partial thickness loss of dermis presenting as a shallow open injury with a red, pink wound bed without slough. <1 day

## 2022-05-02 NOTE — TOC Initial Note (Signed)
Transition of Care [facility]) - Initial/Assessment Note    Patient Details  Name: Billy Smith MRN: [id number] Date of Birth: 04-10-1933  Transition of [facility]) CM/SW Contact:    Erin Sons, LCSW Phone Number: 05/02/2022, 3:26 PM  Clinical Narrative:                  CSW confirmed with [facility] that pt is LTC there. TOC will follow to assist with return to [facility] and any other potential DC needs.   Expected Discharge Plan: Skilled Nursing Facility Barriers to Discharge: Continued Medical Work up   Patient Goals and CMS Choice        Expected Discharge Plan and Services Expected Discharge Plan: Skilled Nursing Facility                                              Prior Living Arrangements/Services                       Activities of Daily Living      Permission Sought/Granted                  Emotional Assessment              Admission diagnosis:  Respiratory distress [R06.03] Acute respiratory failure with hypoxia and hypercapnia (HCC) [J96.01, J96.02] Acute hypercapnic respiratory failure (HCC) [J96.02] Patient Active Problem List   Diagnosis Date Noted   Pressure injury of skin 05/02/2022   Class 2 obesity 05/02/2022   Acute on chronic diastolic CHF (congestive heart failure) (HCC) 05/01/2022   Acute hypercapnic respiratory failure (HCC) 05/01/2022   Acute metabolic encephalopathy 05/01/2022   Atrial flutter (HCC) 05/01/2022   HYPERLIPIDEMIA 05/06/2009   SLEEP APNEA, OBSTRUCTIVE 05/06/2009   Coronary artery disease 05/06/2009   VENOUS INSUFFICIENCY 05/06/2009   COPD (chronic obstructive pulmonary disease) (HCC) 05/06/2009   NEPHROLITHIASIS, HX OF 05/06/2009   CORONARY ARTERY BYPASS GRAFT, HX OF 05/06/2009   PCP:  Paulina Fusi, MD Pharmacy:   [facility] [phone] Rosalita Levan,  - [address] AT [facility] [address] [place] [address] Phone: [phone] Fax:  [phone]  [facility] [address], [place] - [address] [place] [address] Phone: [phone] Fax: [phone]  [facility] [place], [place] [address] [place] [address] Phone: [phone] Fax: [phone]     Social Determinants of Health (SDOH) Interventions    Readmission Risk Interventions     No data to display

## 2022-05-02 NOTE — Assessment & Plan Note (Signed)
Sp CABG No chest pain.

## 2022-05-02 NOTE — Evaluation (Signed)
Physical Therapy Evaluation Patient Details Name: Billy Smith MRN: [id number] DOB: 1933-04-17 Today's Date: 05/02/2022  History of Present Illness  Pt is an 88 y.o. male admitted from SNF on 05/01/22 with respiratory distress, lethargy, BLE edema. Workup for acute on chronic CHF. Of note, recent hospitalization in [facility] for PNA, HF, aflutter with d/c to SNF for rehab. Other PMH includes COPD, CAD (s/p CABG), OSA, tremors.   Clinical Impression  Pt presents with an overall decrease in functional mobility secondary to above. PTA, pt has been at [facility] for rehab, most recently ambulating with RW and assist from staff. Today, pt able to ambulate [place] ambulation distance with RW and min-modA; noted DOE, SpO2 92-96% on RA. Pt would benefit from continued acute PT services to maximize functional mobility and independence prior to return to SNF.   Recommendations for follow up therapy are one component of a multi-disciplinary discharge planning process, led by the attending physician.  Recommendations may be updated based on patient status, additional functional criteria and insurance authorization.  Follow Up Recommendations Skilled nursing-[facility] term rehab (<3 hours/day) Can patient physically be transported by private vehicle: Yes    Assistance Recommended at Discharge Frequent or constant Supervision/Assistance  Patient can return home with the following  A lot of help with walking and/or transfers;A lot of help with bathing/dressing/bathroom;Assistance with cooking/housework;Direct supervision/assist for medications management;Direct supervision/assist for financial management;Assist for transportation;Help with stairs or ramp for entrance    Equipment Recommendations  (defer to next venue)  Recommendations for Other Services       Functional Status Assessment Patient has had a recent decline in their functional status and demonstrates the ability to make significant  improvements in function in a reasonable and predictable amount of time.     Precautions / Restrictions Precautions Precautions: Fall;Other (comment) Precaution Comments: Watch SpO2 (does not wear baseline) Restrictions Weight Bearing Restrictions: No      Mobility  Bed Mobility Overal bed mobility: Needs Assistance Bed Mobility: Supine to Sit     Supine to sit: Mod assist     General bed mobility comments: modA for HHA to elevate trunk    Transfers Overall transfer level: Needs assistance Equipment used: Rolling walker (2 wheels) Transfers: Sit to/from Stand Sit to Stand: Mod assist           General transfer comment: modA for trunk elevation standing from EOB to RW    Ambulation/Gait Ambulation/Gait assistance: Min guard, Min assist Gait Distance (Feet): 12 Feet Assistive device: Rolling walker (2 wheels) Gait Pattern/deviations: Step-to pattern, Step-through pattern, Decreased stride length, Wide base of support, Trunk flexed Gait velocity: Decreased     General Gait Details: slow, tremulous gait with RW and close min guard for balance, intermittent minA for RW management; cues for upright posture and to maintain closer proximity to [employer]    Modified Rankin (Stroke Patients Only)       Balance Overall balance assessment: Needs assistance   Sitting balance-Leahy Scale: Fair     Standing balance support: During functional activity, Reliant on assistive device for balance Standing balance-Leahy Scale: Poor                               Pertinent Vitals/Pain Pain Assessment Pain Assessment: No/denies pain    Home Living Family/patient expects to be discharged to:: Skilled nursing facility  Additional Comments: prior to admission to [facility] this summer, pt was living with daughter, could be alone at house during day while she worked    Prior Function Prior  Level of Function : Needs assist             Mobility Comments: was ambulating at SNF with RW and assist from staff, daughter reports pt walking 200'       Hand Dominance        Extremity/Trunk Assessment   Upper Extremity Assessment Upper Extremity Assessment: Generalized weakness    Lower Extremity Assessment Lower Extremity Assessment: Generalized weakness (noted bilateral lower leg swelling, daughter notes improvement since admission)    Cervical / Trunk Assessment Cervical / Trunk Assessment: Kyphotic  Communication   Communication: HOH  Cognition Arousal/Alertness: Awake/alert Behavior During Therapy: WFL for tasks assessed/performed Overall Cognitive Status: History of cognitive impairments - at baseline                                 General Comments: per daughter, h/o dementia. pt following simple commands appropriately, requires some repetition. HOH        General Comments General comments (skin integrity, edema, etc.): SpO2 92-96% on RA with activity, therefore O2 [place] left off (RN notified). pt's daughter Billy Smith) present and supportive.    Exercises     Assessment/Plan    PT Assessment Patient needs continued PT services  PT Problem List Decreased strength;Decreased activity tolerance;Decreased balance;Decreased mobility;Cardiopulmonary status limiting activity       PT Treatment Interventions DME instruction;Gait training;Functional mobility training;Therapeutic activities;Therapeutic exercise;Balance training;Patient/family education    PT Goals (Current goals can be found in the Care Plan section)  Acute Rehab PT Goals Patient Stated Goal: return to rehab with long term goal of returning home with family PT Goal Formulation: With patient/family Time For Goal Achievement: 05/16/22 Potential to Achieve Goals: Good    Frequency Min 3X/week     Co-evaluation               AM-PAC PT "6 Clicks" Mobility  Outcome Measure  Help needed turning from your back to your side while in a flat bed without using bedrails?: A Lot Help needed moving from lying on your back to sitting on the side of a flat bed without using bedrails?: A Lot Help needed moving to and from a bed to a chair (including a wheelchair)?: A Lot Help needed standing up from a chair using your arms (e.g., wheelchair or bedside chair)?: A Lot Help needed to walk in hospital room?: A Lot Help needed climbing 3-5 steps with a railing? : A Lot 6 Click Score: 12    End of Session Equipment Utilized During Treatment: Gait belt Activity Tolerance: Patient tolerated treatment well Patient left: in chair;with call bell/phone within reach;with chair alarm set;with family/visitor present Nurse Communication: Mobility status;Other (comment) (bed wet with urine, pt needs washup, primofit not working?) PT Visit Diagnosis: Other abnormalities of gait and mobility (R26.89)    Time: 4081-4481 PT Time Calculation (min) (ACUTE ONLY): 29 min   Charges:   PT Evaluation $PT Eval Moderate Complexity: 1 Mod PT Treatments $Therapeutic Activity: 8-22 mins      Ina Homes, PT, DPT Acute Rehabilitation Services  Personal: Secure Chat Rehab Office: [phone]  Malachy Chamber 05/02/2022, 5:07 PM

## 2022-05-02 NOTE — Assessment & Plan Note (Addendum)
Echocardiogram with preserved LV systolic function with EF 60 to 65%. Mild LVH. RVSP 39.2 mmHg.   Patient was placed on furosemide for diuresis, negative fluid balance was achieved, -4.053 with significant improvement in his symptoms.   Patient will continue medical therapy with empagliflozin, spironolactone and torsemide.   Acute on chronic hypoxemic and hypercapnic respiratory failure. (present on admission).   Clinically has improved with diuresis.   Patient was liberated from non invasive mechanical ventilation.  At the time of his discharge his 02 saturation was 92% on room air.

## 2022-05-02 NOTE — Progress Notes (Signed)
Heart Failure Navigator Progress Note  Following this hospitalization to assess for HV TOC readiness.   Echo pending?  Last EF 50-55% ( 7/21)  Rhae Hammock, BSN, RN Heart Failure Print production planner Chat Only

## 2022-05-02 NOTE — Assessment & Plan Note (Signed)
Calculated BMI is 38,6

## 2022-05-02 NOTE — Progress Notes (Addendum)
Progress Note   Patient: Billy Smith FGH:[id number] DOB: August 07, 1933 DOA: 05/01/2022     1 DOS: the patient was seen and examined on 05/02/2022   Brief hospital course: Billy Smith was admitted to the hospital with the working diagnosis of decompensated heart failure.   88 yo male with the past medical history of diastolic heart failure, atrial flutter, COPD, and coronary artery disease sp CABG who presented with dyspnea. Recently hospitalized in [facility] for pneumonia, heart failure and uncontrolled atrial flutter. Patient was discharged to SNF. Noted several days of congestion and dyspnea. On the day of hospitalization he was found slumped in his chair in respiratory distress, his 02 saturation was in th 70's. EMS was called, he was placed on C pap and transported to the hospital. In the ED he was transitioned to Bipap, he was lethargic and not able to respond to questions. His blood pressure was 147/114, HR 60, RR 18 and 02 saturation 100%, lungs with diffuse wheezing and rales, heart with S1 and S2 present and bradycardic, abdomen with no distention and positive lower extremity edema.   VBG 7.26/ 67.5/ 41/ 30 NA 135, K 4,2 CL 99, bicarbonate 28, glucose 129, bun 18 cr 1,24  BNP 152  High sensitive troponin 11 Wbc 7,0 hgb 11.8 plt 156   Chest radiograph with left rotation, with cardiomegaly, bilateral hilar vascular congestion and interstitial infiltrates, more predominant at the lower zones more on the left. Small pleural effusion on the left. Sternotomy wires in place.   EKG 65 bpm, right axis deviation, normal intervals, sinus rhythm with no significant ST segment or T wave changes.    Patient was placed on furosemide for diuresis Received IV antibiotic therapy and IV systemic corticosteroids.   Assessment and Plan: * Acute on chronic diastolic CHF (congestive heart failure) (HCC) 2021 echocardiogram with 50 to 55%m with no significant valvular disease.  No available echo  results from recent hospitalization  Patient has been placed on furosemide for diuresis, his urine output documentation is not accurate but his edema and dyspnea have improved. He has been liberated from non invasive mechanical ventilation.  Systolic blood pressure 105 to 119 mmHg.  Plan to continue diuresis drip furosemide to further target negative fluid balance.  Will add SGLT 2 inh  Hold on spironolactone due to mild elevated K. If tolerated will benefit from ARB  Follow up on repeat echocardiogram.   Acute on chronic hypoxemic and hypercapnic respiratory failure. (present on admission).   Clinically has improved with diuresis.  Plan for follow up chest radiograph in am after further diuresis.  Wbc is elevated but patient received IV systemic steroids.  Doubt he has pneumonia, will de escalate antibiotic therapy for now to ceftriaxone. Possible discontinue antibiotic therapy tomorrow if no further signs of infection.     Acute hypercapnic respiratory failure (HCC) Most likely secondary to acute COPD and acute CHF exacerbation Patient was hypoxic in the field with room air pulse oximetry in the 70s ABG shows uncompensated respiratory acidosis He is currently on BiPAP Repeat ABG in 2 hours We will attempt to wean patient off BiPAP as tolerated  Acute metabolic encephalopathy His mentation has improved, by the time of my evaluation he is back to his baseline. His daughter is at the bedside.   Essential tremors, will resume primidone.   Atrial flutter (HCC) Paroxysmal Patient has been maintained on sinus rhythm, plan to continue anticoagulation with apixaban.   COPD (chronic obstructive pulmonary disease) (HCC) Patient  with no clinical signs of acute exacerbation  Plan to continue bronchodilator therapy Hold on further systemic corticosteroids.  Continue oxymetry monitoring and diuresis for pulmonary edema.   Pressure injury of skin Present on admission   Active  Pressure Injury/Wound(s)     Pressure Ulcer  Duration          Pressure Injury 05/02/22 Coccyx Posterior;Medial Stage 1 -  Intact skin with non-blanchable redness of a localized area usually over a bony prominence. <1 day   Pressure Injury 05/02/22 Heel Right Stage 2 -  Partial thickness loss of dermis presenting as a shallow open injury with a red, pink wound bed without slough. <1 day          Coronary artery disease Sp CABG No chest pain.   Class 2 obesity Calculated BMI is 38,6         Subjective: Patient with significant improvement in his dyspnea, now off Bipap.   Physical Exam: Vitals:   05/02/22 0834 05/02/22 0835 05/02/22 0836 05/02/22 1059  BP:   (!) 104/50 (!) 105/58  Pulse:   (!) 55 74  Resp:   18 17  Temp:      TempSrc:      SpO2: 100% 100% 100% 98%  Weight:      Height:       Neurology awake and alert ENT with no pallor Cardiovascular with S1 and S2 present and rhythmic with no gallops, rubs or murmurs Moderate JVD Positive lower extremity edema +++ pitting Respiratory with rales at bases with no wheezing or rhonchi  Abdomen with no distention  Data Reviewed:    Family Communication: I spoke with patient's daughter at the bedside, we talked in detail about patient's condition, plan of care and prognosis and all questions were addressed.   Disposition: Status is: Inpatient Remains inpatient appropriate because: heart failure and respiratory failure   Planned Discharge Destination: Skilled nursing facility      Author: Coralie Keens, MD 05/02/2022 1:42 PM  For on call review [email or web address].

## 2022-05-02 NOTE — Hospital Course (Addendum)
Billy Smith was admitted to the hospital with the working diagnosis of decompensated heart failure.   88 yo male with the past medical history of diastolic heart failure, atrial flutter, COPD, and coronary artery disease sp CABG who presented with dyspnea. Recently hospitalized in [facility] for pneumonia, heart failure and uncontrolled atrial flutter. Patient was discharged to SNF. Noted several days of congestion and dyspnea. On the day of hospitalization he was found slumped in his chair in respiratory distress, his 02 saturation was in th 70's. EMS was called, he was placed on C pap and transported to the hospital. In the ED he was transitioned to Bipap, he was lethargic and not able to respond to questions. His blood pressure was 147/114, HR 60, RR 18 and 02 saturation 100%, lungs with diffuse wheezing and rales, heart with S1 and S2 present and bradycardic, abdomen with no distention and positive lower extremity edema.   VBG 7.26/ 67.5/ 41/ 30 NA 135, K 4,2 CL 99, bicarbonate 28, glucose 129, bun 18 cr 1,24  BNP 152  High sensitive troponin 11 Wbc 7,0 hgb 11.8 plt 156   Chest radiograph with left rotation, with cardiomegaly, bilateral hilar vascular congestion and interstitial infiltrates, more predominant at the lower zones more on the left. Small pleural effusion on the left. Sternotomy wires in place.   EKG 65 bpm, right axis deviation, normal intervals, sinus rhythm with no significant ST segment or T wave changes.    Patient was placed on furosemide for diuresis Received IV antibiotic therapy and IV systemic corticosteroids.   Patient had improvement in volume status along with his respiratory failure.  Follow up as outpatient.

## 2022-05-02 NOTE — Progress Notes (Signed)
Rounding Note    Patient Name: Billy Smith Date of Encounter: 05/02/2022  [facility] Cardiologist: Verne Carrow, MD   Subjective   Patient seen examined his bedside. His daughter was at the bedside when I arrived. He is still on bipap.  Inpatient Medications    Scheduled Meds:  apixaban  5 mg Oral BID   aspirin EC  81 mg Oral Daily   atorvastatin  40 mg Oral QHS   brimonidine  1 drop Both Eyes BID   budesonide  2 mL Inhalation BID   cholecalciferol  5,000 Units Oral Daily   dorzolamide-timolol  1 drop Right Eye BID   folic acid-pyridoxine-cyancobalamin  1 tablet Oral Daily   ipratropium-albuterol  3 mL Inhalation TID   latanoprost  1 drop Both Eyes QHS   levothyroxine  125 mcg Oral Daily   primidone  250 mg Oral QHS   And   primidone  125 mg Oral q morning   Continuous Infusions:  furosemide (LASIX) 200 mg in dextrose 5 % 100 mL (2 mg/mL) infusion 8 mg/hr (05/02/22 0817)   meropenem (MERREM) IV 1 g (05/01/22 2231)   [START ON 05/03/2022] vancomycin     PRN Meds: acetaminophen **OR** acetaminophen, albuterol, ondansetron **OR** ondansetron (ZOFRAN) IV, senna   Vital Signs    Vitals:   05/02/22 0600 05/02/22 0834 05/02/22 0835 05/02/22 0836  BP: 117/66   (!) 104/50  Pulse:    (!) 55  Resp:    18  Temp:      TempSrc:      SpO2:  100% 100% 100%  Weight:      Height:        Intake/Output Summary (Last 24 hours) at 05/02/2022 0841 Last data filed at 05/02/2022 0300 Gross per 24 hour  Intake 374.85 ml  Output 250 ml  Net 124.85 ml      05/02/2022   12:20 AM 05/01/2022    2:26 PM 03/09/2020    1:49 PM  Last 3 Weights  Weight (lbs) 232 lb 2.3 oz 223 lb 15.8 oz 224 lb  Weight (kg) 105.3 kg 101.6 kg 101.606 kg      Telemetry    Sinus br - Personally Reviewed  ECG    None today- Personally Reviewed  Physical Exam   GEN: No acute distress.   Neck: No JVD Cardiac: RRR, no murmurs, rubs, or gallops.  Respiratory: Clear to  auscultation bilaterally. GI: Soft, nontender, non-distended  MS: anasarca, No deformity. Neuro:  Nonfocal  Psych: Normal affect   Labs    High Sensitivity Troponin:   Recent Labs  Lab 05/01/22 1654 05/01/22 1831  TROPONINIHS 11 11     Chemistry Recent Labs  Lab 05/01/22 1422 05/01/22 1446 05/01/22 1813 05/01/22 2102 05/02/22 0040  NA 135 134*  134* 135 133* 137  K 4.2 4.2  4.2 4.4 4.5 5.0  CL 99 96*  --   --  97*  CO2 28  --   --   --  27  GLUCOSE 129* 127*  --   --  126*  BUN 18 18  --   --  18  CREATININE 1.24 1.20  --   --  1.05  CALCIUM 8.5*  --   --   --  8.6*  PROT 6.4*  --   --   --   --   ALBUMIN 3.2*  --   --   --   --   AST 27  --   --   --   --  ALT 20  --   --   --   --   ALKPHOS 63  --   --   --   --   BILITOT 0.5  --   --   --   --   GFRNONAA 56*  --   --   --  >60  ANIONGAP 8  --   --   --  13    Lipids No results for input(s): "CHOL", "TRIG", "HDL", "LABVLDL", "LDLCALC", "CHOLHDL" in the last 168 hours.  Hematology Recent Labs  Lab 05/01/22 1422 05/01/22 1446 05/01/22 1813 05/01/22 2102 05/02/22 0040  WBC 7.0  --   --   --  12.3*  RBC 3.34*  --   --   --  3.39*  HGB 11.8*   < > 12.2* 11.6* 11.9*  HCT 35.2*   < > 36.0* 34.0* 35.7*  MCV 105.4*  --   --   --  105.3*  MCH 35.3*  --   --   --  35.1*  MCHC 33.5  --   --   --  33.3  RDW 14.9  --   --   --  14.7  PLT 156  --   --   --  135*   < > = values in this interval not displayed.   Thyroid No results for input(s): "TSH", "FREET4" in the last 168 hours.  BNP Recent Labs  Lab 05/01/22 1423  BNP 152.7*    DDimer No results for input(s): "DDIMER" in the last 168 hours.   Radiology    CT CHEST WO CONTRAST  Result Date: 05/01/2022 CLINICAL DATA:  Respiratory illness, nondiagnostic x-ray. Shortness of breath. EXAM: CT CHEST WITHOUT CONTRAST TECHNIQUE: Multidetector CT imaging of the chest was performed following the standard protocol without IV contrast. RADIATION DOSE REDUCTION:  This exam was performed according to the departmental dose-optimization program which includes automated exposure control, adjustment of the mA and/or kV according to patient size and/or use of iterative reconstruction technique. COMPARISON:  03/13/2022, 05/01/2022. FINDINGS: Cardiovascular: The heart is enlarged and there is no pericardial effusion. Multi-vessel coronary artery calcifications are noted. There is atherosclerotic calcification of the aorta without evidence of aneurysm. The pulmonary trunk is normal in caliber. Mediastinum/Nodes: No mediastinal or axillary lymphadenopathy. Evaluation of the hila is limited due to lack of IV contrast. The thyroid gland is within normal limits. There is air in the proximal esophagus resulting in mass effect and narrowing of the mid to distal trachea, unchanged from the prior exam and likely chronic. Lungs/Pleura: Scattered airspace opacities are present in the lungs bilaterally in there is consolidation in the lower lobes, greater on the left than on the right. No effusion or pneumothorax. Upper Abdomen: No acute abnormality. Musculoskeletal: Sternotomy wires are noted over the midline. Degenerative changes are present in the thoracic spine. No acute osseous abnormality. IMPRESSION: 1. Scattered airspace opacities in the lungs bilaterally with bilateral lower lobe consolidation, possible atelectasis or pneumonia. 2. Cardiomegaly with multi-vessel coronary artery calcifications. 3. Aortic atherosclerosis. Electronically Signed   By: Thornell Sartorius M.D.   On: 05/01/2022 22:26   CT HEAD WO CONTRAST ( )  Result Date: 05/01/2022 CLINICAL DATA:  Mental status changes of unknown cause EXAM: CT HEAD WITHOUT CONTRAST TECHNIQUE: Contiguous axial images were obtained from the base of the skull through the vertex without intravenous contrast. RADIATION DOSE REDUCTION: This exam was performed according to the departmental dose-optimization program which includes automated  exposure control, adjustment of the  mA and/or kV according to patient size and/or use of iterative reconstruction technique. COMPARISON:  09/04/2012 FINDINGS: Brain: Generalized atrophy, cerebral and cerebellar. No midline shift or mass effect. Small vessel chronic ischemic changes of deep cerebral white matter. No intracranial hemorrhage, mass lesion, or evidence of acute infarction. No extra-axial fluid collections. Vascular: Atherosclerotic calcification of internal carotid and vertebral arteries at skull base Skull: Intact Sinuses/Orbits: Clear Other: N/A IMPRESSION: Atrophy with small vessel chronic ischemic changes of deep cerebral white matter. No acute intracranial abnormalities. Electronically Signed   By: Ulyses Southward M.D.   On: 05/01/2022 18:04   DG Chest Port 1 View  Result Date: 05/01/2022 CLINICAL DATA:  Shortness of breath EXAM: PORTABLE CHEST 1 VIEW COMPARISON:  Radiograph 03/16/2022 FINDINGS: Enlarged cardiomediastinal silhouette with prior median sternotomy and CABG. Pulmonary vascular congestion. Elevated left hemidiaphragm. There are left lower lung opacities. Suspected trace left pleural effusion. No visible pneumothorax. Bilateral shoulder degenerative changes. No acute osseous abnormality. IMPRESSION: Cardiomegaly with pulmonary vascular congestion. Left lower lung opacities, could reflect atelectasis, alveolar edema,, or potentially infection. Suspected trace left pleural effusion. Electronically Signed   By: Caprice Renshaw M.D.   On: 05/01/2022 14:41    Cardiac Studies   TTE 03/03/2020 IMPRESSIONS   1. Left ventricular ejection fraction, by estimation, is 50 to 55%. The left ventricle has low normal function. The left ventricle has no regional wall motion abnormalities. There is severe concentric left ventricular hypertrophy. Left ventricular  diastolic parameters are indeterminate.   2. Right ventricular systolic function is normal. The right ventricular  size is normal.   3. The  mitral valve is normal in structure. No evidence of mitral valve  regurgitation. No evidence of mitral stenosis.   4. The aortic valve has an indeterminant number of cusps can not rule out  a bicuspid aortic valve. Aortic valve regurgitation is not visualized. No  aortic stenosis is present.   5. Mild aortic root calcification.   6. The inferior vena cava is normal in size with greater than 50%  respiratory variability, suggesting right atrial pressure of 3 mmHg.   FINDINGS   Left Ventricle: Left ventricular ejection fraction, by estimation, is 50  to 55%. The left ventricle has low normal function. The left ventricle has  no regional wall motion abnormalities. The left ventricular internal  cavity size was normal in size.  There is severe concentric left ventricular hypertrophy. Left ventricular  diastolic parameters are indeterminate.   Right Ventricle: The right ventricular size is normal. No increase in  right ventricular wall thickness. Right ventricular systolic function is  normal.   Left Atrium: Left atrial size was normal in size.   Right Atrium: Right atrial size was normal in size.   Pericardium: There is no evidence of pericardial effusion.   Mitral Valve: The mitral valve is normal in structure. Normal mobility of  the mitral valve leaflets. No evidence of mitral valve regurgitation. No  evidence of mitral valve stenosis.   Tricuspid Valve: The tricuspid valve is normal in structure. Tricuspid  valve regurgitation is not demonstrated. No evidence of tricuspid  stenosis.   Aortic Valve: The aortic valve has an indeterminant number of cusps. .  There is mild thickening and mild calcification of the aortic valve.  Aortic valve regurgitation is not visualized. No aortic stenosis is  present. There is mild thickening of the  aortic valve. There is mild calcification of the aortic valve.   Pulmonic Valve: The pulmonic valve was normal in  structure. Pulmonic valve   regurgitation is not visualized. No evidence of pulmonic stenosis.   Aorta: Mild aortic root calcification. The aortic root is normal in size  and structure.   Venous: The inferior vena cava is normal in size with greater than 50%  respiratory variability, suggesting right atrial pressure of 3 mmHg.   IAS/Shunts: No atrial level shunt detected by color flow Doppler  Patient Profile     88 y.o. male  with a hx of atrial flutter, chronic diastolic dysfunction CHF, COPD, obstructive sleep apnea, coronary artery disease status post 4V CABG (LIMA to LAD, SVG to intermediate, SVG to circumflex marginal, SVG to PDA).  Assessment & Plan    Acute exacerbation of diastolic heart failure-clinically he still appears to be volume overloaded.  Although his clinical exam has improved compared to yesterday.  He would benefit from being on the Lasix drip for another day.  Unfortunately I do not believe his result from the input and output as his clinical exam has changed and the patient daughter by the bedside tells me that he has had significant output.  He seems to be at her net positive.  Will benefit from strict I's and O, daily weights.  Echo pending  Acute respiratory failure with hypoxia-he is still on BiPAP.  This could be multifactorial in the setting of his heart failure as well as his pneumonia.  He is on antibiotics for the pneumonia.  Hypertension-blood pressure acceptable continue to monitor.  Coronary artery disease-no need for any ischemic evaluation at this time.  Atrial flutter-in sinus rhythm.  Please continue his Eliquis.  Case discussed with the patient daughter and bedside nurse.  For questions or updates, please contact [facility] Please consult [email or web address] for contact info under  \    Signed, Jeison Delpilar, DO  05/02/2022, 8:41 AM

## 2022-05-03 ENCOUNTER — Inpatient Hospital Stay (HOSPITAL_COMMUNITY): Payer: PPO

## 2022-05-03 ENCOUNTER — Encounter (HOSPITAL_COMMUNITY): Payer: Self-pay | Admitting: Internal Medicine

## 2022-05-03 DIAGNOSIS — I5033 Acute on chronic diastolic (congestive) heart failure: Secondary | ICD-10-CM | POA: Diagnosis not present

## 2022-05-03 LAB — BASIC METABOLIC PANEL
Anion gap: 8 (ref 5–15)
BUN: 21 mg/dL (ref 8–23)
CO2: 29 mmol/L (ref 22–32)
Calcium: 8.6 mg/dL — ABNORMAL LOW (ref 8.9–10.3)
Chloride: 97 mmol/L — ABNORMAL LOW (ref 98–111)
Creatinine, Ser: 1.01 mg/dL (ref 0.61–1.24)
GFR, Estimated: >60 mL/min (ref >60)
Glucose, Bld: 112 mg/dL — ABNORMAL HIGH (ref 70–99)
Potassium: 3.6 mmol/L (ref 3.5–5.1)
Sodium: 134 mmol/L — ABNORMAL LOW (ref 135–145)

## 2022-05-03 MED ORDER — PRIMIDONE 250 MG PO TABS
250.0000 mg | ORAL_TABLET | Freq: Every morning | ORAL | Status: DC
  Administered 2022-05-03 – 2022-05-09 (×7): 250 mg via ORAL
  Filled 2022-05-03 (×7): qty 1

## 2022-05-03 MED ORDER — PRIMIDONE 250 MG PO TABS
125.0000 mg | ORAL_TABLET | Freq: Every day | ORAL | Status: DC
  Administered 2022-05-03 – 2022-05-08 (×6): 125 mg via ORAL
  Filled 2022-05-03 (×6): qty 1

## 2022-05-03 MED ORDER — FUROSEMIDE 10 MG/ML IJ SOLN
60.0000 mg | Freq: Two times a day (BID) | INTRAMUSCULAR | Status: DC
  Administered 2022-05-03 – 2022-05-06 (×8): 60 mg via INTRAVENOUS
  Filled 2022-05-03 (×8): qty 6

## 2022-05-03 NOTE — Progress Notes (Addendum)
Rounding Note    Patient Name: Billy PurlBobby J Jakob Date of Encounter: 05/03/2022  [facility] Cardiologist: Verne Carrowhristopher McAlhany, MD   Subjective   Patient seen examined his bedside. His daughter was at the bedside when I arrived. He is still on bipap.  Inpatient Medications    Scheduled Meds:  apixaban  5 mg Oral BID   aspirin EC  81 mg Oral Daily   atorvastatin  40 mg Oral QHS   brimonidine  1 drop Both Eyes BID   budesonide  2 mL Inhalation BID   cholecalciferol  5,000 Units Oral Daily   dorzolamide-timolol  1 drop Right Eye BID   empagliflozin  10 mg Oral Daily   folic acid-pyridoxine-cyancobalamin  1 tablet Oral Daily   ipratropium-albuterol  3 mL Inhalation TID   latanoprost  1 drop Both Eyes QHS   levothyroxine  125 mcg Oral Daily   primidone  125 mg Oral QHS   And   primidone  250 mg Oral q morning   Continuous Infusions:  cefTRIAXone (ROCEPHIN)  IV 1 g (05/02/22 1432)   furosemide (LASIX) 200 mg in dextrose 5 % 100 mL (2 mg/mL) infusion 8 mg/hr (05/03/22 0017)   PRN Meds: acetaminophen **OR** acetaminophen, albuterol, ondansetron **OR** ondansetron (ZOFRAN) IV, senna   Vital Signs    Vitals:   05/02/22 1600 05/02/22 1930 05/03/22 0000 05/03/22 0400  BP: (!) 104/54 (!) 117/59 117/60 (!) 109/59  Pulse: 64 80 69 63  Resp: 15 20 16 16   Temp:  98.6 F (37 C) 98.7 F (37.1 C) 98.3 F (36.8 C)  TempSrc:  Oral Oral Oral  SpO2: 95% 94% 90% 92%  Weight:      Height:        Intake/Output Summary (Last 24 hours) at 05/03/2022 0822 Last data filed at 05/03/2022 0521 Gross per 24 hour  Intake 381.73 ml  Output 1335 ml  Net -953.27 ml      05/02/2022   12:20 AM 05/01/2022    2:26 PM 03/09/2020    1:49 PM  Last 3 Weights  Weight (lbs) 232 lb 2.3 oz 223 lb 15.8 oz 224 lb  Weight (kg) 105.3 kg 101.6 kg 101.606 kg      Telemetry    Sinus br - Personally Reviewed  ECG    None today- Personally Reviewed  Physical Exam   GEN: No acute distress.    Neck: No JVD Cardiac: RRR, no murmurs, rubs, or gallops.  Respiratory: Clear to auscultation bilaterally. GI: Soft, nontender, non-distended  MS: anasarca, No deformity. Neuro:  Nonfocal  Psych: Normal affect   Labs    High Sensitivity Troponin:   Recent Labs  Lab 05/01/22 1654 05/01/22 1831  TROPONINIHS 11 11     Chemistry Recent Labs  Lab 05/01/22 1422 05/01/22 1446 05/01/22 1813 05/01/22 2102 05/02/22 0040  NA 135 134*  134* 135 133* 137  K 4.2 4.2  4.2 4.4 4.5 5.0  CL 99 96*  --   --  97*  CO2 28  --   --   --  27  GLUCOSE 129* 127*  --   --  126*  BUN 18 18  --   --  18  CREATININE 1.24 1.20  --   --  1.05  CALCIUM 8.5*  --   --   --  8.6*  PROT 6.4*  --   --   --   --   ALBUMIN 3.2*  --   --   --   --  AST 27  --   --   --   --   ALT 20  --   --   --   --   ALKPHOS 63  --   --   --   --   BILITOT 0.5  --   --   --   --   GFRNONAA 56*  --   --   --  >60  ANIONGAP 8  --   --   --  13    Lipids No results for input(s): "CHOL", "TRIG", "HDL", "LABVLDL", "LDLCALC", "CHOLHDL" in the last 168 hours.  Hematology Recent Labs  Lab 05/01/22 1422 05/01/22 1446 05/01/22 1813 05/01/22 2102 05/02/22 0040  WBC 7.0  --   --   --  12.3*  RBC 3.34*  --   --   --  3.39*  HGB 11.8*   < > 12.2* 11.6* 11.9*  HCT 35.2*   < > 36.0* 34.0* 35.7*  MCV 105.4*  --   --   --  105.3*  MCH 35.3*  --   --   --  35.1*  MCHC 33.5  --   --   --  33.3  RDW 14.9  --   --   --  14.7  PLT 156  --   --   --  135*   < > = values in this interval not displayed.   Thyroid No results for input(s): "TSH", "FREET4" in the last 168 hours.  BNP Recent Labs  Lab 05/01/22 1423  BNP 152.7*    DDimer No results for input(s): "DDIMER" in the last 168 hours.   Radiology    DG CHEST PORT 1 VIEW  Result Date: 05/03/2022 CLINICAL DATA:  CT no, pulmonary vascular congestion EXAM: PORTABLE CHEST 1 VIEW COMPARISON:  Radiograph 05/01/2022 FINDINGS: Unchanged cardiomediastinal silhouette with  prior median sternotomy and CABG. Improvement in left lower lung aeration in comparison to prior exam, likely improved atelectasis. Decreased interstitial opacities. Decreased left pleural effusion. No evidence of pneumothorax. Bones are unchanged. IMPRESSION: Decreased pulmonary edema and left pleural effusion with improving atelectasis in the left lung base. Electronically Signed   By: Caprice Renshaw M.D.   On: 05/03/2022 08:19   ECHOCARDIOGRAM COMPLETE  Result Date: 05/02/2022    ECHOCARDIOGRAM REPORT   Patient Name:   Billy Smith Corbit Date of Exam: 05/02/2022 Medical Rec #:  [id number]     Height:       65.0 in Accession #:    [id number]    Weight:       232.1 lb Date of Birth:  Sep 18, 1932     BSA:          2.107 m Patient Age:    89 years      BP:           105/58 mmHg Patient Gender: M             HR:           72 bpm. Exam Location:  Inpatient Procedure: 2D Echo, Cardiac Doppler and Color Doppler Indications:    CHF  History:        Patient has prior history of Echocardiogram examinations, most                 recent 03/03/2020. CAD.  Sonographer:    Eduard Roux Referring Phys: [id number] Ludmila Ebarb IMPRESSIONS  1. Left ventricular ejection fraction, by estimation, is 60 to 65%. The left ventricle has  normal function. Left ventricular endocardial border not optimally defined to evaluate regional wall motion. There is mild concentric left ventricular hypertrophy. Left ventricular diastolic parameters are consistent with Grade II diastolic dysfunction (pseudonormalization).  2. Right ventricular systolic function was not well visualized. The right ventricular size is not well visualized. There is mildly elevated pulmonary artery systolic pressure. The estimated right ventricular systolic pressure is 39.2 mmHg.  3. The mitral valve is grossly normal. No evidence of mitral valve regurgitation. No evidence of mitral stenosis.  4. The aortic valve is tricuspid. Aortic valve regurgitation is not visualized. Aortic  valve sclerosis is present, with no evidence of aortic valve stenosis.  5. The inferior vena cava is dilated in size with <50% respiratory variability, suggesting right atrial pressure of 15 mmHg. Comparison(s): No significant change from prior study. FINDINGS  Left Ventricle: Left ventricular ejection fraction, by estimation, is 60 to 65%. The left ventricle has normal function. Left ventricular endocardial border not optimally defined to evaluate regional wall motion. The left ventricular internal cavity size was normal in size. There is mild concentric left ventricular hypertrophy. Left ventricular diastolic parameters are consistent with Grade II diastolic dysfunction (pseudonormalization). Right Ventricle: The right ventricular size is not well visualized. Right vetricular wall thickness was not well visualized. Right ventricular systolic function was not well visualized. There is mildly elevated pulmonary artery systolic pressure. The tricuspid regurgitant velocity is 2.46 m/s, and with an assumed right atrial pressure of 15 mmHg, the estimated right ventricular systolic pressure is 39.2 mmHg. Left Atrium: Left atrial size was normal in size. Right Atrium: Right atrial size was normal in size. Pericardium: Trivial pericardial effusion is present. Presence of epicardial fat layer. Mitral Valve: The mitral valve is grossly normal. No evidence of mitral valve regurgitation. No evidence of mitral valve stenosis. Tricuspid Valve: The tricuspid valve is grossly normal. Tricuspid valve regurgitation is trivial. No evidence of tricuspid stenosis. Aortic Valve: The aortic valve is tricuspid. Aortic valve regurgitation is not visualized. Aortic valve sclerosis is present, with no evidence of aortic valve stenosis. Aortic valve peak gradient measures 4.0 mmHg. Pulmonic Valve: The pulmonic valve was grossly normal. Pulmonic valve regurgitation is not visualized. No evidence of pulmonic stenosis. Aorta: The aortic root and  ascending aorta are structurally normal, with no evidence of dilitation. Venous: The inferior vena cava is dilated in size with less than 50% respiratory variability, suggesting right atrial pressure of 15 mmHg. IAS/Shunts: The atrial septum is grossly normal.  LEFT VENTRICLE PLAX 2D LVIDd:         4.80 cm   Diastology LVIDs:         3.00 cm   LV e' medial:    5.98 cm/s LV PW:         1.30 cm   LV E/e' medial:  10.7 LV IVS:        1.20 cm   LV e' lateral:   7.42 cm/s LVOT diam:     2.10 cm   LV E/e' lateral: 8.6 LV SV:         62 LV SV Index:   30 LVOT Area:     3.46 cm  IVC IVC diam: 2.60 cm LEFT ATRIUM           Index LA diam:      3.60 cm 1.71 cm/m LA Vol (A4C): 35.6 ml 16.89 ml/m  AORTIC VALVE                 PULMONIC  VALVE AV Area (Vmax): 3.51 cm     PV Vmax:       0.86 m/s AV Vmax:        99.65 cm/s   PV Peak grad:  3.0 mmHg AV Peak Grad:   4.0 mmHg LVOT Vmax:      101.00 cm/s LVOT Vmean:     53.500 cm/s LVOT VTI:       0.180 m  AORTA Ao Root diam: 3.90 cm Ao Asc diam:  3.60 cm MITRAL VALVE               TRICUSPID VALVE MV Area (PHT): 5.58 cm    TR Peak grad:   24.2 mmHg MV Decel Time: 136 msec    TR Vmax:        246.00 cm/s MV E velocity: 63.80 cm/s MV A velocity: 57.60 cm/s  SHUNTS MV E/A ratio:  1.11        Systemic VTI:  0.18 m                            Systemic Diam: 2.10 cm Lennie Odor MD Electronically signed by Lennie Odor MD Signature Date/Time: 05/02/2022/2:23:19 PM    Final    CT CHEST WO CONTRAST  Result Date: 05/01/2022 CLINICAL DATA:  Respiratory illness, nondiagnostic x-ray. Shortness of breath. EXAM: CT CHEST WITHOUT CONTRAST TECHNIQUE: Multidetector CT imaging of the chest was performed following the standard protocol without IV contrast. RADIATION DOSE REDUCTION: This exam was performed according to the departmental dose-optimization program which includes automated exposure control, adjustment of the mA and/or kV according to patient size and/or use of iterative reconstruction  technique. COMPARISON:  03/13/2022, 05/01/2022. FINDINGS: Cardiovascular: The heart is enlarged and there is no pericardial effusion. Multi-vessel coronary artery calcifications are noted. There is atherosclerotic calcification of the aorta without evidence of aneurysm. The pulmonary trunk is normal in caliber. Mediastinum/Nodes: No mediastinal or axillary lymphadenopathy. Evaluation of the hila is limited due to lack of IV contrast. The thyroid gland is within normal limits. There is air in the proximal esophagus resulting in mass effect and narrowing of the mid to distal trachea, unchanged from the prior exam and likely chronic. Lungs/Pleura: Scattered airspace opacities are present in the lungs bilaterally in there is consolidation in the lower lobes, greater on the left than on the right. No effusion or pneumothorax. Upper Abdomen: No acute abnormality. Musculoskeletal: Sternotomy wires are noted over the midline. Degenerative changes are present in the thoracic spine. No acute osseous abnormality. IMPRESSION: 1. Scattered airspace opacities in the lungs bilaterally with bilateral lower lobe consolidation, possible atelectasis or pneumonia. 2. Cardiomegaly with multi-vessel coronary artery calcifications. 3. Aortic atherosclerosis. Electronically Signed   By: Thornell Sartorius M.D.   On: 05/01/2022 22:26   CT HEAD WO CONTRAST ( )  Result Date: 05/01/2022 CLINICAL DATA:  Mental status changes of unknown cause EXAM: CT HEAD WITHOUT CONTRAST TECHNIQUE: Contiguous axial images were obtained from the base of the skull through the vertex without intravenous contrast. RADIATION DOSE REDUCTION: This exam was performed according to the departmental dose-optimization program which includes automated exposure control, adjustment of the mA and/or kV according to patient size and/or use of iterative reconstruction technique. COMPARISON:  09/04/2012 FINDINGS: Brain: Generalized atrophy, cerebral and cerebellar. No midline  shift or mass effect. Small vessel chronic ischemic changes of deep cerebral white matter. No intracranial hemorrhage, mass lesion, or evidence of acute infarction. No extra-axial fluid collections. Vascular:  Atherosclerotic calcification of internal carotid and vertebral arteries at skull base Skull: Intact Sinuses/Orbits: Clear Other: N/A IMPRESSION: Atrophy with small vessel chronic ischemic changes of deep cerebral white matter. No acute intracranial abnormalities. Electronically Signed   By: Ulyses Southward M.D.   On: 05/01/2022 18:04   DG Chest Port 1 View  Result Date: 05/01/2022 CLINICAL DATA:  Shortness of breath EXAM: PORTABLE CHEST 1 VIEW COMPARISON:  Radiograph 03/16/2022 FINDINGS: Enlarged cardiomediastinal silhouette with prior median sternotomy and CABG. Pulmonary vascular congestion. Elevated left hemidiaphragm. There are left lower lung opacities. Suspected trace left pleural effusion. No visible pneumothorax. Bilateral shoulder degenerative changes. No acute osseous abnormality. IMPRESSION: Cardiomegaly with pulmonary vascular congestion. Left lower lung opacities, could reflect atelectasis, alveolar edema,, or potentially infection. Suspected trace left pleural effusion. Electronically Signed   By: Caprice Renshaw M.D.   On: 05/01/2022 14:41    Cardiac Studies   TTE 05/02/2022 IMPRESSIONS     1. Left ventricular ejection fraction, by estimation, is 60 to 65%. The  left ventricle has normal function. Left ventricular endocardial border  not optimally defined to evaluate regional wall motion. There is mild  concentric left ventricular  hypertrophy. Left ventricular diastolic parameters are consistent with  Grade II diastolic dysfunction (pseudonormalization).   2. Right ventricular systolic function was not well visualized. The right  ventricular size is not well visualized. There is mildly elevated  pulmonary artery systolic pressure. The estimated right ventricular  systolic pressure  is 39.2 mmHg.   3. The mitral valve is grossly normal. No evidence of mitral valve  regurgitation. No evidence of mitral stenosis.   4. The aortic valve is tricuspid. Aortic valve regurgitation is not  visualized. Aortic valve sclerosis is present, with no evidence of aortic  valve stenosis.   5. The inferior vena cava is dilated in size with <50% respiratory  variability, suggesting right atrial pressure of 15 mmHg.   Comparison(s): No significant change from prior study.   FINDINGS   Left Ventricle: Left ventricular ejection fraction, by estimation, is 60  to 65%. The left ventricle has normal function. Left ventricular  endocardial border not optimally defined to evaluate regional wall motion.  The left ventricular internal cavity  size was normal in size. There is mild concentric left ventricular  hypertrophy. Left ventricular diastolic parameters are consistent with  Grade II diastolic dysfunction (pseudonormalization).   Right Ventricle: The right ventricular size is not well visualized. Right  vetricular wall thickness was not well visualized. Right ventricular  systolic function was not well visualized. There is mildly elevated  pulmonary artery systolic pressure. The  tricuspid regurgitant velocity is 2.46 m/s, and with an assumed right  atrial pressure of 15 mmHg, the estimated right ventricular systolic  pressure is 39.2 mmHg.   Left Atrium: Left atrial size was normal in size.   Right Atrium: Right atrial size was normal in size.   Pericardium: Trivial pericardial effusion is present. Presence of  epicardial fat layer.   Mitral Valve: The mitral valve is grossly normal. No evidence of mitral  valve regurgitation. No evidence of mitral valve stenosis.   Tricuspid Valve: The tricuspid valve is grossly normal. Tricuspid valve  regurgitation is trivial. No evidence of tricuspid stenosis.   Aortic Valve: The aortic valve is tricuspid. Aortic valve regurgitation is   not visualized. Aortic valve sclerosis is present, with no evidence of  aortic valve stenosis. Aortic valve peak gradient measures 4.0 mmHg.   Pulmonic Valve: The pulmonic valve was  grossly normal. Pulmonic valve  regurgitation is not visualized. No evidence of pulmonic stenosis.   Aorta: The aortic root and ascending aorta are structurally normal, with  no evidence of dilitation.   Venous: The inferior vena cava is dilated in size with less than 50%  respiratory variability, suggesting right atrial pressure of 15 mmHg.   IAS/Shunts: The atrial septum is grossly normal.    TTE 03/03/2020 IMPRESSIONS   1. Left ventricular ejection fraction, by estimation, is 50 to 55%. The left ventricle has low normal function. The left ventricle has no regional wall motion abnormalities. There is severe concentric left ventricular hypertrophy. Left ventricular  diastolic parameters are indeterminate.   2. Right ventricular systolic function is normal. The right ventricular  size is normal.   3. The mitral valve is normal in structure. No evidence of mitral valve  regurgitation. No evidence of mitral stenosis.   4. The aortic valve has an indeterminant number of cusps can not rule out  a bicuspid aortic valve. Aortic valve regurgitation is not visualized. No  aortic stenosis is present.   5. Mild aortic root calcification.   6. The inferior vena cava is normal in size with greater than 50%  respiratory variability, suggesting right atrial pressure of 3 mmHg.   FINDINGS   Left Ventricle: Left ventricular ejection fraction, by estimation, is 50  to 55%. The left ventricle has low normal function. The left ventricle has  no regional wall motion abnormalities. The left ventricular internal  cavity size was normal in size.  There is severe concentric left ventricular hypertrophy. Left ventricular  diastolic parameters are indeterminate.   Right Ventricle: The right ventricular size is normal. No  increase in  right ventricular wall thickness. Right ventricular systolic function is  normal.   Left Atrium: Left atrial size was normal in size.   Right Atrium: Right atrial size was normal in size.   Pericardium: There is no evidence of pericardial effusion.   Mitral Valve: The mitral valve is normal in structure. Normal mobility of  the mitral valve leaflets. No evidence of mitral valve regurgitation. No  evidence of mitral valve stenosis.   Tricuspid Valve: The tricuspid valve is normal in structure. Tricuspid  valve regurgitation is not demonstrated. No evidence of tricuspid  stenosis.   Aortic Valve: The aortic valve has an indeterminant number of cusps. .  There is mild thickening and mild calcification of the aortic valve.  Aortic valve regurgitation is not visualized. No aortic stenosis is  present. There is mild thickening of the  aortic valve. There is mild calcification of the aortic valve.   Pulmonic Valve: The pulmonic valve was normal in structure. Pulmonic valve  regurgitation is not visualized. No evidence of pulmonic stenosis.   Aorta: Mild aortic root calcification. The aortic root is normal in size  and structure.   Venous: The inferior vena cava is normal in size with greater than 50%  respiratory variability, suggesting right atrial pressure of 3 mmHg.   IAS/Shunts: No atrial level shunt detected by color flow Doppler  Patient Profile     88 y.o. male  with a hx of atrial flutter, chronic diastolic dysfunction CHF, COPD, obstructive sleep apnea, coronary artery disease status post 4V CABG (LIMA to LAD, SVG to intermediate, SVG to circumflex marginal, SVG to PDA).  Assessment & Plan    Acute exacerbation of diastolic heart failure-he is improving clinically but still does have some volume.  He has had a negative balance.  I can now transition the patient off the Lasix drip to 60 mg IV twice daily.  Please continue daily weights. Will benefit from strict  I's and O, daily weights.  His echocardiogram show normal LV systolic function.  Acute respiratory failure with hypoxia-he is now off BiPAP and not on any oxygen supplements.  This could be multifactorial in the setting of his heart failure as well as his pneumonia.  He is on antibiotics for the pneumonia.  Hypertension-blood pressure acceptable continue to monitor.  Coronary artery disease-no need for any ischemic evaluation at this time.  Atrial flutter-in sinus rhythm.  Please continue his Eliquis.  Case discussed with the patient daughter and bedside nurse.  For questions or updates, please contact [facility] Please consult [email or web address] for contact info under  \    Signed, Ivianna Notch, DO  05/03/2022, 8:22 AM

## 2022-05-03 NOTE — Progress Notes (Addendum)
Heart Failure Nurse Navigator Progress Note  PCP: Paulina Fusi, MD PCP-Cardiologist: Clifton James Admission Diagnosis: Acute hypoxic respiratory failure and acute exacerbation of diastolic heart failure.  Admitted from: [facility] via EMS  Presentation:   Billy Smith presented with shortness of breath, wheezing, pneumonia, hypoxia with saturations in the 70's. Marland Kitchen Recently hospitalized at [facility] for CHF. Was given two DuoNebs and placed on CPAP. BP 147/114, HR 60, O2 100% on BiPAP. Patient HOH, BNP 152, IV lasix given, CXR: showed cardiomegaly with pulmonary venous congestion and possible infiltrate in the left lower lobe.   Patient was educated on the sign and symptoms of heart failure, dailyu weights, diet/ fluid restrictions. Taking all his medication as prescribed and attending all medical appointments. A hospital HF TOC appointment was cancelled per Dr. Jomarie Longs ( Palliative care consult) for GOC.   ECHO/ LVEF: 60-65% G2DD  Clinical Course:  Past Medical History:  Diagnosis Date   Abnormal involuntary movements(781.0)    CAD (coronary artery disease)    with 4V CABG 2004   Chronic airway obstruction, not elsewhere classified    Nephrolithiasis    Obstructive sleep apnea (adult) (pediatric)    Venous insufficiency      Social History   Socioeconomic History   Marital status: Married    Spouse name: Not on file   Number of children: 1   Years of education: Not on file   Highest education level: Not on file  Occupational History   Occupation: Retired-welder  Tobacco Use   Smoking status: Former    Packs/day: 0.50    Years: 20.00    Total pack years: 10.00    Types: Cigarettes    Quit date: 08/21/2002    Years since quitting: 19.7   Smokeless tobacco: Never   Tobacco comments:    pt stopped smoking after his bypass  Vaping Use   Vaping Use: Never used  Substance and Sexual Activity   Alcohol use: No   Drug use: No   Sexual activity: Not on file  Other Topics  Concern   Not on file  Social History Narrative   Not on file   Social Determinants of Health   Financial Resource Strain: Not on file  Food Insecurity: No Food Insecurity (05/03/2022)   Hunger Vital Sign    Worried About Running Out of Food in the Last Year: Never true    Ran Out of Food in the Last Year: Never true  Transportation Needs: No Transportation Needs (05/03/2022)   PRAPARE - Administrator, Civil Service (Medical): No    Lack of Transportation (Non-Medical): No  Physical Activity: Not on file  Stress: Not on file  Social Connections: Not on file    High Risk Criteria for Readmission and/or Poor Patient Outcomes: Heart failure hospital admissions (last 6 months): 1  No Show rate: 10 % Difficult social situation: No Demonstrates medication adherence: Yes Primary Language: English Literacy level: Reading and writing.   Barriers of Care:   Diet/ fluids restriction Daily weights  Considerations/Referrals:   Referral made to Heart Failure Pharmacist Stewardship: Yes Referral made to Heart Failure CSW/NCM TOC: No Referral made to Heart & Vascular TOC clinic: Yes, 05/15/2022 @ 10 am.   Items for Follow-up on DC/TOC: Diet/ fluid restrictions Daily weights   Rhae Hammock, BSN, RN Heart Failure Teacher, adult education Only

## 2022-05-03 NOTE — Progress Notes (Signed)
   05/03/22 1000  Mobility  Activity Ambulated with assistance in hallway  Level of Assistance Minimal assist, patient does 75% or more  Assistive Device Front wheel walker  Distance Ambulated (ft) 25 ft  Activity Response Tolerated well  $Mobility charge 1 Mobility   Mobility Specialist Progress Note  Pt was in bed and agreeable. Mobility cut Anglemyer d/t fatigue. Left in chair w/ alarm on and call bell in reach.   Anastasia Pall Mobility Specialist

## 2022-05-03 NOTE — Evaluation (Signed)
Occupational Therapy Evaluation Patient Details Name: AARO MEYERS MRN: [id number] DOB: 1932-11-09 Today's Date: 05/03/2022   History of Present Illness Pt is an 88 y.o. male admitted from SNF on 05/01/22 with respiratory distress, lethargy, BLE edema. Workup for acute on chronic CHF. Of note, recent hospitalization in [facility] for PNA, HF, aflutter with d/c to SNF for rehab. Other PMH includes COPD, CAD (s/p CABG), OSA, tremors.   Clinical Impression   Pt needing assist at baseline for ADLs and functional mobility. Pt currently needing mod A for ADLs, and min A for transfers with RW. Pt with 2/4 DOE noted with Strand distance ambulation in room, however VSS on RA. Pt presenting with impairments listed below, will follow acutely. Recommend SNF at d/c.      Recommendations for follow up therapy are one component of a multi-disciplinary discharge planning process, led by the attending physician.  Recommendations may be updated based on patient status, additional functional criteria and insurance authorization.   Follow Up Recommendations  Skilled nursing-[place] term rehab (<3 hours/day)    Assistance Recommended at Discharge Frequent or constant Supervision/Assistance  Patient can return home with the following A little help with walking and/or transfers;A lot of help with bathing/dressing/bathroom;Assistance with cooking/housework;Direct supervision/assist for medications management;Direct supervision/assist for financial management;Assist for transportation;Help with stairs or ramp for entrance    Functional Status Assessment  Patient has had a recent decline in their functional status and demonstrates the ability to make significant improvements in function in a reasonable and predictable amount of time.  Equipment Recommendations  None recommended by OT (defer)    Recommendations for Other Services PT consult     Precautions / Restrictions Precautions Precautions: Fall;Other  (comment) Precaution Comments: Watch SpO2 (does not wear baseline) Restrictions Weight Bearing Restrictions: No      Mobility Bed Mobility               General bed mobility comments: up in chair upon arrival and departure    Transfers Overall transfer level: Needs assistance Equipment used: Rolling walker (2 wheels) Transfers: Sit to/from Stand Sit to Stand: Min assist                  Balance Overall balance assessment: Needs assistance Sitting-balance support: Feet supported Sitting balance-Leahy Scale: Fair     Standing balance support: During functional activity, Reliant on assistive device for balance Standing balance-Leahy Scale: Poor Standing balance comment: reliant on external support                           ADL either performed or assessed with clinical judgement   ADL Overall ADL's : Needs assistance/impaired Eating/Feeding: Set up   Grooming: Min guard   Upper Body Bathing: Moderate assistance   Lower Body Bathing: Moderate assistance   Upper Body Dressing : Moderate assistance   Lower Body Dressing: Moderate assistance   Toilet Transfer: Rolling walker (2 wheels);Ambulation;Regular Toilet;Minimal assistance Toilet Transfer Details (indicate cue type and reason): simualted via functional mobility Toileting- Clothing Manipulation and Hygiene: Moderate assistance       Functional mobility during ADLs: Rolling walker (2 wheels);Minimal assistance       Vision   Vision Assessment?: No apparent visual deficits     Perception     Praxis      Pertinent Vitals/Pain Pain Assessment Pain Assessment: No/denies pain     Hand Dominance     Extremity/Trunk Assessment Upper Extremity Assessment Upper Extremity  Assessment: Generalized weakness   Lower Extremity Assessment Lower Extremity Assessment: Defer to PT evaluation   Cervical / Trunk Assessment Cervical / Trunk Assessment: Kyphotic   Communication  Communication Communication: HOH   Cognition Arousal/Alertness: Awake/alert Behavior During Therapy: WFL for tasks assessed/performed Overall Cognitive Status: Impaired/Different from baseline Area of Impairment: Awareness, Safety/judgement, Following commands, Memory                     Memory: Decreased Swords-term memory Following Commands: Follows one step commands with increased time Safety/Judgement: Decreased awareness of safety Awareness: Emergent (able to verbalize need for primofit to be hooked back up to urinate)   General Comments: hx of dementia at baseline     General Comments  VSS on RA    Exercises     Shoulder Instructions      Home Living Family/patient expects to be discharged to:: Skilled nursing facility                                 Additional Comments: prior to admission to [facility] hospital this summer, pt was living with daughter, could be alone at house during day while she worked      Prior Functioning/Environment Prior Level of Function : Needs assist             Mobility Comments: was ambulating at SNF with RW and assist from staff, daughter reports pt walking 200' ADLs Comments: reports needing assist from staff for ADLs        OT Problem List: Decreased strength;Decreased range of motion;Decreased activity tolerance;Impaired balance (sitting and/or standing);Decreased cognition;Decreased safety awareness;Cardiopulmonary status limiting activity      OT Treatment/Interventions: Self-care/ADL training;Therapeutic exercise;Energy conservation;Therapeutic activities;Patient/family education;Balance training;Cognitive remediation/compensation;DME and/or AE instruction    OT Goals(Current goals can be found in the care plan section) Acute Rehab OT Goals Patient Stated Goal: " to watch [place]" OT Goal Formulation: With patient Time For Goal Achievement: 05/17/22 Potential to Achieve Goals: Good ADL Goals Pt Will  Perform Grooming: with min guard assist;standing Pt Will Perform Upper Body Dressing: with min guard assist;sitting Pt Will Perform Lower Body Dressing: with min assist;sit to/from stand;sitting/lateral leans Pt Will Transfer to Toilet: with modified independence;regular height toilet;ambulating Pt Will Perform Tub/Shower Transfer: Shower transfer;Tub transfer;with min assist;rolling walker;ambulating Additional ADL Goal #1: pt will perform bed mobility with min guard A in prep for ADLs  OT Frequency: Min 2X/week    Co-evaluation              AM-PAC OT "6 Clicks" Daily Activity     Outcome Measure Help from another person eating meals?: None Help from another person taking care of personal grooming?: A Little Help from another person toileting, which includes using toliet, bedpan, or urinal?: A Lot Help from another person bathing (including washing, rinsing, drying)?: A Lot Help from another person to put on and taking off regular upper body clothing?: A Lot Help from another person to put on and taking off regular lower body clothing?: A Lot 6 Click Score: 15   End of Session Equipment Utilized During Treatment: Gait belt;Rolling walker (2 wheels) Nurse Communication: Mobility status  Activity Tolerance: Patient tolerated treatment well Patient left: in chair;with call bell/phone within reach;with chair alarm set  OT Visit Diagnosis: Unsteadiness on feet (R26.81);Muscle weakness (generalized) (M62.81);Other abnormalities of gait and mobility (R26.89)  Time: 1256-1316 OT Time Calculation (min): 20 min Charges:  OT General Charges $OT Visit: 1 Visit OT Evaluation $OT Eval Moderate Complexity: 1 [address] OTD, OTR/L Acute Rehab [phone]  Mayer Masker 05/03/2022, 4:21 PM

## 2022-05-03 NOTE — Progress Notes (Signed)
PROGRESS NOTE    Billy Smith  NUU:[id number] DOB: 04-06-1933 DOA: 05/01/2022 PCP: Paulina Fusi, MD  89/M with history of chronic diastolic CHF, COPD, OSA, CAD/CABG, atrial flutter on Eliquis was sent to the ED from SNF for evaluation of dyspnea wheezing and hypoxia.  Recently hospitalized at [facility] for CHF pneumonia and rapid atrial flutter, discharged to SNF for [facility]-term rehab.  On day of admission was found slumped down in the chair in respiratory distress, hypoxic O2 sats in the 70s. -In the ED lethargic and hypoxic placed on BiPAP, chest x-ray noted cardiomegaly, pulmonary vascular congestion and possible infiltrate in left lower lobe, CT chest with bilateral opacities, lower lobe consolidation   Subjective: Feels better, breathing is improving  Assessment and Plan:  Acute hypercapnic respiratory failure (HCC) -Aspiration pneumonia  -Developed sudden respiratory distress on day of admission, ABG noted uncompensated respiratory acidosis, treated with BiPAP -Improving -Antibiotics changed by Dr. Ella Jubilee from IV meropenem to ceftriaxone yesterday, continue same for now -We will check SLP evaluation, history of aspiration during recent hospitalization at [facility]   Acute metabolic encephalopathy Most likely secondary to hypercarbia, avoid sedating meds At baseline patient is usually awake, alert and conversant   Acute on chronic diastolic CHF (congestive heart failure) [facility]) -Recently hospitalized at [facility], 2D echo results unavailable at this time Last known LVEF was 50 to 55% from 2021 -Cards following, diuresed with Lasix gtt., 828 mL, suspect urine output is inaccurate -Cards following, changing to IV Lasix 60 Mg twice daily, -Continue empagliflozin   COPD with acute exacerbation (HCC) -DuoNebs, inhaled steroids   Paroxysmal atrial flutter (HCC) -Continue apixaban Beta-blockers on hold due to bradycardia   CAD, CABG -Stable, no concern  for ACS at this time  Essential tremors,  -primidone.  Pharmacy alerted me today to drug interaction with apixaban, will discuss with family and recommend tapering it down  Paroxysmal atrial flutter -In sinus rhythm, continue apixaban  COPD (chronic obstructive pulmonary disease) (HCC) -Continue as needed nebs  Pressure injury of skin Present on admission   Active Pressure Injury/Wound(s)     Pressure Ulcer  Duration          Pressure Injury 05/02/22 Coccyx Posterior;Medial Stage 1 -  Intact skin with non-blanchable redness of a localized area usually over a bony prominence. <1 day   Pressure Injury 05/02/22 Heel Right Stage 2 -  Partial thickness loss of dermis presenting as a shallow open injury with a red, pink wound bed without slough. <1 day         DVT prophylaxis: Code Status:  Family Communication: Disposition Plan:   Consultants:    Procedures:   Antimicrobials:    Objective: Vitals:   05/03/22 0000 05/03/22 0400 05/03/22 0900 05/03/22 1146  BP: 117/60 (!) 109/59 (!) 110/57 116/62  Pulse: 69 63 68 70  Resp: Temp: 98.7 F (37.1 C) 98.3 F (36.8 C) 98.1 F (36.7 C)   TempSrc: Oral Oral Oral   SpO2: 90% 92% 95% 93%  Weight:      Height:        Intake/Output Summary (Last 24 hours) at 05/03/2022 1255 Last data filed at 05/03/2022 0837 Gross per 24 hour  Intake 264.73 ml  Output 1335 ml  Net -1070.27 ml   Filed Weights   05/01/22 1426 05/02/22 0020  Weight: 101.6 kg 105.3 kg    Examination:  General exam: Chronically ill male sitting up in bed, awake alert oriented  to self and place, mild cognitive deficits HEENT: No JVD CVS: S1-S2, regular rhythm Lungs: Diffuse scattered rhonchi Abdomen: Soft, obese, nontender, bowel sounds present Extremities: 1-2+ edema Skin: No rashes on exposed skin Psychiatry:  Mood & affect appropriate.     Data Reviewed:   CBC: Recent Labs  Lab 05/01/22 1422 05/01/22 1446 05/01/22 1813  05/01/22 2102 05/02/22 0040  WBC 7.0  --   --   --  12.3*  NEUTROABS 4.7  --   --   --   --   HGB 11.8* 11.9*  11.6* 12.2* 11.6* 11.9*  HCT 35.2* 35.0*  34.0* 36.0* 34.0* 35.7*  MCV 105.4*  --   --   --  105.3*  PLT 156  --   --   --  135*   Basic Metabolic Panel: Recent Labs  Lab 05/01/22 1422 05/01/22 1446 05/01/22 1813 05/01/22 2102 05/02/22 0040 05/03/22 1024  NA 135 134*  134* 135 133* 137 134*  K 4.2 4.2  4.2 4.4 4.5 5.0 3.6  CL 99 96*  --   --  97* 97*  CO2 28  --   --   --  27 29  GLUCOSE 129* 127*  --   --  126* 112*  BUN 18 18  --   --  18 21  CREATININE 1.24 1.20  --   --  1.05 1.01  CALCIUM 8.5*  --   --   --  8.6* 8.6*   GFR: Estimated Creatinine Clearance: 57.6 mL/min (by C-G formula based on SCr of 1.01 mg/dL). Liver Function Tests: Recent Labs  Lab 05/01/22 1422  AST 27  ALT 20  ALKPHOS 63  BILITOT 0.5  PROT 6.4*  ALBUMIN 3.2*   No results for input(s): "LIPASE", "AMYLASE" in the last 168 hours. Recent Labs  Lab 05/02/22 0040  AMMONIA 34   Coagulation Profile: No results for input(s): "INR", "PROTIME" in the last 168 hours. Cardiac Enzymes: No results for input(s): "CKTOTAL", "CKMB", "CKMBINDEX", "TROPONINI" in the last 168 hours. BNP (last 3 results) No results for input(s): "PROBNP" in the last 8760 hours. HbA1C: No results for input(s): "HGBA1C" in the last 72 hours. CBG: No results for input(s): "GLUCAP" in the last 168 hours. Lipid Profile: No results for input(s): "CHOL", "HDL", "LDLCALC", "TRIG", "CHOLHDL", "LDLDIRECT" in the last 72 hours. Thyroid Function Tests: No results for input(s): "TSH", "T4TOTAL", "FREET4", "T3FREE", "THYROIDAB" in the last 72 hours. Anemia Panel: No results for input(s): "VITAMINB12", "FOLATE", "FERRITIN", "TIBC", "IRON", "RETICCTPCT" in the last 72 hours. Urine analysis: No results found for: "COLORURINE", "APPEARANCEUR", "LABSPEC", "PHURINE", "GLUCOSEU", "HGBUR", "BILIRUBINUR", "KETONESUR",  "PROTEINUR", "UROBILINOGEN", "NITRITE", "LEUKOCYTESUR" Sepsis Labs: @LABRCNTIP (procalcitonin:4,lacticidven:4)  ) Recent Results (from the past 240 hour(s))  Resp Panel by RT-PCR (Flu A&B, Covid) Anterior Nasal Swab     Status: None   Collection Time: 05/01/22  9:15 PM   Specimen: Anterior Nasal Swab  Result Value Ref Range Status   SARS Coronavirus 2 by RT PCR NEGATIVE NEGATIVE Final    Comment: (NOTE) SARS-CoV-2 target nucleic acids are NOT DETECTED.  The SARS-CoV-2 RNA is generally detectable in upper respiratory specimens during the acute phase of infection. The lowest concentration of SARS-CoV-2 viral copies this assay can detect is 138 copies/mL. A negative result does not preclude SARS-Cov-2 infection and should not be used as the sole basis for treatment or other patient management decisions. A negative result may occur with  improper specimen collection/handling, submission of specimen other than nasopharyngeal swab, presence  of viral mutation(s) within the areas targeted by this assay, and inadequate number of viral copies(<138 copies/mL). A negative result must be combined with clinical observations, patient history, and epidemiological information. The expected result is Negative.  Fact Sheet for Patients:  BloggerCourse.com  Fact Sheet for Healthcare Providers:  SeriousBroker.it  This test is no t yet approved or cleared by the [place] FDA and  has been authorized for detection and/or diagnosis of SARS-CoV-2 by FDA under an Emergency Use Authorization (EUA). This EUA will remain  in effect (meaning this test can be used) for the duration of the COVID-19 declaration under Section 564(b)(1) of the Act, 21 U.S.C.section 360bbb-3(b)(1), unless the authorization is terminated  or revoked sooner.       Influenza A by PCR NEGATIVE NEGATIVE Final   Influenza B by PCR NEGATIVE NEGATIVE Final    Comment: (NOTE) The  Xpert Xpress SARS-CoV-2/FLU/RSV plus assay is intended as an aid in the diagnosis of influenza from Nasopharyngeal swab specimens and should not be used as a sole basis for treatment. Nasal washings and aspirates are unacceptable for Xpert Xpress SARS-CoV-2/FLU/RSV testing.  Fact Sheet for Patients: BloggerCourse.com  Fact Sheet for Healthcare Providers: SeriousBroker.it  This test is not yet approved or cleared by the [place] FDA and has been authorized for detection and/or diagnosis of SARS-CoV-2 by FDA under an Emergency Use Authorization (EUA). This EUA will remain in effect (meaning this test can be used) for the duration of the COVID-19 declaration under Section 564(b)(1) of the Act, 21 U.S.C. section 360bbb-3(b)(1), unless the authorization is terminated or revoked.  Performed at [facility], [address] [place], [place] [address]      Radiology Studies: DG CHEST PORT 1 VIEW  Result Date: 05/03/2022 CLINICAL DATA:  CT no, pulmonary vascular congestion EXAM: PORTABLE CHEST 1 VIEW COMPARISON:  Radiograph 05/01/2022 FINDINGS: Unchanged cardiomediastinal silhouette with prior median sternotomy and CABG. Improvement in left lower lung aeration in comparison to prior exam, likely improved atelectasis. Decreased interstitial opacities. Decreased left pleural effusion. No evidence of pneumothorax. Bones are unchanged. IMPRESSION: Decreased pulmonary edema and left pleural effusion with improving atelectasis in the left lung base. Electronically Signed   By: Caprice Renshaw M.D.   On: 05/03/2022 08:19   ECHOCARDIOGRAM COMPLETE  Result Date: 05/02/2022    ECHOCARDIOGRAM REPORT   Patient Name:   Billy ALIBERTI Smith Date of Exam: 05/02/2022 Medical Rec #:  [id number]     Height:       65.0 in Accession #:    [id number]    Weight:       232.1 lb Date of Birth:  1933/03/18     BSA:          2.107 m Patient Age:    89 years      BP:            105/58 mmHg Patient Gender: M             HR:           72 bpm. Exam Location:  Inpatient Procedure: 2D Echo, Cardiac Doppler and Color Doppler Indications:    CHF  History:        Patient has prior history of Echocardiogram examinations, most                 recent 03/03/2020. CAD.  Sonographer:    Eduard Roux Referring Phys: [id number] KARDIE TOBB IMPRESSIONS  1. Left ventricular ejection fraction, by estimation,  is 60 to 65%. The left ventricle has normal function. Left ventricular endocardial border not optimally defined to evaluate regional wall motion. There is mild concentric left ventricular hypertrophy. Left ventricular diastolic parameters are consistent with Grade II diastolic dysfunction (pseudonormalization).  2. Right ventricular systolic function was not well visualized. The right ventricular size is not well visualized. There is mildly elevated pulmonary artery systolic pressure. The estimated right ventricular systolic pressure is 39.2 mmHg.  3. The mitral valve is grossly normal. No evidence of mitral valve regurgitation. No evidence of mitral stenosis.  4. The aortic valve is tricuspid. Aortic valve regurgitation is not visualized. Aortic valve sclerosis is present, with no evidence of aortic valve stenosis.  5. The inferior vena cava is dilated in size with <50% respiratory variability, suggesting right atrial pressure of 15 mmHg. Comparison(s): No significant change from prior study. FINDINGS  Left Ventricle: Left ventricular ejection fraction, by estimation, is 60 to 65%. The left ventricle has normal function. Left ventricular endocardial border not optimally defined to evaluate regional wall motion. The left ventricular internal cavity size was normal in size. There is mild concentric left ventricular hypertrophy. Left ventricular diastolic parameters are consistent with Grade II diastolic dysfunction (pseudonormalization). Right Ventricle: The right ventricular size is not well  visualized. Right vetricular wall thickness was not well visualized. Right ventricular systolic function was not well visualized. There is mildly elevated pulmonary artery systolic pressure. The tricuspid regurgitant velocity is 2.46 m/s, and with an assumed right atrial pressure of 15 mmHg, the estimated right ventricular systolic pressure is 39.2 mmHg. Left Atrium: Left atrial size was normal in size. Right Atrium: Right atrial size was normal in size. Pericardium: Trivial pericardial effusion is present. Presence of epicardial fat layer. Mitral Valve: The mitral valve is grossly normal. No evidence of mitral valve regurgitation. No evidence of mitral valve stenosis. Tricuspid Valve: The tricuspid valve is grossly normal. Tricuspid valve regurgitation is trivial. No evidence of tricuspid stenosis. Aortic Valve: The aortic valve is tricuspid. Aortic valve regurgitation is not visualized. Aortic valve sclerosis is present, with no evidence of aortic valve stenosis. Aortic valve peak gradient measures 4.0 mmHg. Pulmonic Valve: The pulmonic valve was grossly normal. Pulmonic valve regurgitation is not visualized. No evidence of pulmonic stenosis. Aorta: The aortic root and ascending aorta are structurally normal, with no evidence of dilitation. Venous: The inferior vena cava is dilated in size with less than 50% respiratory variability, suggesting right atrial pressure of 15 mmHg. IAS/Shunts: The atrial septum is grossly normal.  LEFT VENTRICLE PLAX 2D LVIDd:         4.80 cm   Diastology LVIDs:         3.00 cm   LV e' medial:    5.98 cm/s LV PW:         1.30 cm   LV E/e' medial:  10.7 LV IVS:        1.20 cm   LV e' lateral:   7.42 cm/s LVOT diam:     2.10 cm   LV E/e' lateral: 8.6 LV SV:         62 LV SV Index:   30 LVOT Area:     3.46 cm  IVC IVC diam: 2.60 cm LEFT ATRIUM           Index LA diam:      3.60 cm 1.71 cm/m LA Vol (A4C): 35.6 ml 16.89 ml/m  AORTIC VALVE  PULMONIC VALVE AV Area (Vmax):  3.51 cm     PV Vmax:       0.86 m/s AV Vmax:        99.65 cm/s   PV Peak grad:  3.0 mmHg AV Peak Grad:   4.0 mmHg LVOT Vmax:      101.00 cm/s LVOT Vmean:     53.500 cm/s LVOT VTI:       0.180 m  AORTA Ao Root diam: 3.90 cm Ao Asc diam:  3.60 cm MITRAL VALVE               TRICUSPID VALVE MV Area (PHT): 5.58 cm    TR Peak grad:   24.2 mmHg MV Decel Time: 136 msec    TR Vmax:        246.00 cm/s MV E velocity: 63.80 cm/s MV A velocity: 57.60 cm/s  SHUNTS MV E/A ratio:  1.11        Systemic VTI:  0.18 m                            Systemic Diam: 2.10 cm Lennie Odor MD Electronically signed by Lennie Odor MD Signature Date/Time: 05/02/2022/2:23:19 PM    Final    CT CHEST WO CONTRAST  Result Date: 05/01/2022 CLINICAL DATA:  Respiratory illness, nondiagnostic x-ray. Shortness of breath. EXAM: CT CHEST WITHOUT CONTRAST TECHNIQUE: Multidetector CT imaging of the chest was performed following the standard protocol without IV contrast. RADIATION DOSE REDUCTION: This exam was performed according to the departmental dose-optimization program which includes automated exposure control, adjustment of the mA and/or kV according to patient size and/or use of iterative reconstruction technique. COMPARISON:  03/13/2022, 05/01/2022. FINDINGS: Cardiovascular: The heart is enlarged and there is no pericardial effusion. Multi-vessel coronary artery calcifications are noted. There is atherosclerotic calcification of the aorta without evidence of aneurysm. The pulmonary trunk is normal in caliber. Mediastinum/Nodes: No mediastinal or axillary lymphadenopathy. Evaluation of the hila is limited due to lack of IV contrast. The thyroid gland is within normal limits. There is air in the proximal esophagus resulting in mass effect and narrowing of the mid to distal trachea, unchanged from the prior exam and likely chronic. Lungs/Pleura: Scattered airspace opacities are present in the lungs bilaterally in there is consolidation in the lower  lobes, greater on the left than on the right. No effusion or pneumothorax. Upper Abdomen: No acute abnormality. Musculoskeletal: Sternotomy wires are noted over the midline. Degenerative changes are present in the thoracic spine. No acute osseous abnormality. IMPRESSION: 1. Scattered airspace opacities in the lungs bilaterally with bilateral lower lobe consolidation, possible atelectasis or pneumonia. 2. Cardiomegaly with multi-vessel coronary artery calcifications. 3. Aortic atherosclerosis. Electronically Signed   By: Thornell Sartorius M.D.   On: 05/01/2022 22:26   CT HEAD WO CONTRAST ( )  Result Date: 05/01/2022 CLINICAL DATA:  Mental status changes of unknown cause EXAM: CT HEAD WITHOUT CONTRAST TECHNIQUE: Contiguous axial images were obtained from the base of the skull through the vertex without intravenous contrast. RADIATION DOSE REDUCTION: This exam was performed according to the departmental dose-optimization program which includes automated exposure control, adjustment of the mA and/or kV according to patient size and/or use of iterative reconstruction technique. COMPARISON:  09/04/2012 FINDINGS: Brain: Generalized atrophy, cerebral and cerebellar. No midline shift or mass effect. Small vessel chronic ischemic changes of deep cerebral white matter. No intracranial hemorrhage, mass lesion, or evidence of acute infarction. No extra-axial fluid collections.  Vascular: Atherosclerotic calcification of internal carotid and vertebral arteries at skull base Skull: Intact Sinuses/Orbits: Clear Other: N/A IMPRESSION: Atrophy with small vessel chronic ischemic changes of deep cerebral white matter. No acute intracranial abnormalities. Electronically Signed   By: Ulyses Southward M.D.   On: 05/01/2022 18:04   DG Chest Port 1 View  Result Date: 05/01/2022 CLINICAL DATA:  Shortness of breath EXAM: PORTABLE CHEST 1 VIEW COMPARISON:  Radiograph 03/16/2022 FINDINGS: Enlarged cardiomediastinal silhouette with prior median  sternotomy and CABG. Pulmonary vascular congestion. Elevated left hemidiaphragm. There are left lower lung opacities. Suspected trace left pleural effusion. No visible pneumothorax. Bilateral shoulder degenerative changes. No acute osseous abnormality. IMPRESSION: Cardiomegaly with pulmonary vascular congestion. Left lower lung opacities, could reflect atelectasis, alveolar edema,, or potentially infection. Suspected trace left pleural effusion. Electronically Signed   By: Caprice Renshaw M.D.   On: 05/01/2022 14:41     Scheduled Meds:  apixaban  5 mg Oral BID   aspirin EC  81 mg Oral Daily   atorvastatin  40 mg Oral QHS   brimonidine  1 drop Both Eyes BID   budesonide  2 mL Inhalation BID   cholecalciferol  5,000 Units Oral Daily   dorzolamide-timolol  1 drop Right Eye BID   empagliflozin  10 mg Oral Daily   folic acid-pyridoxine-cyancobalamin  1 tablet Oral Daily   furosemide  60 mg Intravenous BID   ipratropium-albuterol  3 mL Inhalation TID   latanoprost  1 drop Both Eyes QHS   levothyroxine  125 mcg Oral Daily   primidone  125 mg Oral QHS   And   primidone  250 mg Oral q morning   Continuous Infusions:  cefTRIAXone (ROCEPHIN)  IV 1 g (05/02/22 1432)     LOS: 2 days    Time spent:    Zannie Cove, MD [facility]   05/03/2022, 12:55 PM

## 2022-05-03 NOTE — Progress Notes (Signed)
Physical Therapy Treatment Patient Details Name: Billy Smith MRN: [id number] DOB: May 10, 1933 Today's Date: 05/03/2022   History of Present Illness Pt is an 88 y.o. male admitted from SNF on 05/01/22 with respiratory distress, lethargy, BLE edema. Workup for acute on chronic CHF. Of note, recent hospitalization in [facility] for PNA, HF, aflutter with d/c to SNF for rehab. Other PMH includes COPD, CAD (s/p CABG), OSA, tremors.    PT Comments    Pt received supine and willing for OOB mobility attempt with slow but steady progress towards acute goals. Pt continues to require mod assist for all bed mobility and up to min assist for transfers and gait with RW. Pt needing frequent cues to remain on task throughout session with pt perseverating on primofit tubing. Discussed importance and benefits of continued mobility with pt verbalizing understanding and expressing appreciation for PT session at end of session. Plan to continue to progress gait in further sessions. Pt continues to benefit from skilled PT services to progress toward functional mobility goals.    Recommendations for follow up therapy are one component of a multi-disciplinary discharge planning process, led by the attending physician.  Recommendations may be updated based on patient status, additional functional criteria and insurance authorization.  Follow Up Recommendations  Skilled nursing-[facility] term [facility] (<3 hours/day) Can patient physically be transported by private vehicle: Yes   Assistance Recommended at Discharge Frequent or constant Supervision/Assistance  Patient can return home with the following A lot of help with walking and/or transfers;A lot of help with bathing/dressing/bathroom;Assistance with cooking/housework;Direct supervision/assist for medications management;Direct supervision/assist for financial management;Assist for transportation;Help with stairs or ramp for entrance   Equipment Recommendations  Other  (comment) (defer to next venue)    Recommendations for Other Services       Precautions / Restrictions Precautions Precautions: Fall;Other (comment) Precaution Comments: Watch SpO2 (does not wear baseline) Restrictions Weight Bearing Restrictions: No     Mobility  Bed Mobility Overal bed mobility: Needs Assistance Bed Mobility: Supine to Sit, Sit to Supine     Supine to sit: Mod assist Sit to supine: Mod assist   General bed mobility comments: modA for HHA to elevate trunk with pt intermittently lying back needing increased cues to stay seated EOB    Transfers Overall transfer level: Needs assistance Equipment used: Rolling walker (2 wheels) Transfers: Sit to/from Stand Sit to Stand: Min assist           General transfer comment: min a to steady on rise    Ambulation/Gait Ambulation/Gait assistance: Min guard, Min assist Gait Distance (Feet): 24 Feet Assistive device: Rolling walker (2 wheels) Gait Pattern/deviations: Step-to pattern, Step-through pattern, Decreased stride length, Wide base of support, Trunk flexed Gait velocity: Decreased     General Gait Details: slow, tremulous gait with RW and close min guard for balance, intermittent minA for RW management; cues for upright posture and to maintain closer proximity to [employer]    Modified Rankin (Stroke Patients Only)       Balance Overall balance assessment: Needs assistance Sitting-balance support: Feet supported Sitting balance-Leahy Scale: Fair     Standing balance support: During functional activity, Reliant on assistive device for balance Standing balance-Leahy Scale: Poor Standing balance comment: reliant on external support  Cognition Arousal/Alertness: Awake/alert Behavior During Therapy: WFL for tasks assessed/performed Overall Cognitive Status: History of cognitive impairments - at baseline                                  General Comments: h/o dementia. pt following simple commands appropriately, requires some repetition. [facility]        Exercises      General Comments General comments (skin integrity, edema, etc.): VSS onRA      Pertinent Vitals/Pain      Home Living Family/patient expects to be discharged to:: Skilled nursing facility                   Additional Comments: prior to admission to [facility] this summer, pt was living with daughter, could be alone at house during day while she worked    Prior Function            PT Goals (current goals can now be found in the care plan section) Acute Rehab PT Goals PT Goal Formulation: With patient/family Time For Goal Achievement: 05/16/22    Frequency    Min 3X/week      PT Plan      Co-evaluation              AM-PAC PT "6 Clicks" Mobility   Outcome Measure  Help needed turning from your back to your side while in a flat bed without using bedrails?: A Lot Help needed moving from lying on your back to sitting on the side of a flat bed without using bedrails?: A Lot Help needed moving to and from a bed to a chair (including a wheelchair)?: A Lot Help needed standing up from a chair using your arms (e.g., wheelchair or bedside chair)?: A Lot Help needed to walk in hospital room?: A Lot Help needed climbing 3-5 steps with a railing? : A Lot 6 Click Score: 12    End of Session   Activity Tolerance: Patient tolerated treatment well Patient left: in bed;with call bell/phone within reach;with bed alarm set;with nursing/sitter in room Nurse Communication: Mobility status PT Visit Diagnosis: Other abnormalities of gait and mobility (R26.89)     Time: 3570-1779 PT Time Calculation (min) (ACUTE ONLY): 15 min  Charges:  $Gait Training: 8-22 mins                     Lenora Boys. PTA Acute Rehabilitation Services Office: [phone]    Catalina Antigua 05/03/2022, 5:02 PM

## 2022-05-03 NOTE — Evaluation (Signed)
Clinical/Bedside Swallow Evaluation Patient Details  Name: Billy Smith MRN: [id number] Date of Birth: Jun 25, 1933  Today's Date: 05/03/2022 Time: SLP Start Time (ACUTE ONLY): 1400 SLP Stop Time (ACUTE ONLY): 1420 SLP Time Calculation (min) (ACUTE ONLY): 20 min  Past Medical History:  Past Medical History:  Diagnosis Date   Abnormal involuntary movements(781.0)    CAD (coronary artery disease)    with 4V CABG 2004   Chronic airway obstruction, not elsewhere classified    Nephrolithiasis    Obstructive sleep apnea (adult) (pediatric)    Venous insufficiency    Past Surgical History:  Past Surgical History:  Procedure Laterality Date   CORONARY ARTERY BYPASS GRAFT  2004   HPI:  Patient is an 88 y.o. male with PMH: COPD, CAD (s/p CABG), OSA, tremors who presented to the hospital from SNF  on 05/01/22 with respiratory distress, lethargy, BLE edema and workup fro chronic CHF. He was recently seen at [facility] for CHF PNA and rapid atrial flutter and had h/o aspiration during that hospitalization. In ED at [facility], patient lethargic and hypoxic and placed on BiPAP. CXR showed possible infiltrate in left lower lobe, CT chest showed bilateral opacities, lower lobe consolidation.    Assessment / Plan / Recommendation  Clinical Impression  Patient presenting with clinical s/s of dysphagia as per this bedside/clinical swallow evaluation. He was awake and alert, receptive to drinking some water but politely declining any solids. He exhibited a suspected swallow initiation delay with all sips of thin liquids (water) but did not exhibit any overt s/s aspiration or penetration and SpO2 and RR remained WFL. Patient denies any h/o or current swallowing difficulties but when SLP spoke with patient's NT after evaluation,, she stated that she had to cut up patient's foods "very fine". SLP is recommending to downgrade patient's diet to Dys 2 (fine chop/minced) solids and continue with thin liquids. Plan to  f/u next date and determine if MBS needed to r/o aspiraiton. SLP Visit Diagnosis: Dysphagia, unspecified (R13.10)    Aspiration Risk  Mild aspiration risk    Diet Recommendation Dysphagia 2 (Fine chop);Thin liquid   Liquid Administration via: Cup;Straw Medication Administration: Whole meds with puree Supervision: Patient able to self feed;Full supervision/cueing for compensatory strategies Compensations: Minimize environmental distractions;Small sips/bites;Slow rate Postural Changes: Seated upright at 90 degrees    Other  Recommendations Oral Care Recommendations: Oral care BID    Recommendations for follow up therapy are one component of a multi-disciplinary discharge planning process, led by the attending physician.  Recommendations may be updated based on patient status, additional functional criteria and insurance authorization.  Follow up Recommendations Skilled nursing-[facility] term rehab (<3 hours/day)      Assistance Recommended at Discharge Frequent or constant Supervision/Assistance  Functional Status Assessment    Frequency and Duration min 2x/week  1 week       Prognosis Prognosis for Safe Diet Advancement: Good Barriers to Reach Goals: Cognitive deficits      Swallow Study   General Date of Onset: 05/01/22 HPI: Patient is an 88 y.o. male with PMH: COPD, CAD (s/p CABG), OSA, tremors who presented to the hospital from SNF  on 05/01/22 with respiratory distress, lethargy, BLE edema and workup fro chronic CHF. He was recently seen at [facility] for CHF PNA and rapid atrial flutter and had h/o aspiration during that hospitalization. In ED at [facility], patient lethargic and hypoxic and placed on BiPAP. CXR showed possible infiltrate in left lower lobe, CT chest showed bilateral opacities, lower  lobe consolidation. Type of Study: Bedside Swallow Evaluation Previous Swallow Assessment: very remote: MBS in 2006 Diet Prior to this Study: Regular;Thin liquids Temperature Spikes  Noted: No Respiratory Status: Room air History of Recent Intubation: No Behavior/Cognition: Alert;Cooperative;Pleasant mood Oral Cavity Assessment: Within Functional Limits Oral Care Completed by SLP: Yes Oral Cavity - Dentition: Adequate natural dentition Vision: Functional for self-feeding Self-Feeding Abilities: Able to feed self Patient Positioning: Upright in bed Baseline Vocal Quality: Normal;Other (comment) (some expiratory wheeze) Volitional Cough: Strong Volitional Swallow: Able to elicit    Oral/Motor/Sensory Function Overall Oral Motor/Sensory Function: Mild impairment Facial ROM: Reduced right Facial Symmetry: Abnormal symmetry right   Ice Chips     Thin Liquid Thin Liquid: Impaired Presentation: Straw;Self Fed    Nectar Thick     Honey Thick     Puree Puree: Not tested   Solid     Solid: Not tested      Angela Nevin, MA, CCC-SLP Speech Therapy

## 2022-05-04 DIAGNOSIS — I5033 Acute on chronic diastolic (congestive) heart failure: Secondary | ICD-10-CM | POA: Diagnosis not present

## 2022-05-04 LAB — BASIC METABOLIC PANEL
Anion gap: 10 (ref 5–15)
BUN: 20 mg/dL (ref 8–23)
CO2: 30 mmol/L (ref 22–32)
Calcium: 8.6 mg/dL — ABNORMAL LOW (ref 8.9–10.3)
Chloride: 94 mmol/L — ABNORMAL LOW (ref 98–111)
Creatinine, Ser: 1.13 mg/dL (ref 0.61–1.24)
GFR, Estimated: >60 mL/min (ref >60)
Glucose, Bld: 107 mg/dL — ABNORMAL HIGH (ref 70–99)
Potassium: 3.7 mmol/L (ref 3.5–5.1)
Sodium: 134 mmol/L — ABNORMAL LOW (ref 135–145)

## 2022-05-04 MED ORDER — IPRATROPIUM-ALBUTEROL 0.5-2.5 (3) MG/3ML IN SOLN
3.0000 mL | Freq: Two times a day (BID) | RESPIRATORY_TRACT | Status: DC
  Administered 2022-05-04 – 2022-05-09 (×11): 3 mL via RESPIRATORY_TRACT
  Filled 2022-05-04 (×11): qty 3

## 2022-05-04 MED ORDER — BISACODYL 5 MG PO TBEC
10.0000 mg | DELAYED_RELEASE_TABLET | Freq: Once | ORAL | Status: AC
  Administered 2022-05-04: 10 mg via ORAL
  Filled 2022-05-04: qty 2

## 2022-05-04 NOTE — Progress Notes (Signed)
   05/04/22 1200  Mobility  Activity Ambulated with assistance in hallway  Level of Assistance Moderate assist, patient does 50-74%  Assistive Device Front wheel walker  Distance Ambulated (ft) 25 ft  Activity Response Tolerated well  $Mobility charge 1 Mobility   Mobility Specialist Progress Note  Pt was in bed and agreeable. Mobility cut [place] d/t fatigue. Back in bed w/ all needs met and alarm on.   Lucious Groves Mobility Specialist

## 2022-05-04 NOTE — Progress Notes (Signed)
   05/04/22 1611  Mobility  Activity Transferred from chair to bed  Level of Assistance Moderate assist, patient does 50-74%  Assistive Device None  Distance Ambulated (ft) 4 ft  Activity Response Tolerated well  $Mobility charge 1 Mobility   Mobility Specialist Progress Note  Received pt in char having no complaints and agreeable to get back to chair Left in bed w/ call bell in reach and all needs met.   Lucious Groves Mobility Specialist

## 2022-05-04 NOTE — Progress Notes (Signed)
Speech Language Pathology Treatment: Dysphagia  Patient Details Name: Billy Smith MRN: [id number] DOB: [birth year] Today's Date: 05/04/2022 Time: 0347-4259 SLP Time Calculation (min) (ACUTE ONLY): 15 min  Assessment / Plan / Recommendation Clinical Impression  Patient's daughter [name] requested to speak with SLP regarding change in his diet consistency (from regular texture solids to dysphagia 2 (minced) solids). [name] reported that patient refused to eat his breakfast this morning because of texture change. SLP discussed prior hospitalization at [facility] with [facility] reporting that SLP therapy had seen patient to assess swallow function and either initiated regular solids diet or upgraded him to regular texture solids diet. Unfortunately, SLP unable to view medical records from [facility]. [name] also told SLP that she did not want to pursue a modified barium swallow study at this time because she felt he was "fine". [name] told SLP that because of patient's tremors, he has been able to eat mainly foods he can hand feed such as soft bread sandwiches, etc. SLP and daughter discussed and both in agreement that diet can be liberalized to regular texture solids with her ordering the foods that she knows he can tolerate. Patient will continue to require full supervision with meals. SLP observed patient's SpO2 at rest to be 90-91% (currently on room air). When SLP first entered the room, he was brushing his teeth and SpO2 during this activity was 80-82%, rising steadily to 90% when activity ceased.  As [name] was discussing not wanting to make changes to his medications and her hope that hospitalizations would not become "a revolving door", SLP asked her about potential interest in pursuing Palliative care. [name] was in agreement for a consult from Palliative but that she was not yet ready for making changes such as comfort care. SLP left room and spoke with patient's MD (Dr. Zannie Cove) via phone call to discuss patient  further. MD is concerned of potential aspiration and SLP had intended to determine need for MBS to r/o aspiration, but as daughter [name] is currently declining to have modified barium swallow study, this will not be completed at this time. SLP will plan to stay assigned to this patient for the time being, in the event that any changes in his POC such as agreement to have MBS (modified barium swallow study) but unable to make further clinical decisions regarding PO diet consistencies without objective swallow testing (MBS) and with daughter declining to have diet consistency modification at this time.     HPI HPI: Patient is an 88 y.o. male with PMH: COPD, CAD (s/p CABG), OSA, tremors who presented to the hospital from SNF  on 05/01/22 with respiratory distress, lethargy, BLE edema and workup fro chronic CHF. He was recently seen at [facility] for CHF PNA and rapid atrial flutter and had h/o aspiration during that hospitalization. In ED at [facility], patient lethargic and hypoxic and placed on BiPAP. CXR showed possible infiltrate in left lower lobe, CT chest showed bilateral opacities, lower lobe consolidation.      SLP Plan  Continue with current plan of care      Recommendations for follow up therapy are one component of a multi-disciplinary discharge planning process, led by the attending physician.  Recommendations may be updated based on patient status, additional functional criteria and insurance authorization.    Recommendations  Diet recommendations: Regular;Thin liquid;Other(comment) (daughter to order soft foods he can hand feed) Liquids provided via: Cup Medication Administration: Whole meds with puree Supervision: Full supervision/cueing for compensatory strategies;Staff to assist with  self feeding Compensations: Minimize environmental distractions;Small sips/bites;Slow rate Postural Changes and/or Swallow Maneuvers: Seated upright 90 degrees                Oral Care  Recommendations: Oral care BID;Staff/trained caregiver to provide oral care Follow Up Recommendations: Skilled nursing-[facility] term rehab (<3 hours/day) Assistance recommended at discharge: Frequent or constant Supervision/Assistance SLP Visit Diagnosis: Dysphagia, unspecified (R13.10) Plan: Continue with current plan of care          Angela Nevin, MA, CCC-SLP Speech Therapy

## 2022-05-04 NOTE — Progress Notes (Signed)
Rounding Note    Patient Name: Billy Smith Date of Encounter: 05/04/2022  [facility] Cardiologist: Verne Carrow, MD   Subjective   Patient seen examined his bedside. His daughter was at the bedside when I arrived.   Inpatient Medications    Scheduled Meds:  apixaban  5 mg Oral BID   aspirin EC  81 mg Oral Daily   atorvastatin  40 mg Oral QHS   brimonidine  1 drop Both Eyes BID   budesonide  2 mL Inhalation BID   cholecalciferol  5,000 Units Oral Daily   dorzolamide-timolol  1 drop Right Eye BID   empagliflozin  10 mg Oral Daily   folic acid-pyridoxine-cyancobalamin  1 tablet Oral Daily   furosemide  60 mg Intravenous BID   ipratropium-albuterol  3 mL Inhalation BID   latanoprost  1 drop Both Eyes QHS   levothyroxine  125 mcg Oral Daily   primidone  125 mg Oral QHS   And   primidone  250 mg Oral q morning   Continuous Infusions:  cefTRIAXone (ROCEPHIN)  IV 1 g (05/03/22 1636)   PRN Meds: acetaminophen **OR** acetaminophen, albuterol, ondansetron **OR** ondansetron (ZOFRAN) IV, senna   Vital Signs    Vitals:   05/03/22 1953 05/03/22 2004 05/04/22 0330 05/04/22 0717  BP: 134/77  (!) 128/55   Pulse: 77  66   Resp: 18  18   Temp: 98.2 F (36.8 C)  97.6 F (36.4 C)   TempSrc: Oral  Oral   SpO2: 93% 98% 93% 93%  Weight:   103.6 kg   Height:        Intake/Output Summary (Last 24 hours) at 05/04/2022 1010 Last data filed at 05/04/2022 1610 Gross per 24 hour  Intake 693.84 ml  Output 2200 ml  Net -1506.16 ml      05/04/2022    3:30 AM 05/02/2022   12:20 AM 05/01/2022    2:26 PM  Last 3 Weights  Weight (lbs) 228 lb 6.3 oz 232 lb 2.3 oz 223 lb 15.8 oz  Weight (kg) 103.6 kg 105.3 kg 101.6 kg      Telemetry    Sinus bradycardia- Personally Reviewed  ECG    None today- Personally Reviewed  Physical Exam   GEN: No acute distress.   Neck: No JVD Cardiac: RRR, no murmurs, rubs, or gallops.  Respiratory: Clear to auscultation  bilaterally. GI: Soft, nontender, non-distended  MS: anasarca, No deformity. Neuro:  Nonfocal  Psych: Normal affect   Labs    High Sensitivity Troponin:   Recent Labs  Lab 05/01/22 1654 05/01/22 1831  TROPONINIHS 11 11     Chemistry Recent Labs  Lab 05/01/22 1422 05/01/22 1446 05/02/22 0040 05/03/22 1024 05/04/22 0329  NA 135   < > 137 134* 134*  K 4.2   < > 5.0 3.6 3.7  CL 99   < > 97* 97* 94*  CO2 28  --  GLUCOSE 129*   < > 126* 112* 107*  BUN 18   < > CREATININE 1.24   < > 1.05 1.01 1.13  CALCIUM 8.5*  --  8.6* 8.6* 8.6*  PROT 6.4*  --   --   --   --   ALBUMIN 3.2*  --   --   --   --   AST 27  --   --   --   --   ALT 20  --   --   --   --  ALKPHOS 63  --   --   --   --   BILITOT 0.5  --   --   --   --   GFRNONAA 56*  --  >60 >60 >60  ANIONGAP 8  --  13 8 10    < > = values in this interval not displayed.    Lipids No results for input(s): "CHOL", "TRIG", "HDL", "LABVLDL", "LDLCALC", "CHOLHDL" in the last 168 hours.  Hematology Recent Labs  Lab 05/01/22 1422 05/01/22 1446 05/01/22 1813 05/01/22 2102 05/02/22 0040  WBC 7.0  --   --   --  12.3*  RBC 3.34*  --   --   --  3.39*  HGB 11.8*   < > 12.2* 11.6* 11.9*  HCT 35.2*   < > 36.0* 34.0* 35.7*  MCV 105.4*  --   --   --  105.3*  MCH 35.3*  --   --   --  35.1*  MCHC 33.5  --   --   --  33.3  RDW 14.9  --   --   --  14.7  PLT 156  --   --   --  135*   < > = values in this interval not displayed.   Thyroid No results for input(s): "TSH", "FREET4" in the last 168 hours.  BNP Recent Labs  Lab 05/01/22 1423  BNP 152.7*    DDimer No results for input(s): "DDIMER" in the last 168 hours.   Radiology    DG CHEST PORT 1 VIEW  Result Date: 05/03/2022 CLINICAL DATA:  CT no, pulmonary vascular congestion EXAM: PORTABLE CHEST 1 VIEW COMPARISON:  Radiograph 05/01/2022 FINDINGS: Unchanged cardiomediastinal silhouette with prior median sternotomy and CABG. Improvement in left lower lung  aeration in comparison to prior exam, likely improved atelectasis. Decreased interstitial opacities. Decreased left pleural effusion. No evidence of pneumothorax. Bones are unchanged. IMPRESSION: Decreased pulmonary edema and left pleural effusion with improving atelectasis in the left lung base. Electronically Signed   By: Caprice RenshawJacob  Kahn M.D.   On: 05/03/2022 08:19   ECHOCARDIOGRAM COMPLETE  Result Date: 05/02/2022    ECHOCARDIOGRAM REPORT   Patient Name:   Billy Smith Date of Exam: 05/02/2022 Medical Rec #:  [id number]     Height:       65.0 in Accession #:    [id number]    Weight:       232.1 lb Date of Birth:  01/10/1933     BSA:          2.107 m Patient Age:    89 years      BP:           105/58 mmHg Patient Gender: M             HR:           72 bpm. Exam Location:  Inpatient Procedure: 2D Echo, Cardiac Doppler and Color Doppler Indications:    CHF  History:        Patient has prior history of Echocardiogram examinations, most                 recent 03/03/2020. CAD.  Sonographer:    Eduard Rouxolleen Schwartz Referring Phys: [id number] [place] IMPRESSIONS  1. Left ventricular ejection fraction, by estimation, is 60 to 65%. The left ventricle has normal function. Left ventricular endocardial border not optimally defined to evaluate regional wall motion. There is mild concentric left ventricular hypertrophy. Left ventricular diastolic parameters are consistent  with Grade II diastolic dysfunction (pseudonormalization).  2. Right ventricular systolic function was not well visualized. The right ventricular size is not well visualized. There is mildly elevated pulmonary artery systolic pressure. The estimated right ventricular systolic pressure is 39.2 mmHg.  3. The mitral valve is grossly normal. No evidence of mitral valve regurgitation. No evidence of mitral stenosis.  4. The aortic valve is tricuspid. Aortic valve regurgitation is not visualized. Aortic valve sclerosis is present, with no evidence of aortic valve  stenosis.  5. The inferior vena cava is dilated in size with <50% respiratory variability, suggesting right atrial pressure of 15 mmHg. Comparison(s): No significant change from prior study. FINDINGS  Left Ventricle: Left ventricular ejection fraction, by estimation, is 60 to 65%. The left ventricle has normal function. Left ventricular endocardial border not optimally defined to evaluate regional wall motion. The left ventricular internal cavity size was normal in size. There is mild concentric left ventricular hypertrophy. Left ventricular diastolic parameters are consistent with Grade II diastolic dysfunction (pseudonormalization). Right Ventricle: The right ventricular size is not well visualized. Right vetricular wall thickness was not well visualized. Right ventricular systolic function was not well visualized. There is mildly elevated pulmonary artery systolic pressure. The tricuspid regurgitant velocity is 2.46 m/s, and with an assumed right atrial pressure of 15 mmHg, the estimated right ventricular systolic pressure is 39.2 mmHg. Left Atrium: Left atrial size was normal in size. Right Atrium: Right atrial size was normal in size. Pericardium: Trivial pericardial effusion is present. Presence of epicardial fat layer. Mitral Valve: The mitral valve is grossly normal. No evidence of mitral valve regurgitation. No evidence of mitral valve stenosis. Tricuspid Valve: The tricuspid valve is grossly normal. Tricuspid valve regurgitation is trivial. No evidence of tricuspid stenosis. Aortic Valve: The aortic valve is tricuspid. Aortic valve regurgitation is not visualized. Aortic valve sclerosis is present, with no evidence of aortic valve stenosis. Aortic valve peak gradient measures 4.0 mmHg. Pulmonic Valve: The pulmonic valve was grossly normal. Pulmonic valve regurgitation is not visualized. No evidence of pulmonic stenosis. Aorta: The aortic root and ascending aorta are structurally normal, with no evidence of  dilitation. Venous: The inferior vena cava is dilated in size with less than 50% respiratory variability, suggesting right atrial pressure of 15 mmHg. IAS/Shunts: The atrial septum is grossly normal.  LEFT VENTRICLE PLAX 2D LVIDd:         4.80 cm   Diastology LVIDs:         3.00 cm   LV e' medial:    5.98 cm/s LV PW:         1.30 cm   LV E/e' medial:  10.7 LV IVS:        1.20 cm   LV e' lateral:   7.42 cm/s LVOT diam:     2.10 cm   LV E/e' lateral: 8.6 LV SV:         62 LV SV Index:   30 LVOT Area:     3.46 cm  IVC IVC diam: 2.60 cm LEFT ATRIUM           Index LA diam:      3.60 cm 1.71 cm/m LA Vol (A4C): 35.6 ml 16.89 ml/m  AORTIC VALVE                 PULMONIC VALVE AV Area (Vmax): 3.51 cm     PV Vmax:       0.86 m/s AV Vmax:  99.65 cm/s   PV Peak grad:  3.0 mmHg AV Peak Grad:   4.0 mmHg LVOT Vmax:      101.00 cm/s LVOT Vmean:     53.500 cm/s LVOT VTI:       0.180 m  AORTA Ao Root diam: 3.90 cm Ao Asc diam:  3.60 cm MITRAL VALVE               TRICUSPID VALVE MV Area (PHT): 5.58 cm    TR Peak grad:   24.2 mmHg MV Decel Time: 136 msec    TR Vmax:        246.00 cm/s MV E velocity: 63.80 cm/s MV A velocity: 57.60 cm/s  SHUNTS MV E/A ratio:  1.11        Systemic VTI:  0.18 m                            Systemic Diam: 2.10 cm Lennie Odor MD Electronically signed by Lennie Odor MD Signature Date/Time: 05/02/2022/2:23:19 PM    Final     Cardiac Studies   TTE 05/02/2022 IMPRESSIONS     1. Left ventricular ejection fraction, by estimation, is 60 to 65%. The  left ventricle has normal function. Left ventricular endocardial border  not optimally defined to evaluate regional wall motion. There is mild  concentric left ventricular  hypertrophy. Left ventricular diastolic parameters are consistent with  Grade II diastolic dysfunction (pseudonormalization).   2. Right ventricular systolic function was not well visualized. The right  ventricular size is not well visualized. There is mildly elevated   pulmonary artery systolic pressure. The estimated right ventricular  systolic pressure is 39.2 mmHg.   3. The mitral valve is grossly normal. No evidence of mitral valve  regurgitation. No evidence of mitral stenosis.   4. The aortic valve is tricuspid. Aortic valve regurgitation is not  visualized. Aortic valve sclerosis is present, with no evidence of aortic  valve stenosis.   5. The inferior vena cava is dilated in size with <50% respiratory  variability, suggesting right atrial pressure of 15 mmHg.   Comparison(s): No significant change from prior study.   FINDINGS   Left Ventricle: Left ventricular ejection fraction, by estimation, is 60  to 65%. The left ventricle has normal function. Left ventricular  endocardial border not optimally defined to evaluate regional wall motion.  The left ventricular internal cavity  size was normal in size. There is mild concentric left ventricular  hypertrophy. Left ventricular diastolic parameters are consistent with  Grade II diastolic dysfunction (pseudonormalization).   Right Ventricle: The right ventricular size is not well visualized. Right  vetricular wall thickness was not well visualized. Right ventricular  systolic function was not well visualized. There is mildly elevated  pulmonary artery systolic pressure. The  tricuspid regurgitant velocity is 2.46 m/s, and with an assumed right  atrial pressure of 15 mmHg, the estimated right ventricular systolic  pressure is 39.2 mmHg.   Left Atrium: Left atrial size was normal in size.   Right Atrium: Right atrial size was normal in size.   Pericardium: Trivial pericardial effusion is present. Presence of  epicardial fat layer.   Mitral Valve: The mitral valve is grossly normal. No evidence of mitral  valve regurgitation. No evidence of mitral valve stenosis.   Tricuspid Valve: The tricuspid valve is grossly normal. Tricuspid valve  regurgitation is trivial. No evidence of tricuspid  stenosis.   Aortic Valve: The aortic  valve is tricuspid. Aortic valve regurgitation is  not visualized. Aortic valve sclerosis is present, with no evidence of  aortic valve stenosis. Aortic valve peak gradient measures 4.0 mmHg.   Pulmonic Valve: The pulmonic valve was grossly normal. Pulmonic valve  regurgitation is not visualized. No evidence of pulmonic stenosis.   Aorta: The aortic root and ascending aorta are structurally normal, with  no evidence of dilitation.   Venous: The inferior vena cava is dilated in size with less than 50%  respiratory variability, suggesting right atrial pressure of 15 mmHg.   IAS/Shunts: The atrial septum is grossly normal.    TTE 03/03/2020 IMPRESSIONS   1. Left ventricular ejection fraction, by estimation, is 50 to 55%. The left ventricle has low normal function. The left ventricle has no regional wall motion abnormalities. There is severe concentric left ventricular hypertrophy. Left ventricular  diastolic parameters are indeterminate.   2. Right ventricular systolic function is normal. The right ventricular  size is normal.   3. The mitral valve is normal in structure. No evidence of mitral valve  regurgitation. No evidence of mitral stenosis.   4. The aortic valve has an indeterminant number of cusps can not rule out  a bicuspid aortic valve. Aortic valve regurgitation is not visualized. No  aortic stenosis is present.   5. Mild aortic root calcification.   6. The inferior vena cava is normal in size with greater than 50%  respiratory variability, suggesting right atrial pressure of 3 mmHg.   FINDINGS   Left Ventricle: Left ventricular ejection fraction, by estimation, is 50  to 55%. The left ventricle has low normal function. The left ventricle has  no regional wall motion abnormalities. The left ventricular internal  cavity size was normal in size.  There is severe concentric left ventricular hypertrophy. Left ventricular  diastolic  parameters are indeterminate.   Right Ventricle: The right ventricular size is normal. No increase in  right ventricular wall thickness. Right ventricular systolic function is  normal.   Left Atrium: Left atrial size was normal in size.   Right Atrium: Right atrial size was normal in size.   Pericardium: There is no evidence of pericardial effusion.   Mitral Valve: The mitral valve is normal in structure. Normal mobility of  the mitral valve leaflets. No evidence of mitral valve regurgitation. No  evidence of mitral valve stenosis.   Tricuspid Valve: The tricuspid valve is normal in structure. Tricuspid  valve regurgitation is not demonstrated. No evidence of tricuspid  stenosis.   Aortic Valve: The aortic valve has an indeterminant number of cusps. .  There is mild thickening and mild calcification of the aortic valve.  Aortic valve regurgitation is not visualized. No aortic stenosis is  present. There is mild thickening of the  aortic valve. There is mild calcification of the aortic valve.   Pulmonic Valve: The pulmonic valve was normal in structure. Pulmonic valve  regurgitation is not visualized. No evidence of pulmonic stenosis.   Aorta: Mild aortic root calcification. The aortic root is normal in size  and structure.   Venous: The inferior vena cava is normal in size with greater than 50%  respiratory variability, suggesting right atrial pressure of 3 mmHg.   IAS/Shunts: No atrial level shunt detected by color flow Doppler  Patient Profile     88 y.o. male  with a hx of atrial flutter, chronic diastolic dysfunction CHF, COPD, obstructive sleep apnea, coronary artery disease status post 4V CABG (LIMA to LAD, SVG  to intermediate, SVG to circumflex marginal, SVG to PDA).  Assessment & Plan    Acute exacerbation of diastolic heart failure-he is improving clinically but still does have some volume.  He has had a negative balance (-1506 ml). Continue Lasix 60 mg IV twice  daily for the next 24 hours and plan to transition him to torsemide.  Please continue daily weights. Will benefit from strict I's and O, daily weights.  His echocardiogram show normal LV systolic function.  Acute respiratory failure with hypoxia-he is now off BiPAP and not on any oxygen supplements.  This could be multifactorial in the setting of his heart failure as well as his pneumonia.  He is on antibiotics for the pneumonia.  Hypertension-blood pressure acceptable continue to monitor.  Coronary artery disease-no need for any ischemic evaluation at this time.  Atrial flutter-in sinus rhythm.  Please continue his Eliquis.  Case discussed with the patient daughter and bedside nurse.  For questions or updates, please contact [facility] Please consult [email or web address] for contact info under  \    Signed, Jatoya Armbrister, DO  05/04/2022, 10:10 AM

## 2022-05-04 NOTE — Progress Notes (Signed)
PROGRESS NOTE    Billy Smith  ZOX:[id number] DOB: 1933-07-12 DOA: 05/01/2022 PCP: Paulina FusiSchultz, Douglas E, MD  89/M with history of chronic diastolic CHF, COPD, OSA, CAD/CABG, atrial flutter on Eliquis was sent to the ED from SNF for evaluation of dyspnea wheezing and hypoxia.  [facility] for [facility]F pneumonia and rapid atrial flutter, discharged to SNF for [facility]-term rehab.  On day of admission was found slumped down in the chair in respiratory distress, hypoxic O2 sats in the 70s. -In the ED lethargic and hypoxic placed on BiPAP, chest x-ray noted cardiomegaly, pulmonary vascular congestion and possible infiltrate in left lower lobe, CT chest with bilateral opacities, lower lobe consolidation   Subjective: Feels better, breathing is improving  Assessment and Plan:  Acute hypercapnic respiratory failure (HCC) -Aspiration pneumonia  -Developed sudden respiratory distress on day of admission, ABG noted uncompensated respiratory acidosis, treated with BiPAP -Improving -Antibiotics changed by Dr. Ella JubileeArrien from IV meropenem to ceftriaxone 9/13, continue same for now -SLP evaluation ongoing, D2 diet recommended, unfortunately daughter is upset and has declined MBS, I discussed importance and how silent aspiration is difficult to be noticed by family, [facility] -Multiple [facility] and [facility], palliative consulted for goals of care   Acute metabolic encephalopathy Most likely secondary to hypercarbia, avoid sedating meds At baseline patient is usually awake, alert and conversant   Acute on chronic diastolic CHF (congestive heart failure) (HCC) -[facility], 2D echo results unavailable at this time Last known LVEF was 50 to 55% from 2021 -Cards following, diuresed with Lasix gtt., initially, now on 60 Mg twice daily, he is 2.3 L  negative -Continue empagliflozin   COPD with acute exacerbation (HCC) -DuoNebs, inhaled steroids   Paroxysmal atrial flutter (HCC) -Continue apixaban Beta-blockers on hold due to bradycardia -Discussed drug interaction of apixaban and primidone with daughter yesterday and today, she is unwilling to taper his primidone on account of severe debilitating tremors for which he has been on primidone for 20 years, refuses warfarin due to overall debility and difficulties with managing INR, need for frequent lab draws, she wishes to continue Eliquis and accept its suboptimal efficacy while on primidone   CAD, CABG -Stable, no concern for ACS at this time  Essential tremors,  -Chronically on primidone for 20+ years -See discussion above regarding Eliquis  COPD (chronic obstructive pulmonary disease) (HCC) -Continue as needed nebs  Pressure injury of skin Present on admission   Active Pressure Injury/Wound(s)     Pressure Ulcer  Duration          Pressure Injury 05/02/22 Coccyx Posterior;Medial Stage 1 -  Intact skin with non-blanchable redness of a localized area usually over a bony prominence. <1 day   Pressure Injury 05/02/22 Heel Right Stage 2 -  Partial thickness loss of dermis presenting as a shallow open injury with a red, pink wound bed without slough. <1 day         DVT prophylaxis: Eliquis  Code Status: DNR Family Communication: Discussed with daughter at bedside Disposition Plan: SNF likely early next week  Consultants: Cardiology   Procedures:   Antimicrobials:    Objective: Vitals:   05/03/22 2004 05/04/22 0330 05/04/22 0717 05/04/22 1106  BP:  (!) 128/55  119/60  Pulse:  66  73  Resp:  18  20  Temp:  97.6 F (36.4 C)  97.7 F (36.5 C)  TempSrc:  Oral  Oral  SpO2: 98% 93% 93% 93%  Weight:  103.6 kg    Height:        Intake/Output Summary (Last 24 hours) at 05/04/2022 1122 Last data filed at 05/04/2022 1107 Gross per 24 hour  Intake 693.84 ml   Output 2850 ml  Net -2156.16 ml   Filed Weights   05/01/22 1426 05/02/22 0020 05/04/22 0330  Weight: 101.6 kg 105.3 kg 103.6 kg    Examination:  General exam: Chronically ill male sitting up in bed, awake alert oriented to self and place, mild cognitive deficits HEENT: No JVD CVS: S1-S2, regular rhythm Lungs: Diffuse scattered rhonchi Abdomen: Soft, obese, nontender, bowel sounds present Extremities: 1-2+ edema Skin: No rashes on exposed skin Psychiatry:  Mood & affect appropriate.     Data Reviewed:   CBC: Recent Labs  Lab 05/01/22 1422 05/01/22 1446 05/01/22 1813 05/01/22 2102 05/02/22 0040  WBC 7.0  --   --   --  12.3*  NEUTROABS 4.7  --   --   --   --   HGB 11.8* 11.9*  11.6* 12.2* 11.6* 11.9*  HCT 35.2* 35.0*  34.0* 36.0* 34.0* 35.7*  MCV 105.4*  --   --   --  105.3*  PLT 156  --   --   --  135*   Basic Metabolic Panel: Recent Labs  Lab 05/01/22 1422 05/01/22 1446 05/01/22 1813 05/01/22 2102 05/02/22 0040 05/03/22 1024 05/04/22 0329  NA 135 134*  134* 135 133* 137 134* 134*  K 4.2 4.2  4.2 4.4 4.5 5.0 3.6 3.7  CL 99 96*  --   --  97* 97* 94*  CO2 28  --   --   --  27 29 30   GLUCOSE 129* 127*  --   --  126* 112* 107*  BUN 18 18  --   --  18 21 20   CREATININE 1.24 1.20  --   --  1.05 1.01 1.13  CALCIUM 8.5*  --   --   --  8.6* 8.6* 8.6*   GFR: Estimated Creatinine Clearance: 51 mL/min (by C-G formula based on SCr of 1.13 mg/dL). Liver Function Tests: Recent Labs  Lab 05/01/22 1422  AST 27  ALT 20  ALKPHOS 63  BILITOT 0.5  PROT 6.4*  ALBUMIN 3.2*   No results for input(s): "LIPASE", "AMYLASE" in the last 168 hours. Recent Labs  Lab 05/02/22 0040  AMMONIA 34   Coagulation Profile: No results for input(s): "INR", "PROTIME" in the last 168 hours. Cardiac Enzymes: No results for input(s): "CKTOTAL", "CKMB", "CKMBINDEX", "TROPONINI" in the last 168 hours. BNP (last 3 results) No results for input(s): "PROBNP" in the last 8760  hours. HbA1C: No results for input(s): "HGBA1C" in the last 72 hours. CBG: No results for input(s): "GLUCAP" in the last 168 hours. Lipid Profile: No results for input(s): "CHOL", "HDL", "LDLCALC", "TRIG", "CHOLHDL", "LDLDIRECT" in the last 72 hours. Thyroid Function Tests: No results for input(s): "TSH", "T4TOTAL", "FREET4", "T3FREE", "THYROIDAB" in the last 72 hours. Anemia Panel: No results for input(s): "VITAMINB12", "FOLATE", "FERRITIN", "TIBC", "IRON", "RETICCTPCT" in the last 72 hours. Urine analysis: No results found for: "COLORURINE", "APPEARANCEUR", "LABSPEC", "PHURINE", "GLUCOSEU", "HGBUR", "BILIRUBINUR", "KETONESUR", "PROTEINUR", "UROBILINOGEN", "NITRITE", "LEUKOCYTESUR" Sepsis Labs: @LABRCNTIP (procalcitonin:4,lacticidven:4)  ) Recent Results (from the past 240 hour(s))  Resp Panel by RT-PCR (Flu A&B, Covid) Anterior Nasal Swab     Status: None   Collection Time: 05/01/22  9:15 PM   Specimen: Anterior Nasal Swab  Result Value Ref Range Status   SARS Coronavirus 2 by RT PCR NEGATIVE NEGATIVE Final    Comment: (NOTE) SARS-CoV-2 target nucleic acids are NOT DETECTED.  The SARS-CoV-2 RNA is generally detectable in upper respiratory specimens during the acute phase of infection. The lowest concentration of SARS-CoV-2 viral copies this assay can detect is 138 copies/mL. A negative result does not preclude SARS-Cov-2 infection and should not be used as the sole basis for treatment or other patient management decisions. A negative result may occur with  improper specimen collection/handling, submission of specimen other than nasopharyngeal swab, presence of viral mutation(s) within the areas targeted by this assay, and inadequate number of viral copies(<138 copies/mL). A negative result must be combined with clinical observations, patient history, and epidemiological information. The expected result is Negative.  Fact Sheet for Patients:   BloggerCourse.com  Fact Sheet for Healthcare Providers:  SeriousBroker.it  This test is no t yet approved or cleared by the [place] FDA and  has been authorized for detection and/or diagnosis of SARS-CoV-2 by FDA under an Emergency Use Authorization (EUA). This EUA will remain  in effect (meaning this test can be used) for the duration of the COVID-19 declaration under Section 564(b)(1) of the Act, 21 U.S.C.section 360bbb-3(b)(1), unless the authorization is terminated  or revoked sooner.       Influenza A by PCR NEGATIVE NEGATIVE Final   Influenza B by PCR NEGATIVE NEGATIVE Final    Comment: (NOTE) The Xpert Xpress SARS-CoV-2/FLU/RSV plus assay is intended as an aid in the diagnosis of influenza from Nasopharyngeal swab specimens and should not be used as a sole basis for treatment. Nasal washings and aspirates are unacceptable for Xpert Xpress SARS-CoV-2/FLU/RSV testing.  Fact Sheet for Patients: BloggerCourse.com  Fact Sheet for Healthcare Providers: SeriousBroker.it  This test is not yet approved or cleared by the [place] FDA and has been authorized for detection and/or diagnosis of SARS-CoV-2 by FDA under an Emergency Use Authorization (EUA). This EUA will remain in effect (meaning this test can be used) for the duration of the COVID-19 declaration under Section 564(b)(1) of the Act, 21 U.S.C. section 360bbb-3(b)(1), unless the authorization is terminated or revoked.  Performed at [facility], [address], [place], [place] [address]      Radiology Studies: DG CHEST PORT 1 VIEW  Result Date: 05/03/2022 CLINICAL DATA:  CT no, pulmonary vascular congestion EXAM: PORTABLE CHEST 1 VIEW COMPARISON:  Radiograph 05/01/2022 FINDINGS: Unchanged cardiomediastinal silhouette with prior median sternotomy and CABG. Improvement in left lower lung aeration in  comparison to prior exam, likely improved atelectasis. Decreased interstitial opacities. Decreased left pleural effusion. No evidence of pneumothorax. Bones are unchanged. IMPRESSION: Decreased pulmonary edema and left pleural effusion with improving atelectasis in the left lung base. Electronically Signed   By: Caprice Renshaw M.D.   On: 05/03/2022 08:19   ECHOCARDIOGRAM COMPLETE  Result Date: 05/02/2022    ECHOCARDIOGRAM REPORT   Patient Name:   Billy Smith Date of Exam: 05/02/2022 Medical Rec #:  [id number]     Height:       65.0 in Accession #:    [id number]    Weight:       232.1 lb Date of Birth:  [birth year]     BSA:          2.107 m Patient Age:    89 years      BP:           105/58 mmHg Patient  Gender: M             HR:           72 bpm. Exam Location:  Inpatient Procedure: 2D Echo, Cardiac Doppler and Color Doppler Indications:    CHF  History:        Patient has prior history of Echocardiogram examinations, most                 recent 03/03/2020. CAD.  Sonographer:    Eduard Roux Referring Phys: [id number] KARDIE TOBB IMPRESSIONS  1. Left ventricular ejection fraction, by estimation, is 60 to 65%. The left ventricle has normal function. Left ventricular endocardial border not optimally defined to evaluate regional wall motion. There is mild concentric left ventricular hypertrophy. Left ventricular diastolic parameters are consistent with Grade II diastolic dysfunction (pseudonormalization).  2. Right ventricular systolic function was not well visualized. The right ventricular size is not well visualized. There is mildly elevated pulmonary artery systolic pressure. The estimated right ventricular systolic pressure is 39.2 mmHg.  3. The mitral valve is grossly normal. No evidence of mitral valve regurgitation. No evidence of mitral stenosis.  4. The aortic valve is tricuspid. Aortic valve regurgitation is not visualized. Aortic valve sclerosis is present, with no evidence of aortic valve stenosis.  5. The  inferior vena cava is dilated in size with <50% respiratory variability, suggesting right atrial pressure of 15 mmHg. Comparison(s): No significant change from prior study. FINDINGS  Left Ventricle: Left ventricular ejection fraction, by estimation, is 60 to 65%. The left ventricle has normal function. Left ventricular endocardial border not optimally defined to evaluate regional wall motion. The left ventricular internal cavity size was normal in size. There is mild concentric left ventricular hypertrophy. Left ventricular diastolic parameters are consistent with Grade II diastolic dysfunction (pseudonormalization). Right Ventricle: The right ventricular size is not well visualized. Right vetricular wall thickness was not well visualized. Right ventricular systolic function was not well visualized. There is mildly elevated pulmonary artery systolic pressure. The tricuspid regurgitant velocity is 2.46 m/s, and with an assumed right atrial pressure of 15 mmHg, the estimated right ventricular systolic pressure is 39.2 mmHg. Left Atrium: Left atrial size was normal in size. Right Atrium: Right atrial size was normal in size. Pericardium: Trivial pericardial effusion is present. Presence of epicardial fat layer. Mitral Valve: The mitral valve is grossly normal. No evidence of mitral valve regurgitation. No evidence of mitral valve stenosis. Tricuspid Valve: The tricuspid valve is grossly normal. Tricuspid valve regurgitation is trivial. No evidence of tricuspid stenosis. Aortic Valve: The aortic valve is tricuspid. Aortic valve regurgitation is not visualized. Aortic valve sclerosis is present, with no evidence of aortic valve stenosis. Aortic valve peak gradient measures 4.0 mmHg. Pulmonic Valve: The pulmonic valve was grossly normal. Pulmonic valve regurgitation is not visualized. No evidence of pulmonic stenosis. Aorta: The aortic root and ascending aorta are structurally normal, with no evidence of dilitation.  Venous: The inferior vena cava is dilated in size with less than 50% respiratory variability, suggesting right atrial pressure of 15 mmHg. IAS/Shunts: The atrial septum is grossly normal.  LEFT VENTRICLE PLAX 2D LVIDd:         4.80 cm   Diastology LVIDs:         3.00 cm   LV e' medial:    5.98 cm/s LV PW:         1.30 cm   LV E/e' medial:  10.7 LV IVS:  1.20 cm   LV e' lateral:   7.42 cm/s LVOT diam:     2.10 cm   LV E/e' lateral: 8.6 LV SV:         62 LV SV Index:   30 LVOT Area:     3.46 cm  IVC IVC diam: 2.60 cm LEFT ATRIUM           Index LA diam:      3.60 cm 1.71 cm/m LA Vol (A4C): 35.6 ml 16.89 ml/m  AORTIC VALVE                 PULMONIC VALVE AV Area (Vmax): 3.51 cm     PV Vmax:       0.86 m/s AV Vmax:        99.65 cm/s   PV Peak grad:  3.0 mmHg AV Peak Grad:   4.0 mmHg LVOT Vmax:      101.00 cm/s LVOT Vmean:     53.500 cm/s LVOT VTI:       0.180 m  AORTA Ao Root diam: 3.90 cm Ao Asc diam:  3.60 cm MITRAL VALVE               TRICUSPID VALVE MV Area (PHT): 5.58 cm    TR Peak grad:   24.2 mmHg MV Decel Time: 136 msec    TR Vmax:        246.00 cm/s MV E velocity: 63.80 cm/s MV A velocity: 57.60 cm/s  SHUNTS MV E/A ratio:  1.11        Systemic VTI:  0.18 m                            Systemic Diam: 2.10 cm Lennie Odor MD Electronically signed by Lennie Odor MD Signature Date/Time: 05/02/2022/2:23:19 PM    Final      Scheduled Meds:  apixaban  5 mg Oral BID   aspirin EC  81 mg Oral Daily   atorvastatin  40 mg Oral QHS   brimonidine  1 drop Both Eyes BID   budesonide  2 mL Inhalation BID   cholecalciferol  5,000 Units Oral Daily   dorzolamide-timolol  1 drop Right Eye BID   empagliflozin  10 mg Oral Daily   folic acid-pyridoxine-cyancobalamin  1 tablet Oral Daily   furosemide  60 mg Intravenous BID   ipratropium-albuterol  3 mL Inhalation BID   latanoprost  1 drop Both Eyes QHS   levothyroxine  125 mcg Oral Daily   primidone  125 mg Oral QHS   And   primidone  250 mg Oral q morning    Continuous Infusions:  cefTRIAXone (ROCEPHIN)  IV 1 g (05/03/22 1636)     LOS: 3 days    Time spent:    Zannie Cove, MD [facility]   05/04/2022, 11:22 AM

## 2022-05-05 DIAGNOSIS — I5033 Acute on chronic diastolic (congestive) heart failure: Secondary | ICD-10-CM | POA: Diagnosis not present

## 2022-05-05 LAB — BASIC METABOLIC PANEL
Anion gap: 11 (ref 5–15)
BUN: 24 mg/dL — ABNORMAL HIGH (ref 8–23)
CO2: 30 mmol/L (ref 22–32)
Calcium: 8.7 mg/dL — ABNORMAL LOW (ref 8.9–10.3)
Chloride: 96 mmol/L — ABNORMAL LOW (ref 98–111)
Creatinine, Ser: 1.03 mg/dL (ref 0.61–1.24)
GFR, Estimated: >60 mL/min (ref >60)
Glucose, Bld: 94 mg/dL (ref 70–99)
Potassium: 4.1 mmol/L (ref 3.5–5.1)
Sodium: 137 mmol/L (ref 135–145)

## 2022-05-05 MED ORDER — SPIRONOLACTONE 12.5 MG HALF TABLET
12.5000 mg | ORAL_TABLET | Freq: Every day | ORAL | Status: DC
  Administered 2022-05-05 – 2022-05-09 (×5): 12.5 mg via ORAL
  Filled 2022-05-05 (×5): qty 1

## 2022-05-05 MED ORDER — PROPRANOLOL HCL 20 MG PO TABS: 20.0000 mg | ORAL_TABLET | Freq: Three times a day (TID) | ORAL | Status: DC

## 2022-05-05 MED ORDER — METOPROLOL SUCCINATE ER 25 MG PO TB24
25.0000 mg | ORAL_TABLET | Freq: Every day | ORAL | Status: DC
  Administered 2022-05-05: 25 mg via ORAL
  Filled 2022-05-05: qty 1

## 2022-05-05 NOTE — Care Management Important Message (Signed)
Important Message  Patient Details  Name: Billy Smith MRN: [id number] Date of Birth: Jan 28, 1933   Medicare Important Message Given:  Yes     Renie Ora 05/05/2022, 9:37 AM

## 2022-05-05 NOTE — Progress Notes (Signed)
PROGRESS NOTE    Billy Smith  QIH:[id number] DOB: Sep 02, 1932 DOA: 05/01/2022 PCP: Paulina Fusi, MD  89/M with history of chronic diastolic CHF, COPD, OSA, CAD/CABG, atrial flutter on Eliquis was sent to the ED from SNF for evaluation of dyspnea wheezing and hypoxia.  Recently hospitalized at [facility] for CHF pneumonia and rapid atrial flutter, discharged to SNF for [place]-term rehab.  On day of admission was found slumped down in the chair in respiratory distress, hypoxic O2 sats in the 70s. -In the ED lethargic and hypoxic placed on BiPAP, chest x-ray noted cardiomegaly, pulmonary vascular congestion and possible infiltrate in left lower lobe, CT chest with bilateral opacities, lower lobe consolidation -Improving on diuretics and ABX for aspiration pneumonia  Subjective: Feels better overall, breathing continues to improve, has some productive cough,   Assessment and Plan:  Acute hypercapnic respiratory failure (HCC) -Aspiration pneumonia  -Developed sudden respiratory distress on day of admission, ABG noted uncompensated respiratory acidosis, treated with BiPAP -Improving -Antibiotics changed by Dr. Ella Jubilee from IV meropenem to ceftriaxone 9/13, change to oral Augmentin to complete 7-day course -SLP evaluation ongoing, D2 diet recommended, unfortunately daughter is upset and has declined MBS, I discussed importance and how silent aspiration is difficult to be noticed by family, history of aspiration during recent hospitalization at [facility] health -Multiple recent admissions with CHF and pneumonia to [facility] health, palliative consulted for goals of care   Acute metabolic encephalopathy Most likely secondary to hypercarbia, avoid sedating meds At baseline patient is usually awake, alert and conversant   Acute on chronic diastolic CHF (congestive heart failure) (HCC) -Recently hospitalized at [facility], 2D echo results unavailable at this time Last known LVEF was 50  to 55% from 2021 -Cards following, diuresed with Lasix gtt., initially, now on 60 Mg twice daily, improving, he is 3.3 L negative, creatinine is stable -Continue IV Lasix at least 1 more day -Continue empagliflozin   COPD with acute exacerbation (HCC) -DuoNebs, inhaled steroids   Paroxysmal atrial flutter (HCC) -Continue apixaban Propranolol held initially for bradycardia,, metoprolol being restarted today -Discussed drug interaction of apixaban and primidone with daughter 9/13 and 9/14 she is unwilling to taper his primidone on account of severe debilitating tremors for which he has been on primidone for 20 years, refuses warfarin due to overall debility and difficulties with managing INR, need for frequent lab draws etc, she wishes to continue Eliquis and accept its suboptimal efficacy while on primidone   CAD, CABG -Stable, no concern for ACS at this time  Essential tremors,  -Chronically on primidone for 20+ years -See discussion above regarding Eliquis  COPD (chronic obstructive pulmonary disease) (HCC) -Continue as needed nebs  Pressure injury of skin Present on admission   Active Pressure Injury/Wound(s)     Pressure Ulcer  Duration          Pressure Injury 05/02/22 Coccyx Posterior;Medial Stage 1 -  Intact skin with non-blanchable redness of a localized area usually over a bony prominence. <1 day   Pressure Injury 05/02/22 Heel Right Stage 2 -  Partial thickness loss of dermis presenting as a shallow open injury with a red, pink wound bed without slough. <1 day         DVT prophylaxis: Eliquis  Code Status: DNR Family Communication: Discussed with daughter at bedside 9/13, 14 Disposition Plan: SNF likely Monday Consultants: Cardiology   Procedures:   Antimicrobials:    Objective: Vitals:   05/05/22 0411 05/05/22 0723 05/05/22 0800 05/05/22 8756  BP: 132/63   (!) 127/56  Pulse: 69  74 93  Resp: 14  12 17   Temp: 97.6 F (36.4 C)  (!) 97.4 F (36.3 C)  98.8 F (37.1 C)  TempSrc: Oral   Axillary  SpO2: 90% 91% 91% 93%  Weight: 106.2 kg     Height:        Intake/Output Summary (Last 24 hours) at 05/05/2022 1004 Last data filed at 05/05/2022 0900 Gross per 24 hour  Intake 1200 ml  Output 2101 ml  Net -901 ml   Filed Weights   05/02/22 0020 05/04/22 0330 05/05/22 0411  Weight: 105.3 kg 103.6 kg 106.2 kg    Examination:  General exam: Chronically ill elderly male sitting up in bed, awake alert oriented to self and place, mild cognitive deficits HEENT: Neck obese unable to assess JVD CVS: S1-S2, regular rhythm Lungs: Scattered rhonchi bilaterally, few expiratory wheezes Abdomen: Soft, obese, nontender, bowel sounds present Extremities: 1+ edema  Skin: No rashes on exposed skin Psychiatry: Flat affect    Data Reviewed:   CBC: Recent Labs  Lab 05/01/22 1422 05/01/22 1446 05/01/22 1813 05/01/22 2102 05/02/22 0040  WBC 7.0  --   --   --  12.3*  NEUTROABS 4.7  --   --   --   --   HGB 11.8* 11.9*  11.6* 12.2* 11.6* 11.9*  HCT 35.2* 35.0*  34.0* 36.0* 34.0* 35.7*  MCV 105.4*  --   --   --  105.3*  PLT 156  --   --   --  135*   Basic Metabolic Panel: Recent Labs  Lab 05/01/22 1422 05/01/22 1446 05/01/22 1813 05/01/22 2102 05/02/22 0040 05/03/22 1024 05/04/22 0329 05/05/22 0420  NA 135 134*  134*   < > 133* 137 134* 134* 137  K 4.2 4.2  4.2   < > 4.5 5.0 3.6 3.7 4.1  CL 99 96*  --   --  97* 97* 94* 96*  CO2 28  --   --   --  27 29 30 30   GLUCOSE 129* 127*  --   --  126* 112* 107* 94  BUN 18 18  --   --  18 21 20  24*  CREATININE 1.24 1.20  --   --  1.05 1.01 1.13 1.03  CALCIUM 8.5*  --   --   --  8.6* 8.6* 8.6* 8.7*   < > = values in this interval not displayed.   GFR: Estimated Creatinine Clearance: 56.7 mL/min (by C-G formula based on SCr of 1.03 mg/dL). Liver Function Tests: Recent Labs  Lab 05/01/22 1422  AST 27  ALT 20  ALKPHOS 63  BILITOT 0.5  PROT 6.4*  ALBUMIN 3.2*   No results for  input(s): "LIPASE", "AMYLASE" in the last 168 hours. Recent Labs  Lab 05/02/22 0040  AMMONIA 34   Coagulation Profile: No results for input(s): "INR", "PROTIME" in the last 168 hours. Cardiac Enzymes: No results for input(s): "CKTOTAL", "CKMB", "CKMBINDEX", "TROPONINI" in the last 168 hours. BNP (last 3 results) No results for input(s): "PROBNP" in the last 8760 hours. HbA1C: No results for input(s): "HGBA1C" in the last 72 hours. CBG: No results for input(s): "GLUCAP" in the last 168 hours. Lipid Profile: No results for input(s): "CHOL", "HDL", "LDLCALC", "TRIG", "CHOLHDL", "LDLDIRECT" in the last 72 hours. Thyroid Function Tests: No results for input(s): "TSH", "T4TOTAL", "FREET4", "T3FREE", "THYROIDAB" in the last 72 hours. Anemia Panel: No results for input(s): "VITAMINB12", "FOLATE", "FERRITIN", "  TIBC", "IRON", "RETICCTPCT" in the last 72 hours. Urine analysis: No results found for: "COLORURINE", "APPEARANCEUR", "LABSPEC", "PHURINE", "GLUCOSEU", "HGBUR", "BILIRUBINUR", "KETONESUR", "PROTEINUR", "UROBILINOGEN", "NITRITE", "LEUKOCYTESUR" Sepsis Labs: @LABRCNTIP (procalcitonin:4,lacticidven:4)  ) Recent Results (from the past 240 hour(s))  Resp Panel by RT-PCR (Flu A&B, Covid) Anterior Nasal Swab     Status: None   Collection Time: 05/01/22  9:15 PM   Specimen: Anterior Nasal Swab  Result Value Ref Range Status   SARS Coronavirus 2 by RT PCR NEGATIVE NEGATIVE Final    Comment: (NOTE) SARS-CoV-2 target nucleic acids are NOT DETECTED.  The SARS-CoV-2 RNA is generally detectable in upper respiratory specimens during the acute phase of infection. The lowest concentration of SARS-CoV-2 viral copies this assay can detect is 138 copies/mL. A negative result does not preclude SARS-Cov-2 infection and should not be used as the sole basis for treatment or other patient management decisions. A negative result may occur with  improper specimen collection/handling, submission of  specimen other than nasopharyngeal swab, presence of viral mutation(s) within the areas targeted by this assay, and inadequate number of viral copies(<138 copies/mL). A negative result must be combined with clinical observations, patient history, and epidemiological information. The expected result is Negative.  Fact Sheet for Patients:  07/01/22  Fact Sheet for Healthcare Providers:  BloggerCourse.com  This test is no t yet approved or cleared by the [employer]ious[employer].[place] FDA and  has been authorized for detection and/or diagnosis of SARS-CoV-2 by FDA under an Emergency Use Authorization (EUA). This EUA will remain  in effect (meaning this test can be used) for the duration of the COVID-19 declaration under Section 564(b)(1) of the Act, 21 U.S.C.section 360bbb-3(b)(1), unless the authorization is terminated  or revoked sooner.       Influenza A by PCR NEGATIVE NEGATIVE Final   Influenza B by PCR NEGATIVE NEGATIVE Final    Comment: (NOTE) The Xpert Xpress SARS-CoV-2/FLU/RSV plus assay is intended as an aid in the diagnosis of influenza from Nasopharyngeal swab specimens and should not be used as a sole basis for treatment. Nasal washings and aspirates are unacceptable for Xpert Xpress SARS-CoV-2/FLU/RSV testing.  Fact Sheet for Patients: [place]  Fact Sheet for Healthcare Providers: BloggerCourse.com  This test is not yet approved or cleared by the SeriousBroker.it FDA and has been authorized for detection and/or diagnosis of SARS-CoV-2 by FDA under an Emergency Use Authorization (EUA). This EUA will remain in effect (meaning this test can be used) for the duration of the COVID-19 declaration under Section 564(b)(1) of the Act, 21 U.S.C. section 360bbb-3(b)(1), unless the authorization is terminated or revoked.  Performed at [facility], [address]  [place], [place]      Radiology Studies: No results found.   Scheduled Meds:  apixaban  5 mg Oral BID   aspirin EC  81 mg Oral Daily   atorvastatin  40 mg Oral QHS   brimonidine  1 drop Both Eyes BID   budesonide  2 mL Inhalation BID   cholecalciferol  5,000 Units Oral Daily   dorzolamide-timolol  1 drop Right Eye BID   empagliflozin  10 mg Oral Daily   folic acid-pyridoxine-cyancobalamin  1 tablet Oral Daily   furosemide  60 mg Intravenous BID   ipratropium-albuterol  3 mL Inhalation BID   latanoprost  1 drop Both Eyes QHS   levothyroxine  125 mcg Oral Daily   metoprolol succinate  25 mg Oral Daily   primidone  125 mg Oral QHS  And   primidone  250 mg Oral q morning   spironolactone  12.5 mg Oral Daily   Continuous Infusions:  cefTRIAXone (ROCEPHIN)  IV 1 g (05/04/22 1353)     LOS: 4 days    Time spent:    Zannie Cove, MD [facility]   05/05/2022, 10:04 AM

## 2022-05-05 NOTE — Progress Notes (Addendum)
Rounding Note    Patient Name: Billy Smith Date of Encounter: 05/05/2022  [facility] Cardiologist: Verne Carrow, MD   Subjective   Patient is a limited historian with cognitive impairment. He denied any pain today, states he is breathing well, no SOB.   Inpatient Medications    Scheduled Meds:  apixaban  5 mg Oral BID   aspirin EC  81 mg Oral Daily   atorvastatin  40 mg Oral QHS   brimonidine  1 drop Both Eyes BID   budesonide  2 mL Inhalation BID   cholecalciferol  5,000 Units Oral Daily   dorzolamide-timolol  1 drop Right Eye BID   empagliflozin  10 mg Oral Daily   folic acid-pyridoxine-cyancobalamin  1 tablet Oral Daily   furosemide  60 mg Intravenous BID   ipratropium-albuterol  3 mL Inhalation BID   latanoprost  1 drop Both Eyes QHS   levothyroxine  125 mcg Oral Daily   primidone  125 mg Oral QHS   And   primidone  250 mg Oral q morning   spironolactone  12.5 mg Oral Daily   Continuous Infusions:  cefTRIAXone (ROCEPHIN)  IV 1 g (05/04/22 1353)   PRN Meds: acetaminophen **OR** acetaminophen, albuterol, ondansetron **OR** ondansetron (ZOFRAN) IV, senna   Vital Signs    Vitals:   05/04/22 2007 05/05/22 0411 05/05/22 0723 05/05/22 0800  BP:  132/63    Pulse:  69  74  Resp:  14  12  Temp:  97.6 F (36.4 C)  (!) 97.4 F (36.3 C)  TempSrc:  Oral    SpO2: 99% 90% 91% 91%  Weight:  106.2 kg    Height:        Intake/Output Summary (Last 24 hours) at 05/05/2022 0851 Last data filed at 05/05/2022 0415 Gross per 24 hour  Intake 960 ml  Output 2100 ml  Net -1140 ml      05/05/2022    4:11 AM 05/04/2022    3:30 AM 05/02/2022   12:20 AM  Last 3 Weights  Weight (lbs) 234 lb 2.1 oz 228 lb 6.3 oz 232 lb 2.3 oz  Weight (kg) 106.2 kg 103.6 kg 105.3 kg      Telemetry    In and out of A fib and sinus rhythm/tachycardia with ventricular rate of 100s over the past 24 hours- Personally Reviewed  ECG    N/A today - Personally  Reviewed  Physical Exam   GEN: No acute distress.   Neck: No JVD Cardiac: RRR, no murmurs, rubs, or gallops.  Respiratory: Scattered wheezing noted to auscultation bilaterally. On room air. GI: Soft, nontender, non-distended  MS: 1+ BLE edema; No deformity. Neuro:  Mild cognitive impairment   Psych: calm    Labs    High Sensitivity Troponin:   Recent Labs  Lab 05/01/22 1654 05/01/22 1831  TROPONINIHS 11 11     Chemistry Recent Labs  Lab 05/01/22 1422 05/01/22 1446 05/03/22 1024 05/04/22 0329 05/05/22 0420  NA 135   < > 134* 134* 137  K 4.2   < > 3.6 3.7 4.1  CL 99   < > 97* 94* 96*  CO2 28   < > 29 30 30   GLUCOSE 129*   < > 112* 107* 94  BUN 18   < > 21 20 24*  CREATININE 1.24   < > 1.01 1.13 1.03  CALCIUM 8.5*   < > 8.6* 8.6* 8.7*  PROT 6.4*  --   --   --   --  ALBUMIN 3.2*  --   --   --   --   AST 27  --   --   --   --   ALT 20  --   --   --   --   ALKPHOS 63  --   --   --   --   BILITOT 0.5  --   --   --   --   GFRNONAA 56*   < > >60 >60 >60  ANIONGAP 8   < > 8 10 11    < > = values in this interval not displayed.    Lipids No results for input(s): "CHOL", "TRIG", "HDL", "LABVLDL", "LDLCALC", "CHOLHDL" in the last 168 hours.  Hematology Recent Labs  Lab 05/01/22 1422 05/01/22 1446 05/01/22 1813 05/01/22 2102 05/02/22 0040  WBC 7.0  --   --   --  12.3*  RBC 3.34*  --   --   --  3.39*  HGB 11.8*   < > 12.2* 11.6* 11.9*  HCT 35.2*   < > 36.0* 34.0* 35.7*  MCV 105.4*  --   --   --  105.3*  MCH 35.3*  --   --   --  35.1*  MCHC 33.5  --   --   --  33.3  RDW 14.9  --   --   --  14.7  PLT 156  --   --   --  135*   < > = values in this interval not displayed.   Thyroid No results for input(s): "TSH", "FREET4" in the last 168 hours.  BNP Recent Labs  Lab 05/01/22 1423  BNP 152.7*    DDimer No results for input(s): "DDIMER" in the last 168 hours.   Radiology    No results found.  Cardiac Studies   Echo from 05/02/22:  1. Left ventricular  ejection fraction, by estimation, is 60 to 65%. The  left ventricle has normal function. Left ventricular endocardial border  not optimally defined to evaluate regional wall motion. There is mild  concentric left ventricular  hypertrophy. Left ventricular diastolic parameters are consistent with  Grade II diastolic dysfunction (pseudonormalization).   2. Right ventricular systolic function was not well visualized. The right  ventricular size is not well visualized. There is mildly elevated  pulmonary artery systolic pressure. The estimated right ventricular  systolic pressure is 39.2 mmHg.   3. The mitral valve is grossly normal. No evidence of mitral valve  regurgitation. No evidence of mitral stenosis.   4. The aortic valve is tricuspid. Aortic valve regurgitation is not  visualized. Aortic valve sclerosis is present, with no evidence of aortic  valve stenosis.   5. The inferior vena cava is dilated in size with <50% respiratory  variability, suggesting right atrial pressure of 15 mmHg.   Comparison(s): No significant change from prior study.   Patient Profile     88 y.o. male with PMH of paroxysmal atrial flutter, chronic diastolic dysfunction CHF, COPD, obstructive sleep apnea, coronary artery disease s/p 4V CABG (LIMA to LAD, SVG to intermediate, SVG to circumflex marginal, SVG to PDA), cardiology is following for acute exacerbation of diastolic heart failure.   Assessment & Plan    Acute hypoxic respiratory failure with hypercarbia - weaned to room air now , multifactorial due to acute CHF, COPD exacerbation, aspiration pneumonia   Acute on chronic diastolic heart failure  - presented with acute hypoxic respiratory failure and acute encephalopathy  - BNP 152,  POA - CXR with pulmonary vascular congestion, POA - Net  -3.3L, weight 223 >232>228>234 ibs since admission - Transitioned from lasix gtt to IV Lasix 60mg  BID on 05/03/22 (on PTA lasix 40mg  BID) , continue IV Lasix ,  remains hypervolemic today - Echo showed LVEF 60-65%, not optimally defined WMA, grade II DD - GDMT: started jardiance 10mg    CAD with hx of CABG - no chest pain - continue medical therapy with ASA, lipitor - noted home med propranolol not resumed at admission due to bradycardia, will start Metoprolol XL 25mg  daily, monitor HR if not tolerating can stop   Paroxysmal atrial flutter  - in and out of A fib and sinus rhythm/tachycardia 100s  - continue Eliquis  - stop PTA propranolol 20mg  TID today, start  start Metoprolol XL 25mg  daily   Pneumonia  COPD exacerbation Acute metabolic encephalopathy Tremor OSA Anemia - per primary team  For questions or updates, please contact [facility] Please consult [email or web address] for contact info under   Signed, 05/05/22, NP  05/05/2022, 8:51 AM   Patient seen and examined, note reviewed with the signed Advanced Practice Provider. I personally reviewed laboratory data, imaging studies and relevant notes. I independently examined the patient and formulated the important aspects of the plan. I have personally discussed the plan with the patient and/or family. Comments or changes to the note/plan are indicated below.  He is being walked when I saw the patient.  He is doing well. Okay to start his propanolol as this will help to with his beta-blockade effect as well as supporting treatment for his tremors.  Would favor using propanolol over metoprolol as the propanolol support his other chronic medical condition. I would advocate to keep him on Lasix IV for the next 24 hours, he has responded favorably to diuretics since he has been in the hospital.  But he still is not quite euvolemic. No anginal symptoms continue his aspirin and statin. He was in sinus rhythm for the last several days he is now in and out of Gethers runs of paroxysmal atrial fibrillation restarting the propanolol will help. Continue his anticoagulation.  DO, MS  [facility] Attending Cardiologist [facility]  [address] [place], 05/07/2022 Thomasene Ripple [phone] Website: [facility]

## 2022-05-05 NOTE — Progress Notes (Signed)
Physical Therapy Treatment Patient Details Name: Billy Smith MRN: [id number] DOB: 20-Apr-1933 Today's Date: 05/05/2022   History of Present Illness Pt is an 88 y.o. male admitted from SNF on 05/01/22 with respiratory distress, lethargy, BLE edema. Workup for acute on chronic CHF. Of note, recent hospitalization in [facility] for PNA, HF, aflutter with d/c to SNF for rehab. Other PMH includes COPD, CAD (s/p CABG), OSA, tremors.    PT Comments    Pt received supine and agreeable to session with encouragement. Session focused on continued transfer training and gait with RW for increased activity tolerance, with pt mobilizing with up to mod assist throughout. Pt needing assist for RW management as pt with difficulties navigating obstacles in room and with tendency to push RW too far out in front. Pt continues to be limited by fatigue and weakness, with pt declining further ambulation despite max encouragement. PT agreeable to time up in chair at end of session and receptive to education re; benefits of continued mobility and upright sitting. Pt continues to benefit from skilled PT services to progress toward functional mobility goals.    Recommendations for follow up therapy are one component of a multi-disciplinary discharge planning process, led by the attending physician.  Recommendations may be updated based on patient status, additional functional criteria and insurance authorization.  Follow Up Recommendations  Skilled nursing-[facility] term rehab (<3 hours/day) Can patient physically be transported by private vehicle: Yes   Assistance Recommended at Discharge Frequent or constant Supervision/Assistance  Patient can return home with the following A lot of help with walking and/or transfers;A lot of help with bathing/dressing/bathroom;Assistance with cooking/housework;Direct supervision/assist for medications management;Direct supervision/assist for financial management;Assist for  transportation;Help with stairs or ramp for entrance   Equipment Recommendations  Other (comment) (defer to next venue)    Recommendations for Other Services       Precautions / Restrictions Precautions Precautions: Fall;Other (comment) Precaution Comments: Watch SpO2 (does not wear baseline) Restrictions Weight Bearing Restrictions: No     Mobility  Bed Mobility Overal bed mobility: Needs Assistance Bed Mobility: Supine to Sit, Sit to Supine     Supine to sit: Mod assist     General bed mobility comments: modA with HHA to elevate trunk with pt intermittently lying back needing increased cues to stay seated EOB    Transfers Overall transfer level: Needs assistance Equipment used: Rolling walker (2 wheels) Transfers: Sit to/from Stand Sit to Stand: Min assist           General transfer comment: min a to steady on rise and for eccentric control on descent    Ambulation/Gait Ambulation/Gait assistance: Min guard, Min assist Gait Distance (Feet): 12 Feet (+ 20') Assistive device: Rolling walker (2 wheels) Gait Pattern/deviations: Step-to pattern, Step-through pattern, Decreased stride length, Wide base of support, Trunk flexed Gait velocity: Decreased     General Gait Details: slow, tremulous gait with RW and close min guard for balance, intermittent minA for RW management; cues for upright posture and to maintain closer proximity to RW, seated rest on commode, pt adamently refusing furhter ambulation   Stairs             Wheelchair Mobility    Modified Rankin (Stroke Patients Only)       Balance Overall balance assessment: Needs assistance Sitting-balance support: Feet supported Sitting balance-Leahy Scale: Fair     Standing balance support: During functional activity, Reliant on assistive device for balance Standing balance-Leahy Scale: Poor Standing balance comment: reliant on  external support                             Cognition Arousal/Alertness: Awake/alert Behavior During Therapy: WFL for tasks assessed/performed Overall Cognitive Status: History of cognitive impairments - at baseline                                 General Comments: h/o dementia. pt following simple commands appropriately, requires some repetition. HOH        Exercises      General Comments General comments (skin integrity, edema, etc.): VSS on RA      Pertinent Vitals/Pain Pain Assessment Pain Assessment: No/denies pain    Home Living                          Prior Function            PT Goals (current goals can now be found in the care plan section) Acute Rehab PT Goals PT Goal Formulation: With patient/family Time For Goal Achievement: 05/16/22    Frequency    Min 3X/week      PT Plan      Co-evaluation              AM-PAC PT "6 Clicks" Mobility   Outcome Measure  Help needed turning from your back to your side while in a flat bed without using bedrails?: A Lot Help needed moving from lying on your back to sitting on the side of a flat bed without using bedrails?: A Lot Help needed moving to and from a bed to a chair (including a wheelchair)?: A Lot Help needed standing up from a chair using your arms (e.g., wheelchair or bedside chair)?: A Lot Help needed to walk in hospital room?: A Lot Help needed climbing 3-5 steps with a railing? : A Lot 6 Click Score: 12    End of Session   Activity Tolerance: Patient tolerated treatment well;Patient limited by fatigue Patient left: with call bell/phone within reach;in chair;with chair alarm set;with family/visitor present Nurse Communication: Mobility status PT Visit Diagnosis: Other abnormalities of gait and mobility (R26.89)     Time: 1336-1400 PT Time Calculation (min) (ACUTE ONLY): 24 min  Charges:  $Gait Training: 8-22 mins $Therapeutic Activity: 8-22 mins                     Keoshia Steinmetz R. PTA Acute Rehabilitation  Services Office: [phone]    Catalina Antigua 05/05/2022, 2:11 PM

## 2022-05-05 NOTE — Progress Notes (Signed)
Mobility Specialist: Progress Note   05/05/22 1127  Mobility  Activity Ambulated with assistance in room  Level of Assistance Minimal assist, patient does 75% or more  Assistive Device Front wheel walker  Distance Ambulated (ft) 48 ft (24'x2)  Activity Response Tolerated well  $Mobility charge 1 Mobility   Pre-Mobility: 86 HR, 94% SpO2 During Mobility: 108 HR Post-Mobility: 80 HR, 118/68 (82) BP, 96% SpO2  Pt received in the bed and agreeable to mobility. Required minA for bed mobility and contact guard during ambulation. Stopped x1 for seated break secondary to fatigue. Pt back to bed after session with call bell at his side. Bed alarm is on.   [facility] Chancy Claros Mobility Specialist Mobility Specialist [address]: [phone]

## 2022-05-05 NOTE — TOC Progression Note (Signed)
Transition of Care [facility]) - Progression Note    Patient Details  Name: Billy Smith MRN: [id number] Date of Birth: [birth year]  Transition of [facility] CM/SW Contact  Erin Sons, [place] Phone Number: 05/05/2022, 10:35 AM  Clinical Narrative:     [facility] liaison informed CSW that pt's family was wanting him to move from [facility] facility to [facility] facility. Pt will discharge to [facility] at [place].   CSW called daughter [name] to confirm; no answer, left voicemail requesting return call.   Expected Discharge Plan: Skilled Nursing Facility Barriers to Discharge: Continued Medical Work up  Expected Discharge Plan and Services Expected Discharge Plan: Skilled Nursing Facility                                               Social Determinants of Health (SDOH) Interventions Food Insecurity Interventions: Intervention Not Indicated Housing Interventions: Intervention Not Indicated Transportation Interventions: Intervention Not Indicated Utilities Interventions: Intervention Not Indicated Alcohol Usage Interventions: Intervention Not Indicated (Score <7)  Readmission Risk Interventions     No data to display

## 2022-05-06 DIAGNOSIS — Z66 Do not resuscitate: Secondary | ICD-10-CM | POA: Diagnosis not present

## 2022-05-06 DIAGNOSIS — I5033 Acute on chronic diastolic (congestive) heart failure: Secondary | ICD-10-CM | POA: Diagnosis not present

## 2022-05-06 DIAGNOSIS — Z7189 Other specified counseling: Secondary | ICD-10-CM

## 2022-05-06 DIAGNOSIS — Z515 Encounter for palliative care: Secondary | ICD-10-CM

## 2022-05-06 LAB — BASIC METABOLIC PANEL
Anion gap: 10 (ref 5–15)
BUN: 27 mg/dL — ABNORMAL HIGH (ref 8–23)
CO2: 30 mmol/L (ref 22–32)
Calcium: 8.9 mg/dL (ref 8.9–10.3)
Chloride: 96 mmol/L — ABNORMAL LOW (ref 98–111)
Creatinine, Ser: 0.94 mg/dL (ref 0.61–1.24)
GFR, Estimated: >60 mL/min (ref >60)
Glucose, Bld: 101 mg/dL — ABNORMAL HIGH (ref 70–99)
Potassium: 3.9 mmol/L (ref 3.5–5.1)
Sodium: 136 mmol/L (ref 135–145)

## 2022-05-06 LAB — CBC
HCT: 32.2 % — ABNORMAL LOW (ref 39.0–52.0)
Hemoglobin: 11.3 g/dL — ABNORMAL LOW (ref 13.0–17.0)
MCH: 35.5 pg — ABNORMAL HIGH (ref 26.0–34.0)
MCHC: 35.1 g/dL (ref 30.0–36.0)
MCV: 101.3 fL — ABNORMAL HIGH (ref 80.0–100.0)
Platelets: 181 10*3/uL (ref 150–400)
RBC: 3.18 MIL/uL — ABNORMAL LOW (ref 4.22–5.81)
RDW: 14.8 % (ref 11.5–15.5)
WBC: 6.8 10*3/uL (ref 4.0–10.5)
nRBC: 0 % (ref 0.0–0.2)

## 2022-05-06 MED ORDER — PROPRANOLOL HCL 20 MG PO TABS
20.0000 mg | ORAL_TABLET | Freq: Three times a day (TID) | ORAL | Status: DC
  Administered 2022-05-06 – 2022-05-09 (×10): 20 mg via ORAL
  Filled 2022-05-06 (×12): qty 1

## 2022-05-06 NOTE — Progress Notes (Signed)
Rounding Note    Patient Name: Billy Smith Date of Encounter: 05/06/2022  [facility] Cardiologist: Verne Carrow, MD   Subjective   Is a limited historian.  Does not endorse shortness of breath.  Net -4.4 L since admission.  Inpatient Medications    Scheduled Meds:  apixaban  5 mg Oral BID   aspirin EC  81 mg Oral Daily   atorvastatin  40 mg Oral QHS   brimonidine  1 drop Both Eyes BID   budesonide  2 mL Inhalation BID   cholecalciferol  5,000 Units Oral Daily   dorzolamide-timolol  1 drop Right Eye BID   empagliflozin  10 mg Oral Daily   folic acid-pyridoxine-cyancobalamin  1 tablet Oral Daily   furosemide  60 mg Intravenous BID   ipratropium-albuterol  3 mL Inhalation BID   latanoprost  1 drop Both Eyes QHS   levothyroxine  125 mcg Oral Daily   primidone  125 mg Oral QHS   And   primidone  250 mg Oral q morning   propranolol  20 mg Oral TID   spironolactone  12.5 mg Oral Daily   Continuous Infusions:   PRN Meds: acetaminophen **OR** acetaminophen, albuterol, ondansetron **OR** ondansetron (ZOFRAN) IV, senna   Vital Signs    Vitals:   05/05/22 2105 05/06/22 0339 05/06/22 0719 05/06/22 0800  BP:  (!) 116/57  130/63  Pulse:  65  69  Resp:  18    Temp:  98.2 F (36.8 C)    TempSrc:  Oral    SpO2: 94% 91% 95%   Weight:  102.9 kg    Height:        Intake/Output Summary (Last 24 hours) at 05/06/2022 1200 Last data filed at 05/06/2022 0340 Gross per 24 hour  Intake 840 ml  Output 2150 ml  Net -1310 ml       05/06/2022    3:39 AM 05/05/2022    4:11 AM 05/04/2022    3:30 AM  Last 3 Weights  Weight (lbs) 226 lb 13.7 oz 234 lb 2.1 oz 228 lb 6.3 oz  Weight (kg) 102.9 kg 106.2 kg 103.6 kg      Telemetry    Sinus rhythm-personally reviewed  ECG    None new  Physical Exam   GEN: Well nourished, well developed, in no acute distress  HEENT: normal  Neck: no JVD, carotid bruits, or masses Cardiac: RRR; no murmurs, rubs, or  gallops,no edema  Respiratory:  clear to auscultation bilaterally, normal work of breathing GI: soft, nontender, nondistended, + BS MS: no deformity or atrophy  Skin: warm and dry Neuro:  Strength and sensation are intact Psych: euthymic mood, full affect   Labs    High Sensitivity Troponin:   Recent Labs  Lab 05/01/22 1654 05/01/22 1831  TROPONINIHS 11 11      Chemistry Recent Labs  Lab 05/01/22 1422 05/01/22 1446 05/04/22 0329 05/05/22 0420 05/06/22 0346  NA 135   < > 134* 137 136  K 4.2   < > 3.7 4.1 3.9  CL 99   < > 94* 96* 96*  CO2 28   < > 30 30 30   GLUCOSE 129*   < > 107* 94 101*  BUN 18   < > 20 24* 27*  CREATININE 1.24   < > 1.13 1.03 0.94  CALCIUM 8.5*   < > 8.6* 8.7* 8.9  PROT 6.4*  --   --   --   --  ALBUMIN 3.2*  --   --   --   --   AST 27  --   --   --   --   ALT 20  --   --   --   --   ALKPHOS 63  --   --   --   --   BILITOT 0.5  --   --   --   --   GFRNONAA 56*   < > >60 >60 >60  ANIONGAP 8   < > 10 11 10    < > = values in this interval not displayed.     Lipids No results for input(s): "CHOL", "TRIG", "HDL", "LABVLDL", "LDLCALC", "CHOLHDL" in the last 168 hours.  Hematology Recent Labs  Lab 05/01/22 1422 05/01/22 1446 05/01/22 2102 05/02/22 0040 05/06/22 0346  WBC 7.0  --   --  12.3* 6.8  RBC 3.34*  --   --  3.39* 3.18*  HGB 11.8*   < > 11.6* 11.9* 11.3*  HCT 35.2*   < > 34.0* 35.7* 32.2*  MCV 105.4*  --   --  105.3* 101.3*  MCH 35.3*  --   --  35.1* 35.5*  MCHC 33.5  --   --  33.3 35.1  RDW 14.9  --   --  14.7 14.8  PLT 156  --   --  135* 181   < > = values in this interval not displayed.    Thyroid No results for input(s): "TSH", "FREET4" in the last 168 hours.  BNP Recent Labs  Lab 05/01/22 1423  BNP 152.7*     DDimer No results for input(s): "DDIMER" in the last 168 hours.   Radiology    No results found.  Cardiac Studies   Echo from 05/02/22:  1. Left ventricular ejection fraction, by estimation, is 60 to 65%.  The  left ventricle has normal function. Left ventricular endocardial border  not optimally defined to evaluate regional wall motion. There is mild  concentric left ventricular  hypertrophy. Left ventricular diastolic parameters are consistent with  Grade II diastolic dysfunction (pseudonormalization).   2. Right ventricular systolic function was not well visualized. The right  ventricular size is not well visualized. There is mildly elevated  pulmonary artery systolic pressure. The estimated right ventricular  systolic pressure is 38.2 mmHg.   3. The mitral valve is grossly normal. No evidence of mitral valve  regurgitation. No evidence of mitral stenosis.   4. The aortic valve is tricuspid. Aortic valve regurgitation is not  visualized. Aortic valve sclerosis is present, with no evidence of aortic  valve stenosis.   5. The inferior vena cava is dilated in size with <50% respiratory  variability, suggesting right atrial pressure of 15 mmHg.   Comparison(s): No significant change from prior study.   Patient Profile     88 y.o. male with PMH of paroxysmal atrial flutter, chronic diastolic dysfunction CHF, COPD, obstructive sleep apnea, coronary artery disease s/p 4V CABG (LIMA to LAD, SVG to intermediate, SVG to circumflex marginal, SVG to PDA), cardiology is following for acute exacerbation of diastolic heart failure.   Assessment & Plan    1.  Acute hypoxic respiratory failure: Has been weaned to room air.  Likely multifactorial due to CHF and COPD exacerbation as well as aspiration pneumonia.  2.  Acute on chronic diastolic heart failure: Net -4.4 L.  At this point, no evidence of volume overload.  We Jamiah Recore transition tomorrow to p.o. diuresis.  Plan is for discharge to SNF Monday.  3.  Coronary artery disease: Status post CABG.  No current chest pain.  Continue current management.  4.  Paroxysmal atrial flutter: Has been in and out of arrhythmias.  Currently on Eliquis.  Would  continue with current management.  Cardiology to see as needed tomorrow transition to p.o. Lasix as above.  - per primary team  For questions or updates, please contact [facility] Please consult [email or web address] for contact info under   Signed, Reah Justo Jorja Loa, MD  05/06/2022, 12:00 PM

## 2022-05-06 NOTE — Progress Notes (Signed)
PROGRESS NOTE    Billy Smith  [id number] DOB: Apr 22, 1933 DOA: 05/01/2022 PCP: Nicoletta Dress, MD  89/M with history of chronic diastolic CHF, COPD, OSA, CAD/CABG, atrial flutter on Eliquis was sent to the ED from SNF for evaluation of dyspnea wheezing and hypoxia.  Recently hospitalized at [facility] for CHF pneumonia and rapid atrial flutter, discharged to SNF for [place]-term rehab.  On day of admission was found slumped down in the chair in respiratory distress, hypoxic O2 sats in the 70s. -In the ED lethargic and hypoxic placed on BiPAP, chest x-ray noted cardiomegaly, pulmonary vascular congestion and possible infiltrate in left lower lobe, CT chest with bilateral opacities, lower lobe consolidation -Improving on diuretics and ABX for aspiration pneumonia  Subjective: Feels better overall, breathing continues to improve, has some productive cough,   Assessment and Plan:  Acute hypercapnic respiratory failure (HCC) -Aspiration pneumonia  -Developed sudden respiratory distress on day of admission, ABG noted uncompensated respiratory acidosis, treated with BiPAP -Improving -Treated with IV meropenem followed by ceftriaxone, antibiotics completed -SLP evaluation ongoing, D2 diet recommended, unfortunately daughter was upset and has declined MBS, I discussed importance and how silent aspiration is difficult to be noticed by family, history of aspiration during recent hospitalization at [facility] -Multiple recent admissions with CHF and pneumonia to [facility] health, palliative consulted for goals of care -PT OT following, plan for discharge back to SNF on Monday   Acute metabolic encephalopathy Most likely secondary to hypercarbia, avoid sedating meds At baseline patient is usually awake, alert and conversant   Acute on chronic diastolic CHF (congestive heart failure) ([place]) -Recently hospitalized at [facility], 2D echo results unavailable at this time Last known  LVEF was 50 to 55% from 2021 -Cards following, diuresed with Lasix gtt., initially, now on 60 Mg twice daily, improving, 4.4 L negative, creatinine is stable -Appears close to euvolemic, could continue IV Lasix for 1 more day -Continue empagliflozin   COPD with acute exacerbation (HCC) -DuoNebs, inhaled steroids   Paroxysmal atrial flutter (HCC) -Continue apixaban Propranolol held initially for bradycardia, now resumed -Discussed drug interaction of apixaban and primidone with daughter 9/13 and 9/14 she is unwilling to taper his primidone on account of severe debilitating tremors for which he has been on primidone for 20 years, refuses warfarin due to overall debility and difficulties with managing INR, need for frequent lab draws etc, she wishes to continue Eliquis and accept its suboptimal efficacy while on primidone   CAD, CABG -Stable, no concern for ACS at this time  Essential tremors,  -Chronically on primidone for 20+ years, propranolol resumed as well -See discussion above regarding Eliquis  COPD (chronic obstructive pulmonary disease) ([place]) -Continue as needed nebs  Pressure injury of skin Present on admission   Active Pressure Injury/Wound(s)     Pressure Ulcer  Duration          Pressure Injury 05/02/22 Coccyx Posterior;Medial Stage 1 -  Intact skin with non-blanchable redness of a localized area usually over a bony prominence. <1 day   Pressure Injury 05/02/22 Heel Right Stage 2 -  Partial thickness loss of dermis presenting as a shallow open injury with a red, pink wound bed without slough. <1 day         DVT prophylaxis: Eliquis  Code Status: DNR Family Communication: Discussed with daughter at bedside 9/13, 14 Disposition Plan: SNF likely Monday Consultants: Cardiology   Procedures:   Antimicrobials:    Objective: Vitals:   05/05/22 2105 05/06/22 [id number]  05/06/22 0719 05/06/22 0800  BP:  (!) 116/57  130/63  Pulse:  65  69  Resp:  18    Temp:  98.2  F (36.8 C)    TempSrc:  Oral    SpO2: 94% 91% 95%   Weight:  102.9 kg    Height:        Intake/Output Summary (Last 24 hours) at 05/06/2022 1037 Last data filed at 05/06/2022 0340 Gross per 24 hour  Intake 840 ml  Output 2150 ml  Net -1310 ml   Filed Weights   05/04/22 0330 05/05/22 0411 05/06/22 0339  Weight: 103.6 kg 106.2 kg 102.9 kg    Examination:  General exam: Low and chronically ill elderly male sitting up in bed, awake alert oriented to self and place, mild cognitive deficits HEENT: Neck obese unable to assess JVD CVS: S1-S2, regular rhythm Lungs: Few scattered rhonchi, rare basilar rales Abdomen: Soft, obese, nontender, bowel sounds present Extremities: Trace edema  Skin: No rashes on exposed skin Psychiatry: Flat affect    Data Reviewed:   CBC: Recent Labs  Lab 05/01/22 1422 05/01/22 1446 05/01/22 1813 05/01/22 2102 05/02/22 0040 05/06/22 0346  WBC 7.0  --   --   --  12.3* 6.8  NEUTROABS 4.7  --   --   --   --   --   HGB 11.8* 11.9*  11.6* 12.2* 11.6* 11.9* 11.3*  HCT 35.2* 35.0*  34.0* 36.0* 34.0* 35.7* 32.2*  MCV 105.4*  --   --   --  105.3* 101.3*  PLT 156  --   --   --  135* 034   Basic Metabolic Panel: Recent Labs  Lab 05/02/22 0040 05/03/22 1024 05/04/22 0329 05/05/22 0420 05/06/22 0346  NA 137 134* 134* 137 136  K 5.0 3.6 3.7 4.1 3.9  CL 97* 97* 94* 96* 96*  CO2 27 29 30 30 30   GLUCOSE 126* 112* 107* 94 101*  BUN 18 21 20  24* 27*  CREATININE 1.05 1.01 1.13 1.03 0.94  CALCIUM 8.6* 8.6* 8.6* 8.7* 8.9   GFR: Estimated Creatinine Clearance: 61.2 mL/min (by C-G formula based on SCr of 0.94 mg/dL). Liver Function Tests: Recent Labs  Lab 05/01/22 1422  AST 27  ALT 20  ALKPHOS 63  BILITOT 0.5  PROT 6.4*  ALBUMIN 3.2*   No results for input(s): "LIPASE", "AMYLASE" in the last 168 hours. Recent Labs  Lab 05/02/22 0040  AMMONIA 34   Coagulation Profile: No results for input(s): "INR", "PROTIME" in the last 168  hours. Cardiac Enzymes: No results for input(s): "CKTOTAL", "CKMB", "CKMBINDEX", "TROPONINI" in the last 168 hours. BNP (last 3 results) No results for input(s): "PROBNP" in the last 8760 hours. HbA1C: No results for input(s): "HGBA1C" in the last 72 hours. CBG: No results for input(s): "GLUCAP" in the last 168 hours. Lipid Profile: No results for input(s): "CHOL", "HDL", "LDLCALC", "TRIG", "CHOLHDL", "LDLDIRECT" in the last 72 hours. Thyroid Function Tests: No results for input(s): "TSH", "T4TOTAL", "FREET4", "T3FREE", "THYROIDAB" in the last 72 hours. Anemia Panel: No results for input(s): "VITAMINB12", "FOLATE", "FERRITIN", "TIBC", "IRON", "RETICCTPCT" in the last 72 hours. Urine analysis: No results found for: "COLORURINE", "APPEARANCEUR", "LABSPEC", "PHURINE", "GLUCOSEU", "HGBUR", "BILIRUBINUR", "KETONESUR", "PROTEINUR", "UROBILINOGEN", "NITRITE", "LEUKOCYTESUR" Sepsis Labs: @LABRCNTIP (procalcitonin:4,lacticidven:4)  ) Recent Results (from the past 240 hour(s))  Resp Panel by RT-PCR (Flu A&B, Covid) Anterior Nasal Swab     Status: None   Collection Time: 05/01/22  9:15 PM   Specimen: Anterior Nasal Swab  Result Value Ref Range Status   SARS Coronavirus 2 by RT PCR NEGATIVE NEGATIVE Final    Comment: (NOTE) SARS-CoV-2 target nucleic acids are NOT DETECTED.  The SARS-CoV-2 RNA is generally detectable in upper respiratory specimens during the acute phase of infection. The lowest concentration of SARS-CoV-2 viral copies this assay can detect is 138 copies/mL. A negative result does not preclude SARS-Cov-2 infection and should not be used as the sole basis for treatment or other patient management decisions. A negative result may occur with  improper specimen collection/handling, submission of specimen other than nasopharyngeal swab, presence of viral mutation(s) within the areas targeted by this assay, and inadequate number of viral copies(<138 copies/mL). A negative result  must be combined with clinical observations, patient history, and epidemiological information. The expected result is Negative.  Fact Sheet for Patients:  EntrepreneurPulse.com.au  Fact Sheet for Healthcare Providers:  IncredibleEmployment.be  This test is no t yet approved or cleared by the [place] FDA and  has been authorized for detection and/or diagnosis of SARS-CoV-2 by FDA under an Emergency Use Authorization (EUA). This EUA will remain  in effect (meaning this test can be used) for the duration of the COVID-19 declaration under Section 564(b)(1) of the Act, 21 U.S.C.section 360bbb-3(b)(1), unless the authorization is terminated  or revoked sooner.       Influenza A by PCR NEGATIVE NEGATIVE Final   Influenza B by PCR NEGATIVE NEGATIVE Final    Comment: (NOTE) The Xpert Xpress SARS-CoV-2/FLU/RSV plus assay is intended as an aid in the diagnosis of influenza from Nasopharyngeal swab specimens and should not be used as a sole basis for treatment. Nasal washings and aspirates are unacceptable for Xpert Xpress SARS-CoV-2/FLU/RSV testing.  Fact Sheet for Patients: EntrepreneurPulse.com.au  Fact Sheet for Healthcare Providers: IncredibleEmployment.be  This test is not yet approved or cleared by the [place] FDA and has been authorized for detection and/or diagnosis of SARS-CoV-2 by FDA under an Emergency Use Authorization (EUA). This EUA will remain in effect (meaning this test can be used) for the duration of the COVID-19 declaration under Section 564(b)(1) of the Act, 21 U.S.C. section 360bbb-3(b)(1), unless the authorization is terminated or revoked.  Performed at [facility], [place] [address] [place], [place] [address]      Radiology Studies: No results found.   Scheduled Meds:  apixaban  5 mg Oral BID   aspirin EC  81 mg Oral Daily   atorvastatin  40 mg Oral QHS    brimonidine  1 drop Both Eyes BID   budesonide  2 mL Inhalation BID   cholecalciferol  5,000 Units Oral Daily   dorzolamide-timolol  1 drop Right Eye BID   empagliflozin  10 mg Oral Daily   folic acid-pyridoxine-cyancobalamin  1 tablet Oral Daily   furosemide  60 mg Intravenous BID   ipratropium-albuterol  3 mL Inhalation BID   latanoprost  1 drop Both Eyes QHS   levothyroxine  125 mcg Oral Daily   primidone  125 mg Oral QHS   And   primidone  250 mg Oral q morning   propranolol  20 mg Oral TID   spironolactone  12.5 mg Oral Daily   Continuous Infusions:     LOS: 5 days    Time spent: 17min    Domenic Polite, MD [facility]   05/06/2022, 10:37 AM

## 2022-05-06 NOTE — Consult Note (Signed)
Palliative Medicine Inpatient Consult Note  Consulting Provider: Domenic Polite, MD  Reason for consult:   [place] Palliative Medicine Consult  Reason for Consult? Goals of care   05/06/2022  HPI:  Per intake H&P --> Billy Smith with history of chronic diastolic CHF, COPD, OSA, CAD/CABG, atrial flutter on Eliquis was sent to the ED from SNF for evaluation of dyspnea wheezing and hypoxia.  Recently hospitalized at [facility] for CHF pneumonia and rapid atrial flutter, discharged to SNF for [facility]-term rehab.  On day of admission was found slumped down in the chair in respiratory distress, hypoxic O2 sats in the 70s. Has received antibiotics and diuresis with some improvement. Palliative care has been asked to get involved to further address goals of care in the setting of multiple co-morbid conditions and recurrent re-hospitalizations.   Clinical Assessment/Goals of Care:  *Please note that this is a verbal dictation therefore any spelling or grammatical errors are due to the "[place] One" system interpretation.  I have reviewed medical records including EPIC notes, labs and imaging, received report from bedside RN, assessed the patient.    I called patients daughter, Billy Smith to further discuss diagnosis prognosis, [place], EOL wishes, disposition and options.   I introduced Palliative Medicine as specialized medical care for people living with serious illness. It focuses on providing relief from the symptoms and stress of a serious illness. The goal is to improve quality of life for both the patient and the family.  Medical History Review and Understanding:  A discussion of Billy Smith's history of COPD, coronary artery disease, atrial fibrillation/flutter, and diastolic heart failure was held.  Social History:  Billy Smith is from [place], [place].  He is a widower as his wife, Billy Smith passed away 2 years ago.  Patient shares that his wife's passing was fairly  devastating.  Billy Smith has 1 daughter, Billy Smith and no grandchildren.  He formally worked as a Set designer.  He got great enjoyment out of fishing and being outdoors.  He is a man of faith and practices within the [employer] denomination.  Functional and Nutritional State:  [place] [facility] prior to [facility] he was working with rehabilitation to get closer to doing his B ADLs independently.  Prior to that he had been living in his home and his daughter Billy Smith was his primary caregiver helping with things such as food preparation and medication management.  Patient's baseline functional state was being able to walk back and forth from his bedroom to the bathroom.  He did have a fairly good appetite without any recent weight loss.  Advance Directives:  A detailed discussion was had today regarding advanced directives.  Patient's daughter, Billy Smith is his primary Media planner.   Code Status:  [place] is an established DNAR/DNI CODE STATUS.  Discussion:  Prior to rehospitalization Billy Smith was at [facility] expresses that while there she had an open and honest conversation with the physician who expressed that she would not be surprised if [name] was readmitted to the hospital within 30 days or less.  Apparently the conversation was that this physician was quite detailed in the setting of patient's chronic comorbid conditions and his likelihood of possible continued decline.  Family expresses that she is not surprised that that physician was correct.  Family at this time is struggling with where to draw a line in terms of how far we should go regarding Gwynn's health care.  She vocalized various clinical scenarios whereby Billy Smith comes back  and forth to the hospital and heart failure or COPD exacerbation and we are able to provide treatment though she shares that we are not fixing the underlying issue.  We discussed that for the time being is not unreasonable for Billy Smith to go back to  [facility] rehabilitation.  We reviewed that if Billy Smith is rehospitalized again in a Reid period of time hospice is more than appropriate consideration. I described hospice as a service for patients who have a life expectancy of 6 months or less. The goal of hospice is the preservation of dignity and quality at the end phases of life. Under hospice care, the focus changes from curative to symptom relief.   I shared that outpatient palliative support would be able to follow along with [facility] and hopefully continue with these important conversations pertaining to goals of care.  Discussed the importance of continued conversation with family and their  medical providers regarding overall plan of care and treatment options, ensuring decisions are within the context of the patients values and GOCs.  Decision Maker: Billy Smith,Billy Smith (Daughter): [phone] (Home Phone)  SUMMARY OF RECOMMENDATIONS   DNAR/DNI  For the time being full scope of medical care  Open and honest conversations were held regarding best case and worst-case scenarios  Discussed patient's high risk of readmission in a [place] period of time  Patient's daughter, Billy Smith shares if patient is readmitted she is more open to the idea of hospice care  though expresses she wants to give her father "a chance"  Explained the differences between palliative care and hospice care  Appreciate TOC arranging outpatient palliative support on discharge  Ongoing palliative support  Code Status/Advance Care Planning: DNAR/DNI   Palliative Prophylaxis:  Aspiration, Bowel Regimen, Delirium Protocol, Frequent Pain Assessment, Oral Care, Palliative Wound Care, and Turn Reposition  Additional Recommendations (Limitations, Scope, Preferences): New current care  Psycho-social/Spiritual:  Desire for further Chaplaincy support: Not presently Additional Recommendations: Education on chronic disease trajectory   Prognosis: Patient with multiple chronic  comorbidities and recurrent readmissions places him at a high 55-month mortality risk  Discharge Planning: Discharge to [facility] with outpatient palliative support  Vitals:   05/05/22 2105 05/06/22 0339  BP:  (!) 116/57  Pulse:  65  Resp:  18  Temp:  98.2 F (36.8 C)  SpO2: 94% 91%    Intake/Output Summary (Last 24 hours) at 05/06/2022 6283 Last data filed at 05/06/2022 0340 Gross per 24 hour  Intake 1080 ml  Output 2151 ml  Net -1071 ml   Last Weight  Most recent update: 05/06/2022  3:59 AM    Weight  102.9 kg (226 lb 13.7 oz)            Gen: Elderly Caucasian male in no acute distress HEENT: moist mucous membranes CV: Regular rate and irregular rhythm PULM: On room air breathing is even and nonlabored ABD: soft/nontender EXT: Generalized edema Neuro: Alert and oriented x2-3 -very hard of hearing  PPS: 40%   This conversation/these recommendations were discussed with patient primary care team, Dr. Jomarie Longs  Billing based on MDM: High  Problems Addressed: One acute or chronic illness or injury that poses a threat to life or bodily function  Amount and/or Complexity of Data: Category 3:Discussion of management or test interpretation with external physician/other qualified health care professional/appropriate source (not separately reported)  Risks: Decision not to resuscitate or to de-escalate care because of poor prognosis ______________________________________________________ Lamarr Lulas [facility] Palliative Medicine Team Team Cell Phone: [phone] Please utilize secure  chat with additional questions, if there is no response within 30 minutes please call the above phone number  Palliative Medicine Team providers are available by phone from 7am to 7pm daily and can be reached through the team cell phone.  Should this patient require assistance outside of these hours, please call the patient's attending physician.

## 2022-05-06 NOTE — TOC Progression Note (Addendum)
Transition of Care [facility]) - Progression Note    Patient Details  Name: BENZ VANDENBERGHE MRN: [id number] Date of Birth: 01/07/1933  Transition of Care [facility]) CM/SW [place], LCSW Phone Number: 05/06/2022, 12:48 PM  Clinical Narrative:     CSW spoke with pt's daughter plan due to her wanting to speak with CSW. [name] explained that the plan is going to [facility]. CSW had attempted to call Olivia Mackie at [facility] however had to leave message. CSW was informed by Palliative NP that family may want to pursue palliative  once at the facility.[name] explained that she wants to pursue full scope of care and to ascertain how pt does first before pursuing palliative. [name] let CSW know that she has begun the process of applying for Medicaid. CSW let [name] know if she hears back from the facility this weekend she will reach back out.  CSW received a return call from [place] and she confirmed the plan for pt to be discharge to [facility].  TOC team will continue to assist with discharge planning needs.   Expected Discharge Plan: Skilled Nursing Facility Barriers to Discharge: Continued Medical Work up  Expected Discharge Plan and Services Expected Discharge Plan: [place]                                               Social Determinants of Health (SDOH) Interventions Food Insecurity Interventions: Intervention Not Indicated Housing Interventions: Intervention Not Indicated Transportation Interventions: Intervention Not Indicated Utilities Interventions: Intervention Not Indicated Alcohol Usage Interventions: Intervention Not Indicated (Score <7)  Readmission Risk Interventions     No data to display

## 2022-05-06 NOTE — Progress Notes (Signed)
No BIPAP needed at this time. RT will monitor.

## 2022-05-07 DIAGNOSIS — Z515 Encounter for palliative care: Secondary | ICD-10-CM | POA: Diagnosis not present

## 2022-05-07 DIAGNOSIS — I5033 Acute on chronic diastolic (congestive) heart failure: Secondary | ICD-10-CM | POA: Diagnosis not present

## 2022-05-07 LAB — BASIC METABOLIC PANEL
Anion gap: 11 (ref 5–15)
BUN: 29 mg/dL — ABNORMAL HIGH (ref 8–23)
CO2: 29 mmol/L (ref 22–32)
Calcium: 8.6 mg/dL — ABNORMAL LOW (ref 8.9–10.3)
Chloride: 94 mmol/L — ABNORMAL LOW (ref 98–111)
Creatinine, Ser: 1.05 mg/dL (ref 0.61–1.24)
GFR, Estimated: >60 mL/min (ref >60)
Glucose, Bld: 96 mg/dL (ref 70–99)
Potassium: 4.4 mmol/L (ref 3.5–5.1)
Sodium: 134 mmol/L — ABNORMAL LOW (ref 135–145)

## 2022-05-07 MED ORDER — FUROSEMIDE 40 MG PO TABS
40.0000 mg | ORAL_TABLET | Freq: Two times a day (BID) | ORAL | Status: DC
  Administered 2022-05-07 (×2): 40 mg via ORAL
  Filled 2022-05-07 (×3): qty 1

## 2022-05-07 NOTE — Plan of Care (Signed)
  Problem: Clinical Measurements: Goal: Ability to maintain clinical measurements within normal limits will improve Outcome: Not Progressing   Problem: Clinical Measurements: Goal: Will remain free from infection Outcome: Not Progressing   Problem: Nutrition: Goal: Adequate nutrition will be maintained Outcome: Not Progressing   Problem: Safety: Goal: Ability to remain free from injury will improve Outcome: Not Progressing

## 2022-05-07 NOTE — Progress Notes (Signed)
Palliative Medicine Inpatient Follow Up Note HPI: 89/M with history of chronic diastolic CHF, COPD, OSA, CAD/CABG, atrial flutter on Eliquis was sent to the ED from SNF for evaluation of dyspnea wheezing and hypoxia.  Recently hospitalized at [facility] for CHF pneumonia and rapid atrial flutter, discharged to SNF for [facility]-term rehab.  On day of admission was found slumped down in the chair in respiratory distress, hypoxic O2 sats in the 70s. Has received antibiotics and diuresis with some improvement. Palliative care has been asked to get involved to further address goals of care in the setting of multiple co-morbid conditions and recurrent re-hospitalizations.   Today's Discussion 05/07/2022  *Please note that this is a verbal dictation therefore any spelling or grammatical errors are due to the "[place] One" system interpretation.  Chart reviewed inclusive of vital signs, progress notes, laboratory results, and diagnostic images.   I met with Billy Smith this morning. He was resting in bed and preparing to eat his breakfast. He was not in any pain at my time of assessment nor was he nauseated. He was noted to have an audible wheezing though is going to get a breathing treatment - he does not endorse feeling Dockery of breath.  I spoke to patients daughter, Billy Smith this afternoon. She expresses that she knows she will likely need to go towards comfort care/hospice if Billy Smith gets readmitted again in a Me[place] period of time. She would like to give him the chance to see if improvements want be made in the [place] term. She is hopeful be will be able to go to [facility] in [place] as opposed to their other location.  [name] states that she would like OP Palliative support though she wants to investigate this on her own as she feels [facility] may have this service.   Questions and concerns addressed/Palliative Support Provided.   Objective Assessment: Vital Signs Vitals:   05/07/22 1020 05/07/22 1200  BP:  124/75 115/65  Pulse: 77 64  Resp:  17  Temp: 97.6 F (36.4 C)   SpO2:  91%    Intake/Output Summary (Last 24 hours) at 05/07/2022 1252 Last data filed at 05/07/2022 0040 Gross per 24 hour  Intake 820 ml  Output 800 ml  Net 20 ml   Last Weight  Most recent update: 05/07/2022  5:22 AM    Weight  103 kg (227 lb 1.2 oz)            Gen: Elderly Caucasian male in no acute distress HEENT: moist mucous membranes CV: Regular rate and irregular rhythm PULM: On room air breathing is even and nonlabored ABD: soft/nontender EXT: Generalized edema Neuro: Alert and oriented x2-3 -very hard of hearing  SUMMARY OF RECOMMENDATIONS   DNAR/DNI   For the time being full scope of medical care   Patients daughter aware of possible readmission --> vocalizes awareness of wanting to likely travel down the road of comfort/hospice if that were to occur  TOC --> Daughter would like patient back at [facility] in [place]  Patients daughter would like to investigate OP Palliative care on her own to determine if she would like this   Plan for transition to SNF when a bed is available  Palliative care will remain peripherally involved  Billing based on MDM: High _____________________________________________________________________________________ [place] Team Team Cell Phone: [phone] Please utilize secure chat with additional questions, if there is no response within 30 minutes please call the above phone number  Palliative Medicine Team  providers are available by phone from 7am to 7pm daily and can be reached through the team cell phone.  Should this patient require assistance outside of these hours, please call the patient's attending physician.

## 2022-05-07 NOTE — Progress Notes (Signed)
PROGRESS NOTE    Billy Smith  LGX:[id number] DOB: 04-Nov-1932 DOA: 05/01/2022 PCP: Paulina Fusi, MD  89/M with history of chronic diastolic CHF, COPD, OSA, CAD/CABG, atrial flutter on Eliquis was sent to the ED from SNF for evaluation of dyspnea wheezing and hypoxia.  Recently hospitalized at [facility] for CHF pneumonia and rapid atrial flutter, discharged to SNF for [facility]-term rehab.  On day of admission was found slumped down in the chair in respiratory distress, hypoxic O2 sats in the 70s. -In the ED lethargic and hypoxic placed on BiPAP, chest x-ray noted cardiomegaly, pulmonary vascular congestion and possible infiltrate in left lower lobe, CT chest with bilateral opacities, lower lobe consolidation -Improving on diuretics and ABX for aspiration pneumonia  Subjective: -Feels better overall, breathing is improving, tells me he is ready to see his wife in heaven  Assessment and Plan:  Acute hypercapnic respiratory failure (HCC) -Aspiration pneumonia  -Developed sudden respiratory distress on day of admission, ABG noted uncompensated respiratory acidosis, treated with BiPAP -Improving -Treated with IV meropenem then ceftriaxone, Abx completed -SLP evaluation ongoing, D2 diet recommended, unfortunately daughter was upset and has declined MBS, I discussed importance and how silent aspiration is difficult to be noticed by family, history of aspiration during recent hospitalization at [facility] health -Multiple recent admissions with CHF and pneumonia to [facility], palliative consulted for goals of care -PT OT following, plan for discharge back to SNF on Monday   Acute metabolic encephalopathy Most likely secondary to hypercarbia, avoid sedating meds At baseline patient is usually awake, alert and conversant   Acute on chronic diastolic CHF (congestive heart failure) (HCC) -Recently hospitalized at [facility], 2D echo results unavailable at this time Last known  LVEF was 50 to 55% from 2021 -Cards following, diuresed with Lasix gtt., initially, now on 60 Mg twice daily, improving, 4.4 L negative, creatinine is stable -Appears euvolemic, transition to oral Lasix today,  -Continue empagliflozin, Aldactone   COPD with acute exacerbation (HCC) -DuoNebs, inhaled steroids -Improved   Paroxysmal atrial flutter (HCC) -Continue apixaban Propranolol held initially for bradycardia, now resumed -Discussed drug interaction of apixaban and primidone with daughter 9/13 and 9/14 she is unwilling to taper his primidone on account of severe debilitating tremors for which he has been on primidone for 20 years, refuses warfarin due to overall debility and difficulties with managing INR, need for frequent lab draws etc, she wishes to continue Eliquis and accept its suboptimal efficacy while on primidone   CAD, CABG -Stable, no concern for ACS at this time  Essential tremors,  -Chronically on primidone for 20+ years, propranolol resumed as well -See discussion above regarding Eliquis  COPD (chronic obstructive pulmonary disease) (HCC) -Continue as needed nebs  Pressure injury of skin Present on admission   Active Pressure Injury/Wound(s)     Pressure Ulcer  Duration          Pressure Injury 05/02/22 Coccyx Posterior;Medial Stage 1 -  Intact skin with non-blanchable redness of a localized area usually over a bony prominence. <1 day   Pressure Injury 05/02/22 Heel Right Stage 2 -  Partial thickness loss of dermis presenting as a shallow open injury with a red, pink wound bed without slough. <1 day         DVT prophylaxis: Eliquis  Code Status: DNR Family Communication: Discussed with daughter at bedside 9/13, 14 Disposition Plan: SNF tomorrow with palliative care follow-up Consultants: Cardiology   Procedures:   Antimicrobials:    Objective: Vitals:  05/07/22 0400 05/07/22 0401 05/07/22 0409 05/07/22 0500  BP: (!) 119/49     Pulse: 62      Resp: 17     Temp:   98.1 F (36.7 C)   TempSrc:      SpO2: (!) 87% (!) 87% 98%   Weight:    103 kg  Height:        Intake/Output Summary (Last 24 hours) at 05/07/2022 0941 Last data filed at 05/07/2022 0040 Gross per 24 hour  Intake 820 ml  Output 800 ml  Net 20 ml   Filed Weights   05/06/22 0339 05/07/22 0000 05/07/22 0500  Weight: 102.9 kg 103 kg 103 kg    Examination:  General exam: Obese chronically ill elderly male sitting up in bed, AAO x2, mild cognitive deficits HEENT: Obese unable to assess JVD CVS: S1-S2, regular rhythm Lungs: Decreased breath sounds the bases, clear clear anteriorly Abdomen: Soft, obese, nontender, bowel sounds present Extremities: No edema  Skin: No rashes on exposed skin Psychiatry: Flat affect    Data Reviewed:   CBC: Recent Labs  Lab 05/01/22 1422 05/01/22 1446 05/01/22 1813 05/01/22 2102 05/02/22 0040 05/06/22 0346  WBC 7.0  --   --   --  12.3* 6.8  NEUTROABS 4.7  --   --   --   --   --   HGB 11.8* 11.9*  11.6* 12.2* 11.6* 11.9* 11.3*  HCT 35.2* 35.0*  34.0* 36.0* 34.0* 35.7* 32.2*  MCV 105.4*  --   --   --  105.3* 101.3*  PLT 156  --   --   --  135* 993   Basic Metabolic Panel: Recent Labs  Lab 05/03/22 1024 05/04/22 0329 05/05/22 0420 05/06/22 0346 05/07/22 0239  NA 134* 134* 137 136 134*  K 3.6 3.7 4.1 3.9 4.4  CL 97* 94* 96* 96* 94*  CO2 29 30 30 30 29   GLUCOSE 112* 107* 94 101* 96  BUN 21 20 24* 27* 29*  CREATININE 1.01 1.13 1.03 0.94 1.05  CALCIUM 8.6* 8.6* 8.7* 8.9 8.6*   GFR: Estimated Creatinine Clearance: 54.8 mL/min (by C-G formula based on SCr of 1.05 mg/dL). Liver Function Tests: Recent Labs  Lab 05/01/22 1422  AST 27  ALT 20  ALKPHOS 63  BILITOT 0.5  PROT 6.4*  ALBUMIN 3.2*   No results for input(s): "LIPASE", "AMYLASE" in the last 168 hours. Recent Labs  Lab 05/02/22 0040  AMMONIA 34   Coagulation Profile: No results for input(s): "INR", "PROTIME" in the last 168 hours. Cardiac  Enzymes: No results for input(s): "CKTOTAL", "CKMB", "CKMBINDEX", "TROPONINI" in the last 168 hours. BNP (last 3 results) No results for input(s): "PROBNP" in the last 8760 hours. HbA1C: No results for input(s): "HGBA1C" in the last 72 hours. CBG: No results for input(s): "GLUCAP" in the last 168 hours. Lipid Profile: No results for input(s): "CHOL", "HDL", "LDLCALC", "TRIG", "CHOLHDL", "LDLDIRECT" in the last 72 hours. Thyroid Function Tests: No results for input(s): "TSH", "T4TOTAL", "FREET4", "T3FREE", "THYROIDAB" in the last 72 hours. Anemia Panel: No results for input(s): "VITAMINB12", "FOLATE", "FERRITIN", "TIBC", "IRON", "RETICCTPCT" in the last 72 hours. Urine analysis: No results found for: "COLORURINE", "APPEARANCEUR", "LABSPEC", "PHURINE", "GLUCOSEU", "HGBUR", "BILIRUBINUR", "KETONESUR", "PROTEINUR", "UROBILINOGEN", "NITRITE", "LEUKOCYTESUR" Sepsis Labs: @LABRCNTIP (procalcitonin:4,lacticidven:4)  ) Recent Results (from the past 240 hour(s))  Resp Panel by RT-PCR (Flu A&B, Covid) Anterior Nasal Swab     Status: None   Collection Time: 05/01/22  9:15 PM   Specimen: Anterior Nasal  Swab  Result Value Ref Range Status   SARS Coronavirus 2 by RT PCR NEGATIVE NEGATIVE Final    Comment: (NOTE) SARS-CoV-2 target nucleic acids are NOT DETECTED.  The SARS-CoV-2 RNA is generally detectable in upper respiratory specimens during the acute phase of infection. The lowest concentration of SARS-CoV-2 viral copies this assay can detect is 138 copies/mL. A negative result does not preclude SARS-Cov-2 infection and should not be used as the sole basis for treatment or other patient management decisions. A negative result may occur with  improper specimen collection/handling, submission of specimen other than nasopharyngeal swab, presence of viral mutation(s) within the areas targeted by this assay, and inadequate number of viral copies(<138 copies/mL). A negative result must be combined  with clinical observations, patient history, and epidemiological information. The expected result is Negative.  Fact Sheet for Patients:  BloggerCourse.com  Fact Sheet for Healthcare Providers:  SeriousBroker.it  This test is no t yet approved or cleared by the [place] FDA and  has been authorized for detection and/or diagnosis of SARS-CoV-2 by FDA under an Emergency Use Authorization (EUA). This EUA will remain  in effect (meaning this test can be used) for the duration of the COVID-19 declaration under Section 564(b)(1) of the Act, 21 U.S.C.section 360bbb-3(b)(1), unless the authorization is terminated  or revoked sooner.       Influenza A by PCR NEGATIVE NEGATIVE Final   Influenza B by PCR NEGATIVE NEGATIVE Final    Comment: (NOTE) The Xpert Xpress SARS-CoV-2/FLU/RSV plus assay is intended as an aid in the diagnosis of influenza from Nasopharyngeal swab specimens and should not be used as a sole basis for treatment. Nasal washings and aspirates are unacceptable for Xpert Xpress SARS-CoV-2/FLU/RSV testing.  Fact Sheet for Patients: BloggerCourse.com  Fact Sheet for Healthcare Providers: SeriousBroker.it  This test is not yet approved or cleared by the [place] FDA and has been authorized for detection and/or diagnosis of SARS-CoV-2 by FDA under an Emergency Use Authorization (EUA). This EUA will remain in effect (meaning this test can be used) for the duration of the COVID-19 declaration under Section 564(b)(1) of the Act, 21 U.S.C. section 360bbb-3(b)(1), unless the authorization is terminated or revoked.  Performed at [facility], [address], [place], [place] [address]      Radiology Studies: No results found.   Scheduled Meds:  apixaban  5 mg Oral BID   aspirin EC  81 mg Oral Daily   atorvastatin  40 mg Oral QHS   brimonidine  1 drop Both  Eyes BID   budesonide  2 mL Inhalation BID   cholecalciferol  5,000 Units Oral Daily   dorzolamide-timolol  1 drop Right Eye BID   empagliflozin  10 mg Oral Daily   folic acid-pyridoxine-cyancobalamin  1 tablet Oral Daily   furosemide  40 mg Oral BID   ipratropium-albuterol  3 mL Inhalation BID   latanoprost  1 drop Both Eyes QHS   levothyroxine  125 mcg Oral Daily   primidone  125 mg Oral QHS   And   primidone  250 mg Oral q morning   propranolol  20 mg Oral TID   spironolactone  12.5 mg Oral Daily   Continuous Infusions:     LOS: 6 days    Time spent:    Zannie Cove, MD [facility]   05/07/2022, 9:41 AM

## 2022-05-08 DIAGNOSIS — I5033 Acute on chronic diastolic (congestive) heart failure: Secondary | ICD-10-CM | POA: Diagnosis not present

## 2022-05-08 MED ORDER — TORSEMIDE 20 MG PO TABS
40.0000 mg | ORAL_TABLET | Freq: Every day | ORAL | Status: DC
  Administered 2022-05-08 – 2022-05-09 (×2): 40 mg via ORAL
  Filled 2022-05-08 (×2): qty 2

## 2022-05-08 MED ORDER — IPRATROPIUM-ALBUTEROL 0.5-2.5 (3) MG/3ML IN SOLN
3.0000 mL | RESPIRATORY_TRACT | Status: AC
  Administered 2022-05-08: 3 mL via RESPIRATORY_TRACT
  Filled 2022-05-08: qty 3

## 2022-05-08 NOTE — Progress Notes (Signed)
SLP Cancellation Note  Patient Details Name: Billy Smith MRN: [id number] DOB: 1932-11-21   Cancelled treatment:       Reason Eval/Treat Not Completed: Other (comment) (Brief check-in with patient and daughter regarding swallowing. Per daughter, patient seems to be swallowing well,changes in POC. Patient might discharge next date and daughter is hopeful that he can be maintained at current state.) SLP to s/o at this time.   Sonia Baller, MA, CCC-SLP Speech Therapy

## 2022-05-08 NOTE — Progress Notes (Signed)
Occupational Therapy Treatment Patient Details Name: Billy Smith MRN: [id number] DOB: 12-16-1932 Today's Date: 05/08/2022   History of present illness Pt is an 88 y.o. male admitted from SNF on 05/01/22 with respiratory distress, lethargy, BLE edema. Workup for acute on chronic CHF. Of note, recent hospitalization in [facility] for PNA, HF, aflutter with d/c to SNF for rehab. Other PMH includes COPD, CAD (s/p CABG), OSA, tremors.   OT comments  Patient's daughter asked therapist to assist with getting patient back to bed. Patient up in recliner and asked to perform walk before going back to bed. Patient was min assist to stand from recliner and min assist to ambulate to door and to EOB with frequent cues for staying close to walker and safety. Patient required cues for hand placement to sit and was able to get to supine with min guard assist and min assist to assist with positioning in bed. Acute OT to continue to follow.    Recommendations for follow up therapy are one component of a multi-disciplinary discharge planning process, led by the attending physician.  Recommendations may be updated based on patient status, additional functional criteria and insurance authorization.    Follow Up Recommendations  Skilled nursing-[facility] term rehab (<3 hours/day)    Assistance Recommended at Discharge Frequent or constant Supervision/Assistance  Patient can return home with the following  A little help with walking and/or transfers;A lot of help with bathing/dressing/bathroom;Assistance with cooking/housework;Direct supervision/assist for medications management;Direct supervision/assist for financial management;Assist for transportation;Help with stairs or ramp for entrance   Equipment Recommendations  None recommended by OT (defer)    Recommendations for Other Services      Precautions / Restrictions Precautions Precautions: Fall;Other (comment) Precaution Comments: Watch SpO2 (does not  wear baseline) Restrictions Weight Bearing Restrictions: No       Mobility Bed Mobility Overal bed mobility: Needs Assistance Bed Mobility: Sit to Supine     Supine to sit: Mod assist Sit to supine: Min assist   General bed mobility comments: patient able to get to supine from sitting EOB but required assitance to correct positioning in bed    Transfers Overall transfer level: Needs assistance Equipment used: Rolling walker (2 wheels) Transfers: Sit to/from Stand, Bed to chair/wheelchair/BSC Sit to Stand: Min assist     Step pivot transfers: Min assist     General transfer comment: frequent cues to stay close to walker and assistance with guiding walker     Balance Overall balance assessment: Needs assistance Sitting-balance support: Feet supported Sitting balance-Leahy Scale: Fair     Standing balance support: During functional activity, Reliant on assistive device for balance Standing balance-Leahy Scale: Poor Standing balance comment: reliant on RW for support                           ADL either performed or assessed with clinical judgement   ADL Overall ADL's : Needs assistance/impaired                                       General ADL Comments: focused onreturning to bed    Extremity/Trunk Assessment              Vision       Perception     Praxis      Cognition Arousal/Alertness: Awake/alert Behavior During Therapy: WFL for tasks assessed/performed Overall Cognitive Status:  History of cognitive impairments - at baseline Area of Impairment: Awareness, Safety/judgement, Following commands, Memory                     Memory: Decreased [place]-term memory Following Commands: Follows one step commands with increased time Safety/Judgement: Decreased awareness of safety Awareness: Emergent   General Comments: very talkative, spoke of past        Exercises      Shoulder Instructions       General  Comments      Pertinent Vitals/ Pain       Pain Assessment Pain Assessment: No/denies pain  Home Living                                          Prior Functioning/Environment              Frequency  Min 2X/week        Progress Toward Goals  OT Goals(current goals can now be found in the care plan section)  Progress towards OT goals: Progressing toward goals  Acute Rehab OT Goals Patient Stated Goal: get better OT Goal Formulation: With patient Time For Goal Achievement: 05/17/22 Potential to Achieve Goals: Good ADL Goals Pt Will Perform Grooming: with min guard assist;standing Pt Will Perform Upper Body Dressing: with min guard assist;sitting Pt Will Perform Lower Body Dressing: with min assist;sit to/from stand;sitting/lateral leans Pt Will Transfer to Toilet: with modified independence;regular height toilet;ambulating Pt Will Perform Tub/Shower Transfer: Shower transfer;Tub transfer;with min assist;rolling walker;ambulating Additional ADL Goal #1: pt will perform bed mobility with min guard A in prep for ADLs  Plan Discharge plan remains appropriate    Co-evaluation                 AM-PAC OT "6 Clicks" Daily Activity     Outcome Measure   Help from another person eating meals?: None Help from another person taking care of personal grooming?: A Little Help from another person toileting, which includes using toliet, bedpan, or urinal?: A Lot Help from another person bathing (including washing, rinsing, drying)?: A Lot Help from another person to put on and taking off regular upper body clothing?: A Lot Help from another person to put on and taking off regular lower body clothing?: A Lot 6 Click Score: 15    End of Session Equipment Utilized During Treatment: Rolling walker (2 wheels)  OT Visit Diagnosis: Unsteadiness on feet (R26.81);Muscle weakness (generalized) (M62.81);Other abnormalities of gait and mobility (R26.89)   Activity  Tolerance Patient tolerated treatment well   Patient Left in bed;with call bell/phone within reach;with bed alarm set;with family/visitor present   Nurse Communication Mobility status        Time: 1420-1440 OT Time Calculation (min): 20 min  Charges: OT General Charges $OT Visit: 1 Visit OT Treatments $Therapeutic Activity: 8-22 mins  Lodema Hong, [place]  Office [place] 05/08/2022, 2:50 PM

## 2022-05-08 NOTE — Progress Notes (Signed)
Occupational Therapy Treatment Patient Details Name: Billy Smith MRN: [id number] DOB: 1933-04-25 Today's Date: 05/08/2022   History of present illness Pt is an 88 y.o. male admitted from SNF on 05/01/22 with respiratory distress, lethargy, BLE edema. Workup for acute on chronic CHF. Of note, recent hospitalization in [facility] for PNA, HF, aflutter with d/c to SNF for rehab. Other PMH includes COPD, CAD (s/p CABG), OSA, tremors.   OT comments  Patient received in supine with daughter helpful to encourage patient to participate. Patient required mod assist for supine to sitting on EOB with HOB raised and assistance with trunk.  Patient able to stand from EOB with min assist and transfer to recliner. Patient performed grooming seated in recliner due to fatigue.  Static standing performed with min assist to min guard to stand and patient tolerated 1 minute and 45 seconds of standing with reaching tasks to challenge balance.  Discharge recommendation continues to be appropriate. Acute OT to continue to follow.    Recommendations for follow up therapy are one component of a multi-disciplinary discharge planning process, led by the attending physician.  Recommendations may be updated based on patient status, additional functional criteria and insurance authorization.    Follow Up Recommendations  Skilled nursing-[facility] term rehab (<3 hours/day)    Assistance Recommended at Discharge Frequent or constant Supervision/Assistance  Patient can return home with the following  A little help with walking and/or transfers;A lot of help with bathing/dressing/bathroom;Assistance with cooking/housework;Direct supervision/assist for medications management;Direct supervision/assist for financial management;Assist for transportation;Help with stairs or ramp for entrance   Equipment Recommendations  None recommended by OT (defer)    Recommendations for Other Services      Precautions / Restrictions  Precautions Precautions: Fall;Other (comment) Precaution Comments: Watch SpO2 (does not wear baseline) Restrictions Weight Bearing Restrictions: No       Mobility Bed Mobility Overal bed mobility: Needs Assistance Bed Mobility: Supine to Sit     Supine to sit: Mod assist     General bed mobility comments: cues for technique and assistance to elevate trunk, increased time    Transfers Overall transfer level: Needs assistance Equipment used: Rolling walker (2 wheels) Transfers: Sit to/from Stand, Bed to chair/wheelchair/BSC Sit to Stand: Min assist     Step pivot transfers: Min assist     General transfer comment: min assist to steady and to guide RW, cues for hand placement and safety     Balance Overall balance assessment: Needs assistance Sitting-balance support: Feet supported Sitting balance-Leahy Scale: Fair     Standing balance support: During functional activity, Reliant on assistive device for balance Standing balance-Leahy Scale: Poor Standing balance comment: able to perform reaching tasks when addressing standing tolerance with patient tolerating 1 minute and 45 seconds of standing                           ADL either performed or assessed with clinical judgement   ADL Overall ADL's : Needs assistance/impaired     Grooming: Wash/dry hands;Wash/dry face;Oral care;Brushing hair;Set up;Sitting Grooming Details (indicate cue type and reason): performed seated in recliner                               General ADL Comments: grooming performed seated due to patient complaints of fatigue    Extremity/Trunk Assessment              Vision  Perception     Praxis      Cognition Arousal/Alertness: Awake/alert Behavior During Therapy: WFL for tasks assessed/performed Overall Cognitive Status: History of cognitive impairments - at baseline Area of Impairment: Awareness, Safety/judgement, Following commands, Memory                      Memory: Decreased [place]-term memory Following Commands: Follows one step commands with increased time Safety/Judgement: Decreased awareness of safety Awareness: Emergent   General Comments: h/o dementia. Able to follow directions, requires motivation from daughter        Exercises      Shoulder Instructions       General Comments      Pertinent Vitals/ Pain       Pain Assessment Pain Assessment: No/denies pain  Home Living                                          Prior Functioning/Environment              Frequency  Min 2X/week        Progress Toward Goals  OT Goals(current goals can now be found in the care plan section)  Progress towards OT goals: Progressing toward goals  Acute Rehab OT Goals Patient Stated Goal: get better OT Goal Formulation: With patient Time For Goal Achievement: 05/17/22 Potential to Achieve Goals: Good ADL Goals Pt Will Perform Grooming: with min guard assist;standing Pt Will Perform Upper Body Dressing: with min guard assist;sitting Pt Will Perform Lower Body Dressing: with min assist;sit to/from stand;sitting/lateral leans Pt Will Transfer to Toilet: with modified independence;regular height toilet;ambulating Pt Will Perform Tub/Shower Transfer: Shower transfer;Tub transfer;with min assist;rolling walker;ambulating Additional ADL Goal #1: pt will perform bed mobility with min guard A in prep for ADLs  Plan Discharge plan remains appropriate    Co-evaluation                 AM-PAC OT "6 Clicks" Daily Activity     Outcome Measure   Help from another person eating meals?: None Help from another person taking care of personal grooming?: A Little Help from another person toileting, which includes using toliet, bedpan, or urinal?: A Lot Help from another person bathing (including washing, rinsing, drying)?: A Lot Help from another person to put on and taking off regular upper  body clothing?: A Lot Help from another person to put on and taking off regular lower body clothing?: A Lot 6 Click Score: 15    End of Session Equipment Utilized During Treatment: Rolling walker (2 wheels)  OT Visit Diagnosis: Unsteadiness on feet (R26.81);Muscle weakness (generalized) (M62.81);Other abnormalities of gait and mobility (R26.89)   Activity Tolerance Patient tolerated treatment well   Patient Left in chair;with call bell/phone within reach;with chair alarm set;with family/visitor present   Nurse Communication Mobility status        Time: 0829-0900 OT Time Calculation (min): 31 min  Charges: OT General Charges $OT Visit: 1 Visit OT Treatments $Self Care/Home Management : 8-22 mins $Therapeutic Activity: 8-22 mins  Alfonse Flavors, OTA Acute Rehabilitation Services  Office [phone]   Dewain Penning 05/08/2022, 10:11 AM

## 2022-05-08 NOTE — Progress Notes (Addendum)
PROGRESS NOTE    Billy SCHINKE  ZOX:[id number] DOB: 02-28-1933 DOA: 05/01/2022 PCP: Paulina Fusi, MD  89/M with history of chronic diastolic CHF, COPD, OSA, CAD/CABG, atrial flutter on Eliquis was sent to the ED from SNF for evaluation of dyspnea wheezing and hypoxia.  Recently hospitalized at [facility] for CHF pneumonia and rapid atrial flutter, discharged to SNF for [facility]-term rehab.  On day of admission was found slumped down in the chair in respiratory distress, hypoxic O2 sats in the 70s. -In the ED lethargic and hypoxic placed on BiPAP, chest x-ray noted cardiomegaly, pulmonary vascular congestion and possible infiltrate in left lower lobe, CT chest with bilateral opacities, lower lobe consolidation -Improving on diuretics and ABX for aspiration pneumonia  Subjective: -The patient was seen and examined with his daughter at his bedside.  He has no new complaints.  Awaiting SNF placement, pending insurance authorization.  Assessment and Plan:  Resolved acute hypercapnic respiratory failure (HCC) -Treated aspiration pneumonia  -Developed sudden respiratory distress on day of admission, ABG noted uncompensated respiratory acidosis, treated with BiPAP-now on room air. -Treated with IV meropenem then ceftriaxone, Abx completed -SLP evaluation ongoing, D2 diet recommended, unfortunately daughter was upset and has declined MBS, I discussed importance and how silent aspiration is difficult to be noticed by family, history of aspiration during recent hospitalization at [facility] health -Multiple recent admissions with CHF and pneumonia to [facility] health, palliative consulted for goals of care -PT OT following, plan for discharge back to SNF on Monday   Resolved acute metabolic encephalopathy Most likely secondary to hypercarbia, avoid sedating meds At baseline patient is usually awake, alert and conversant He is currently back at his baseline mentation   Acute on chronic diastolic  CHF (congestive heart failure) (HCC) -Recently hospitalized at [facility], 2D echo results unavailable at this time Last known LVEF was 50 to 55% from 2021 -Cards following, diuresed with Lasix gtt., initially, then on 60 Mg twice daily, now on 40 mg torsemide.  4.1 L negative, creatinine is stable -Continue empagliflozin, Aldactone   COPD with acute exacerbation (HCC) -DuoNebs, inhaled steroids -Improved   Paroxysmal atrial flutter (HCC) -Continue apixaban for CVA prevention Propranolol held initially for bradycardia, now resumed -Discussed drug interaction of apixaban and primidone with daughter 9/13 and 9/14 she is unwilling to taper his primidone on account of severe debilitating tremors for which he has been on primidone for 20 years, refuses warfarin due to overall debility and difficulties with managing INR, need for frequent lab draws etc, she wishes to continue Eliquis and accept its suboptimal efficacy while on primidone. Per the patient's daughter at bedside he takes 1 tablet in the morning and half a tablet at night.   CAD, CABG -Stable, no concern for ACS at this time  Essential tremors,  -Chronically on primidone for 20+ years, propranolol resumed as well -See discussion above regarding Eliquis  COPD (chronic obstructive pulmonary disease) (HCC) -Continue as needed nebs  Pressure injury of skin Present on admission   Active Pressure Injury/Wound(s)     Pressure Ulcer  Duration          Pressure Injury 05/02/22 Coccyx Posterior;Medial Stage 1 -  Intact skin with non-blanchable redness of a localized area usually over a bony prominence. <1 day   Pressure Injury 05/02/22 Heel Right Stage 2 -  Partial thickness loss of dermis presenting as a shallow open injury with a red, pink wound bed without slough. <1 day  DVT prophylaxis: Eliquis  Code Status: DNR Family Communication: Discussed the plan with his daughter at bedside on 05/08/2022 Disposition  Plan: Possible DC to SNF on 05/09/2022 Consultants: Cardiology   Procedures:   Antimicrobials:    Objective: Vitals:   05/08/22 0720 05/08/22 0900 05/08/22 0906 05/08/22 1250  BP: 124/63     Pulse: 90     Resp: 16     Temp: (!) 97.5 F (36.4 C)     TempSrc: Oral     SpO2: 96% 96% 96% 95%  Weight:      Height:        Intake/Output Summary (Last 24 hours) at 05/08/2022 1420 Last data filed at 05/08/2022 1300 Gross per 24 hour  Intake 1011 ml  Output 1250 ml  Net -239 ml   Filed Weights   05/07/22 0000 05/07/22 0500 05/08/22 0325  Weight: 103 kg 103 kg 101 kg    Examination:  General exam: Obese chronically ill-appearing, in no acute distress.  Alert and interactive.   HEENT: Obese unable to assess JVD CVS: S1-S2 no rubs or gallops. Lungs: Mild rales at bases with no wheezing noted.  Poor inspiratory effort. Abdomen: Soft obese bowel sounds present Extremities: Trace lower extremity edema bilaterally. Skin: No rashes on exposed skin Psychiatry: Flat affect.    Data Reviewed:   CBC: Recent Labs  Lab 05/01/22 1422 05/01/22 1446 05/01/22 1813 05/01/22 2102 05/02/22 0040 05/06/22 0346  WBC 7.0  --   --   --  12.3* 6.8  NEUTROABS 4.7  --   --   --   --   --   HGB 11.8* 11.9*  11.6* 12.2* 11.6* 11.9* 11.3*  HCT 35.2* 35.0*  34.0* 36.0* 34.0* 35.7* 32.2*  MCV 105.4*  --   --   --  105.3* 101.3*  PLT 156  --   --   --  135* 181   Basic Metabolic Panel: Recent Labs  Lab 05/03/22 1024 05/04/22 0329 05/05/22 0420 05/06/22 0346 05/07/22 0239  NA 134* 134* 137 136 134*  K 3.6 3.7 4.1 3.9 4.4  CL 97* 94* 96* 96* 94*  CO2 29 30 30 30 29   GLUCOSE 112* 107* 94 101* 96  BUN 21 20 24* 27* 29*  CREATININE 1.01 1.13 1.03 0.94 1.05  CALCIUM 8.6* 8.6* 8.7* 8.9 8.6*   GFR: Estimated Creatinine Clearance: 54.2 mL/min (by C-G formula based on SCr of 1.05 mg/dL). Liver Function Tests: Recent Labs  Lab 05/01/22 1422  AST 27  ALT 20  ALKPHOS 63  BILITOT 0.5   PROT 6.4*  ALBUMIN 3.2*   No results for input(s): "LIPASE", "AMYLASE" in the last 168 hours. Recent Labs  Lab 05/02/22 0040  AMMONIA 34   Coagulation Profile: No results for input(s): "INR", "PROTIME" in the last 168 hours. Cardiac Enzymes: No results for input(s): "CKTOTAL", "CKMB", "CKMBINDEX", "TROPONINI" in the last 168 hours. BNP (last 3 results) No results for input(s): "PROBNP" in the last 8760 hours. HbA1C: No results for input(s): "HGBA1C" in the last 72 hours. CBG: No results for input(s): "GLUCAP" in the last 168 hours. Lipid Profile: No results for input(s): "CHOL", "HDL", "LDLCALC", "TRIG", "CHOLHDL", "LDLDIRECT" in the last 72 hours. Thyroid Function Tests: No results for input(s): "TSH", "T4TOTAL", "FREET4", "T3FREE", "THYROIDAB" in the last 72 hours. Anemia Panel: No results for input(s): "VITAMINB12", "FOLATE", "FERRITIN", "TIBC", "IRON", "RETICCTPCT" in the last 72 hours. Urine analysis: No results found for: "COLORURINE", "APPEARANCEUR", "LABSPEC", "PHURINE", "GLUCOSEU", "HGBUR", "BILIRUBINUR", "KETONESUR", "PROTEINUR", "  UROBILINOGEN", "NITRITE", "LEUKOCYTESUR" Sepsis Labs: @LABRCNTIP (procalcitonin:4,lacticidven:4)  ) Recent Results (from the past 240 hour(s))  Resp Panel by RT-PCR (Flu A&B, Covid) Anterior Nasal Swab     Status: None   Collection Time: 05/01/22  9:15 PM   Specimen: Anterior Nasal Swab  Result Value Ref Range Status   SARS Coronavirus 2 by RT PCR NEGATIVE NEGATIVE Final    Comment: (NOTE) SARS-CoV-2 target nucleic acids are NOT DETECTED.  The SARS-CoV-2 RNA is generally detectable in upper respiratory specimens during the acute phase of infection. The lowest concentration of SARS-CoV-2 viral copies this assay can detect is 138 copies/mL. A negative result does not preclude SARS-Cov-2 infection and should not be used as the sole basis for treatment or other patient management decisions. A negative result may occur with  improper  specimen collection/handling, submission of specimen other than nasopharyngeal swab, presence of viral mutation(s) within the areas targeted by this assay, and inadequate number of viral copies(<138 copies/mL). A negative result must be combined with clinical observations, patient history, and epidemiological information. The expected result is Negative.  Fact Sheet for Patients:  BloggerCourse.comhttps://www.fda.gov/media/152166/download  Fact Sheet for Healthcare Providers:  SeriousBroker.ithttps://www.fda.gov/media/152162/download  This test is no t yet approved or cleared by the [place] FDA and  has been authorized for detection and/or diagnosis of SARS-CoV-2 by FDA under an Emergency Use Authorization (EUA). This EUA will remain  in effect (meaning this test can be used) for the duration of the COVID-19 declaration under Section 564(b)(1) of the Act, 21 U.S.C.section 360bbb-3(b)(1), unless the authorization is terminated  or revoked sooner.       Influenza A by PCR NEGATIVE NEGATIVE Final   Influenza B by PCR NEGATIVE NEGATIVE Final    Comment: (NOTE) The Xpert Xpress SARS-CoV-2/FLU/RSV plus assay is intended as an aid in the diagnosis of influenza from Nasopharyngeal swab specimens and should not be used as a sole basis for treatment. Nasal washings and aspirates are unacceptable for Xpert Xpress SARS-CoV-2/FLU/RSV testing.  Fact Sheet for Patients: BloggerCourse.comhttps://www.fda.gov/media/152166/download  Fact Sheet for Healthcare Providers: SeriousBroker.ithttps://www.fda.gov/media/152162/download  This test is not yet approved or cleared by the [place] FDA and has been authorized for detection and/or diagnosis of SARS-CoV-2 by FDA under an Emergency Use Authorization (EUA). This EUA will remain in effect (meaning this test can be used) for the duration of the COVID-19 declaration under Section 564(b)(1) of the Act, 21 U.S.C. section 360bbb-3(b)(1), unless the authorization is terminated or revoked.  Performed at  [facility], [address] [place], [place] [address]      Radiology Studies: No results found.   Scheduled Meds:  apixaban  5 mg Oral BID   atorvastatin  40 mg Oral QHS   brimonidine  1 drop Both Eyes BID   budesonide  2 mL Inhalation BID   cholecalciferol  5,000 Units Oral Daily   dorzolamide-timolol  1 drop Right Eye BID   empagliflozin  10 mg Oral Daily   folic acid-pyridoxine-cyancobalamin  1 tablet Oral Daily   ipratropium-albuterol  3 mL Inhalation BID   latanoprost  1 drop Both Eyes QHS   levothyroxine  125 mcg Oral Daily   primidone  125 mg Oral QHS   And   primidone  250 mg Oral q morning   propranolol  20 mg Oral TID   spironolactone  12.5 mg Oral Daily   torsemide  40 mg Oral Daily   Continuous Infusions:     LOS: 7 days  Time spent: 31min   Irene Pap, MD [facility]   05/08/2022, 2:20 PM

## 2022-05-08 NOTE — Care Management Important Message (Signed)
Important Message  Patient Details  Name: LAMARCUS SPIRA MRN: [id number] Date of Birth: 15-Jun-1933   Medicare Important Message Given:  Yes     Shelda Altes 05/08/2022, 9:34 AM

## 2022-05-08 NOTE — TOC Progression Note (Signed)
Transition of Care [facility]) - Progression Note    Patient Details  Name: Billy Smith MRN: [id number] Date of Birth: 1933/03/08  Transition of Care [facility]) CM/SW [place], [place] Phone Number: 05/08/2022, 11:19 AM  Clinical Narrative:     CSW called pt daughter Billy Smith to confirm they would like pt to transition from [place] to [facility]. [name] confirmed preference for [facility]. She provides history that pt has been back and forth from hospital and SNF a few times in the last 2-3 months. He is likely in medicare copay days. Daughter has applied for Medicaid for longterm care(pending) though their goal would be for pt to rehab and return home. CSW called and imitated insurance auth  with [employer] for STR; currently pending.   Expected Discharge Plan: Skilled Nursing Facility Barriers to Discharge: Continued Medical Work up, Ship broker  Expected Discharge Plan and Services Expected Discharge Plan: [place]                                               Social Determinants of Health (SDOH) Interventions Food Insecurity Interventions: Intervention Not Indicated Housing Interventions: Intervention Not Indicated Transportation Interventions: Intervention Not Indicated Utilities Interventions: Intervention Not Indicated Alcohol Usage Interventions: Intervention Not Indicated (Score <7)  Readmission Risk Interventions     No data to display

## 2022-05-08 NOTE — Progress Notes (Signed)
Rounding Note    Patient Name: Billy Smith Date of Encounter: 05/08/2022  [place] Cardiologist: Lauree Chandler, MD   Subjective   States that he wants to leave the hospital and go back to [place]. No dyspnea at rest but has a little bit of wheezing. Switched to oral diuretics yesterday, maintaining slightly net negative fluid balance. Net diuresis 4.5 L since admission, weight down roughly 4 kilograms since peak weight of 105.3 kg on day 2 of hospitalization. No labs ordered for today, but potassium and renal parameters were stable and close to normal range yesterday.  Inpatient Medications    Scheduled Meds:  apixaban  5 mg Oral BID   aspirin EC  81 mg Oral Daily   atorvastatin  40 mg Oral QHS   brimonidine  1 drop Both Eyes BID   budesonide  2 mL Inhalation BID   cholecalciferol  5,000 Units Oral Daily   dorzolamide-timolol  1 drop Right Eye BID   empagliflozin  10 mg Oral Daily   folic acid-pyridoxine-cyancobalamin  1 tablet Oral Daily   furosemide  40 mg Oral BID   ipratropium-albuterol  3 mL Inhalation BID   latanoprost  1 drop Both Eyes QHS   levothyroxine  125 mcg Oral Daily   primidone  125 mg Oral QHS   And   primidone  250 mg Oral q morning   propranolol  20 mg Oral TID   spironolactone  12.5 mg Oral Daily   Continuous Infusions:  PRN Meds: acetaminophen **OR** acetaminophen, albuterol, ondansetron **OR** ondansetron (ZOFRAN) IV, senna   Vital Signs    Vitals:   05/07/22 1935 05/07/22 1941 05/08/22 0325 05/08/22 0720  BP: (!) 127/56  117/62 124/63  Pulse: 68  65 90  Resp:  18 18 16   Temp: 97.8 F (36.6 C)  98 F (36.7 C) (!) 97.5 F (36.4 C)  TempSrc: Oral  Oral Oral  SpO2: 92% 91% 92% 96%  Weight:   101 kg   Height:        Intake/Output Summary (Last 24 hours) at 05/08/2022 0908 Last data filed at 05/08/2022 0830 Gross per 24 hour  Intake 897 ml  Output 1250 ml  Net -353 ml      05/08/2022    3:25 AM 05/07/2022     5:00 AM 05/07/2022   12:00 AM  Last 3 Weights  Weight (lbs) 222 lb 10.6 oz 227 lb 1.2 oz 227 lb 1.2 oz  Weight (kg) 101 kg 103 kg 103 kg      Telemetry    Sinus rhythm with atrial trigeminy, no flutter in the last 24 hours- Personally Reviewed  ECG    ECG from 05/02/2022 shows sinus rhythm, right bundle branch block- Personally Reviewed  Physical Exam  Severely obese GEN: No acute distress.   Neck: No JVD Cardiac: RRR, no murmurs, rubs, or gallops.  Respiratory: Moderate wheezing in the right lung, does not clear with cough, otherwise clear to auscultation bilaterally. GI: Soft, nontender, non-distended  MS: No edema, has some wrinkling pretibially bilaterally; No deformity. Neuro:  Nonfocal  Psych: Normal affect   Labs    High Sensitivity Troponin:   Recent Labs  Lab 05/01/22 1654 05/01/22 1831  TROPONINIHS 11 11     Chemistry Recent Labs  Lab 05/01/22 1422 05/01/22 1446 05/05/22 0420 05/06/22 0346 05/07/22 0239  NA 135   < > 137 136 134*  K 4.2   < > 4.1 3.9 4.4  CL  99   < > 96* 96* 94*  CO2 28   < > 30 30 29   GLUCOSE 129*   < > 94 101* 96  BUN 18   < > 24* 27* 29*  CREATININE 1.24   < > 1.03 0.94 1.05  CALCIUM 8.5*   < > 8.7* 8.9 8.6*  PROT 6.4*  --   --   --   --   ALBUMIN 3.2*  --   --   --   --   AST 27  --   --   --   --   ALT 20  --   --   --   --   ALKPHOS 63  --   --   --   --   BILITOT 0.5  --   --   --   --   GFRNONAA 56*   < > >60 >60 >60  ANIONGAP 8   < > 11 10 11    < > = values in this interval not displayed.    Lipids No results for input(s): "CHOL", "TRIG", "HDL", "LABVLDL", "LDLCALC", "CHOLHDL" in the last 168 hours.  Hematology Recent Labs  Lab 05/01/22 1422 05/01/22 1446 05/01/22 2102 05/02/22 0040 05/06/22 0346  WBC 7.0  --   --  12.3* 6.8  RBC 3.34*  --   --  3.39* 3.18*  HGB 11.8*   < > 11.6* 11.9* 11.3*  HCT 35.2*   < > 34.0* 35.7* 32.2*  MCV 105.4*  --   --  105.3* 101.3*  MCH 35.3*  --   --  35.1* 35.5*  MCHC 33.5   --   --  33.3 35.1  RDW 14.9  --   --  14.7 14.8  PLT 156  --   --  135* 181   < > = values in this interval not displayed.   Thyroid No results for input(s): "TSH", "FREET4" in the last 168 hours.  BNP Recent Labs  Lab 05/01/22 1423  BNP 152.7*    DDimer No results for input(s): "DDIMER" in the last 168 hours.   Radiology    No results found.  Cardiac Studies   Echo from 05/02/22:   1. Left ventricular ejection fraction, by estimation, is 60 to 65%. The  left ventricle has normal function. Left ventricular endocardial border  not optimally defined to evaluate regional wall motion. There is mild  concentric left ventricular  hypertrophy. Left ventricular diastolic parameters are consistent with  Grade II diastolic dysfunction (pseudonormalization).   2. Right ventricular systolic function was not well visualized. The right  ventricular size is not well visualized. There is mildly elevated  pulmonary artery systolic pressure. The estimated right ventricular  systolic pressure is 123XX123 mmHg.   3. The mitral valve is grossly normal. No evidence of mitral valve  regurgitation. No evidence of mitral stenosis.   4. The aortic valve is tricuspid. Aortic valve regurgitation is not  visualized. Aortic valve sclerosis is present, with no evidence of aortic  valve stenosis.   5. The inferior vena cava is dilated in size with <50% respiratory  variability, suggesting right atrial pressure of 15 mmHg.   Comparison(s): No significant change from prior study.   Patient Profile     88 y.o. male with history of paroxysmal atrial flutter, chronic diastolic dysfunction CHF, COPD, obstructive sleep apnea, coronary artery disease s/p 4V CABG (LIMA to LAD, SVG to intermediate, SVG to circumflex marginal, SVG to PDA), cardiology is  following for acute exacerbation of diastolic heart failure.   Assessment & Plan    "Dry weight" seems to be around 222-223 pounds (101 kg). It is critical that he  continue to receive daily weights once he leaves the hospital and goes back to the rehab facility. Low-sodium diet. Did better with torsemide rather than furosemide in the past according to his daughter and we will switch back to that agent. On propranolol both for rhythm control and for tremor. Anticoagulated with Eliquis without bleeding complication. Dissipate discharge in the next 24-48 hours, depending on bed availability.  [place] will sign off.   Medication Recommendations: Torsemide 40 mg twice daily, spironolactone 12.5 mg daily, Eliquis 5 mg twice daily, propranolol 20 mg twice daily, Jardiance 10 mg daily, atorvastatin 40 mg daily, Will stop aspirin since he is fully anticoagulated Other recommendations (labs, testing, etc): Daily weights.  Call physician if weight exceeds 225 pounds. Follow up as an outpatient: We will make arrangements for follow-up in 2-3 weeks, probably in our [place] office since he will still be at [facility].  The long-term plan is for him to follow-up with Dr. Agustin Cree in [place].  For questions or updates, please contact [place] Please consult [email or web address] for contact info under        Signed, Sanda Klein, MD  05/08/2022, 9:08 AM

## 2022-05-09 DIAGNOSIS — I5033 Acute on chronic diastolic (congestive) heart failure: Secondary | ICD-10-CM | POA: Diagnosis not present

## 2022-05-09 DIAGNOSIS — I2510 Atherosclerotic heart disease of native coronary artery without angina pectoris: Secondary | ICD-10-CM

## 2022-05-09 DIAGNOSIS — J690 Pneumonitis due to inhalation of food and vomit: Secondary | ICD-10-CM | POA: Diagnosis present

## 2022-05-09 DIAGNOSIS — I4892 Unspecified atrial flutter: Secondary | ICD-10-CM | POA: Diagnosis not present

## 2022-05-09 DIAGNOSIS — G9341 Metabolic encephalopathy: Secondary | ICD-10-CM | POA: Diagnosis not present

## 2022-05-09 DIAGNOSIS — I2583 Coronary atherosclerosis due to lipid rich plaque: Secondary | ICD-10-CM

## 2022-05-09 MED ORDER — PRIMIDONE 125 MG PO TABS: 125.0000 mg | ORAL_TABLET | Freq: Every day | ORAL | 0 refills | Status: AC

## 2022-05-09 MED ORDER — SPIRONOLACTONE 25 MG PO TABS: 12.5000 mg | ORAL_TABLET | Freq: Every day | ORAL | 0 refills | Status: DC

## 2022-05-09 MED ORDER — PRIMIDONE 250 MG PO TABS: 250.0000 mg | ORAL_TABLET | Freq: Every morning | ORAL | Status: DC

## 2022-05-09 MED ORDER — EMPAGLIFLOZIN 10 MG PO TABS: 10.0000 mg | ORAL_TABLET | Freq: Every day | ORAL | 0 refills | Status: AC

## 2022-05-09 MED ORDER — PRIMIDONE 250 MG PO TABS
125.0000 mg | ORAL_TABLET | Freq: Every day | ORAL | Status: DC
  Administered 2022-05-09: 125 mg via ORAL
  Filled 2022-05-09: qty 1

## 2022-05-09 MED ORDER — TORSEMIDE 40 MG PO TABS: 40.0000 mg | ORAL_TABLET | Freq: Two times a day (BID) | ORAL | 0 refills | Status: DC

## 2022-05-09 MED ORDER — PRIMIDONE 250 MG PO TABS: 250.0000 mg | ORAL_TABLET | Freq: Every morning | ORAL | 0 refills | Status: AC

## 2022-05-09 MED ORDER — PROPRANOLOL HCL 20 MG PO TABS: 20.0000 mg | ORAL_TABLET | Freq: Two times a day (BID) | ORAL | 0 refills | Status: DC

## 2022-05-09 NOTE — Progress Notes (Signed)
Physical Therapy Treatment Patient Details Name: Billy Smith MRN: [id number] DOB: 1932-11-22 Today's Date: 05/09/2022   History of Present Illness Pt is an 88 y.o. male admitted from SNF on 05/01/22 with respiratory distress, lethargy, BLE edema. Workup for acute on chronic CHF. Of note, recent hospitalization in [facility] for PNA, HF, aflutter with d/c to SNF for rehab. Other PMH includes COPD, CAD (s/p CABG), OSA, tremors.   PT Comments    Pt progressing with mobility. Today's session focused on transfer and gait training with RW; pt requiring up to [facility] for mobility with frequent seated rest breaks, dependent for standing ADL tasks due to bowel incontinence. Pt remains limited by generalized weakness, decreased activity tolerance, and impaired balance strategies/postural reactions. Continue to recommend SNF-level therapies to maximize functional mobility and independence. Will follow acutely to address established goals.    Recommendations for follow up therapy are one component of a multi-disciplinary discharge planning process, led by the attending physician.  Recommendations may be updated based on patient status, additional functional criteria and insurance authorization.  Follow Up Recommendations  Skilled nursing-[facility] term rehab (<3 hours/day) Can patient physically be transported by private vehicle: Yes   Assistance Recommended at Discharge Frequent or constant Supervision/Assistance  Patient can return home with the following A lot of help with walking and/or transfers;A lot of help with bathing/dressing/bathroom;Assistance with cooking/housework;Direct supervision/assist for medications management;Direct supervision/assist for financial management;Assist for transportation;Help with stairs or ramp for entrance   Equipment Recommendations  Other (comment) (defer to next venue)    Recommendations for Other Services  Mobility Specialist     Precautions / Restrictions  Precautions Precautions: Fall;Other (comment) Precaution Comments: bladder/bowel incontinence Restrictions Weight Bearing Restrictions: No     Mobility  Bed Mobility Overal bed mobility: Needs Assistance Bed Mobility: Supine to Sit     Supine to sit: Min assist, HOB elevated     General bed mobility comments: significant increased time, pt requiring repeated cues to stay on task as pt pausing frequently, at one point needing increased time to pee before primofit disconnected; good BLE management and use of bed rail, minA for trunk elevation and scooting hips forward    Transfers Overall transfer level: Needs assistance Equipment used: Rolling walker (2 wheels) Transfers: Sit to/from Stand Sit to Stand: Min assist, Mod assist           General transfer comment: cues for hand placement, pt rocking forward in order to stand, requiring initial minA to stand from EOB to RW, modA for trunk elevation standing from low toilet height (pt adamantly refused BSC placed over toilet) and chair with armrests    Ambulation/Gait Ambulation/Gait assistance: Min guard, Min assist Gait Distance (Feet): 20 Feet (+ 4' + 16') Assistive device: Rolling walker (2 wheels) Gait Pattern/deviations: Step-to pattern, Decreased stride length, Wide base of support, Trunk flexed, Shuffle, Step-through pattern Gait velocity: Decreased     General Gait Details: slow, tremulous gait with RW and close min guard for balance, intermittent minA for RW management and stability; frequent cues for closer proximity to RW and upright posture as pt with preference for significant forward flexed posture; sitting to void in bathroom, walking 4' out of bathroom and needing chair pulled up behind him for seated rest break; pt requiring max encouragement to ambulate rest of distance to recliner instead of returning to bed   Stairs             Wheelchair Mobility    Modified Rankin (Stroke Patients  Only)        Balance Overall balance assessment: Needs assistance Sitting-balance support: Feet supported Sitting balance-Leahy Scale: Fair     Standing balance support: During functional activity, Reliant on assistive device for balance Standing balance-Leahy Scale: Poor Standing balance comment: reliant on RW for support; dependent for posterior pericare                            Cognition Arousal/Alertness: Awake/alert Behavior During Therapy: WFL for tasks assessed/performed Overall Cognitive Status: History of cognitive impairments - at baseline Area of Impairment: Awareness, Safety/judgement, Following commands, Memory, Attention                   Current Attention Level: Sustained Memory: Decreased [place]-term memory Following Commands: Follows one step commands with increased time Safety/Judgement: Decreased awareness of safety Awareness: Emergent   General Comments: less interactive in conversation today requiring repeated cues to stay on task. pt making demands with care, requiring increased cues for cooperation and conversation, to actually make needs known, etc. when asked if pt needing anything else, pt reports, "Just put me in a box and send me to heaven"        Exercises      General Comments General comments (skin integrity, edema, etc.): pt adamant that primofit completely removed before going into bathroom despite education on leaving it connected due to urinary frequency/incontinence; upon return to chair, pt asking it be reconnected to suction, informed pt it was fully removed and NT will need to come reattach before he can void; pt declined urinal      Pertinent Vitals/Pain Pain Assessment Pain Assessment: Faces Faces Pain Scale: Hurts a little bit Pain Location: generalized Pain Descriptors / Indicators: Tiring Pain Intervention(s): Monitored during session    Home Living                          Prior Function            PT  Goals (current goals can now be found in the care plan section) Progress towards PT goals: Progressing toward goals    Frequency    Min 3X/week      PT Plan Current plan remains appropriate    Co-evaluation              AM-PAC PT "6 Clicks" Mobility   Outcome Measure  Help needed turning from your back to your side while in a flat bed without using bedrails?: A Little Help needed moving from lying on your back to sitting on the side of a flat bed without using bedrails?: A Lot Help needed moving to and from a bed to a chair (including a wheelchair)?: A Lot Help needed standing up from a chair using your arms (e.g., wheelchair or bedside chair)?: A Lot Help needed to walk in hospital room?: A Lot Help needed climbing 3-5 steps with a railing? : A Lot 6 Click Score: 13    End of Session Equipment Utilized During Treatment: Gait belt Activity Tolerance: Patient limited by fatigue Patient left: in chair;with call bell/phone within reach;with chair alarm set Nurse Communication: Mobility status;Other (comment) PT Visit Diagnosis: Other abnormalities of gait and mobility (R26.89);Muscle weakness (generalized) (M62.81)     Time: [id number] PT Time Calculation (min) (ACUTE ONLY): 39 min  Charges:  $Gait Training: 8-22 mins $Therapeutic Activity: 23-37 mins  Mabeline Caras, PT, DPT Acute Rehabilitation Services  Personal: [facility] Office: [place] 05/09/2022, 10:15 AM

## 2022-05-09 NOTE — Discharge Summary (Signed)
Physician Discharge Summary   Patient: Billy Smith MRN: [id number] DOB: 1933/03/31  Admit date:     05/01/2022  Discharge date: 05/09/22  Discharge Physician: York Ram Mattox Schorr   PCP: Paulina Fusi, MD   Recommendations at discharge:    Follow up with Dr Tomasa Blase in 7 to 10 days Follow up renal function in 7 days Patient has been placed on torsemide 40 mg bid, spironolactone and empagliflozin for diuresis.  Decreased propranolol to 20 mg po bid. Discontinue aspirin to reduce risk of bleeding.  Daily weight, call medical provider with weight greater than 225 lbs.   I spoke over the phone with the patient's daughter about patient's  condition, plan of care, prognosis and all questions were addressed.   Discharge Diagnoses: Principal Problem:   Acute on chronic diastolic CHF (congestive heart failure) (HCC) Active Problems:   Acute metabolic encephalopathy   Atrial flutter (HCC)   COPD (chronic obstructive pulmonary disease) (HCC)   Pressure injury of skin   Coronary artery disease   Class 2 obesity   Aspiration pneumonia (HCC)  Resolved Problems:   * No resolved hospital problems. [facility] Course: Mr. Russ was admitted to the hospital with the working diagnosis of decompensated heart failure.   88 yo male with the past medical history of diastolic heart failure, atrial flutter, COPD, and coronary artery disease sp CABG who presented with dyspnea. Recently hospitalized in [facility] for pneumonia, heart failure and uncontrolled atrial flutter. Patient was discharged to SNF. Noted several days of congestion and dyspnea. On the day of hospitalization he was found slumped in his chair in respiratory distress, his 02 saturation was in th 70's. EMS was called, he was placed on C pap and transported to the hospital. In the ED he was transitioned to Bipap, he was lethargic and not able to respond to questions. His blood pressure was 147/114, HR 60, RR 18 and 02  saturation 100%, lungs with diffuse wheezing and rales, heart with S1 and S2 present and bradycardic, abdomen with no distention and positive lower extremity edema.   VBG 7.26/ 67.5/ 41/ 30 NA 135, K 4,2 CL 99, bicarbonate 28, glucose 129, bun 18 cr 1,24  BNP 152  High sensitive troponin 11 Wbc 7,0 hgb 11.8 plt 156   Chest radiograph with left rotation, with cardiomegaly, bilateral hilar vascular congestion and interstitial infiltrates, more predominant at the lower zones more on the left. Small pleural effusion on the left. Sternotomy wires in place.   EKG 65 bpm, right axis deviation, normal intervals, sinus rhythm with no significant ST segment or T wave changes.    Patient was placed on furosemide for diuresis Received IV antibiotic therapy and IV systemic corticosteroids.   Patient had improvement in volume status along with his respiratory failure.  Follow up as outpatient.   Assessment and Plan: * Acute on chronic diastolic CHF (congestive heart failure) (HCC) Echocardiogram with preserved LV systolic function with EF 60 to 65%. Mild LVH. RVSP 39.2 mmHg.   Patient was placed on furosemide for diuresis, negative fluid balance was achieved, -4.053 with significant improvement in his symptoms.   Patient will continue medical therapy with empagliflozin, spironolactone and torsemide.   Acute on chronic hypoxemic and hypercapnic respiratory failure. (present on admission).   Clinically has improved with diuresis.   Patient was liberated from non invasive mechanical ventilation.  At the time of his discharge his 02 saturation was 92% on room air.     Acute  hypercapnic respiratory failure (HCC) Most likely secondary to acute COPD and acute CHF exacerbation Patient was hypoxic in the field with room air pulse oximetry in the 70s ABG shows uncompensated respiratory acidosis He is currently on BiPAP Repeat ABG in 2 hours We will attempt to wean patient off BiPAP as  tolerated  Acute metabolic encephalopathy Clinically improved with supportive medical care.    Essential tremors, continue with primidone and propranolol.   Atrial flutter (HCC) Paroxysmal Patient has been maintained on sinus rhythm, plan to continue anticoagulation with apixaban.   Discussed drug interaction of apixaban and primidone with daughter 9/13 and 9/14 she is unwilling to taper his primidone on account of severe debilitating tremors for which he has been on primidone for 20 years, refuses warfarin due to overall debility and difficulties with managing INR, need for frequent lab draws etc, she wishes to continue Eliquis and accept its suboptimal efficacy while on primidone. Per the patient's daughter at bedside he takes 1 tablet in the morning and half a tablet at night  COPD (chronic obstructive pulmonary disease) ([place]) Patient with no clinical signs of acute exacerbation  Plan to continue bronchodilator therapy   Pressure injury of skin Present on admission   Active Pressure Injury/Wound(s)     Pressure Ulcer  Duration          Pressure Injury 05/02/22 Coccyx Posterior;Medial Stage 1 -  Intact skin with non-blanchable redness of a localized area usually over a bony prominence. <1 day   Pressure Injury 05/02/22 Heel Right Stage 2 -  Partial thickness loss of dermis presenting as a shallow open injury with a red, pink wound bed without slough. <1 day          Coronary artery disease Sp CABG No chest pain.   Class 2 obesity Calculated BMI is 38,6   Aspiration pneumonia (HCC) Left lower lobe Patient was treated with antibiotic therapy with good toleration.  Speech evaluation has recommended dysphagia 2 diet. Her daughter declined further work up with modified barium study.           Consultants: cardiology  Procedures performed: none   Disposition: Skilled nursing facility Diet recommendation:  Discharge Diet Orders (From admission, onward)      Start     Ordered   05/09/22 0000  Diet - low sodium heart healthy        05/09/22 1059           Cardiac diet DISCHARGE MEDICATION: Allergies as of 05/09/2022   No Known Allergies      Medication List     STOP taking these medications    aspirin EC 81 MG tablet   furosemide 40 MG tablet Commonly known as: LASIX       TAKE these medications    albuterol 108 (90 Base) MCG/ACT inhaler Commonly known as: VENTOLIN HFA Inhale 2 puffs into the lungs every 6 (six) hours as needed for wheezing or shortness of breath.   atorvastatin 40 MG tablet Commonly known as: LIPITOR Take 40 mg by mouth at bedtime.   brimonidine 0.15 % ophthalmic solution Commonly known as: ALPHAGAN Place 1 drop into both eyes 2 (two) times daily.   budesonide 0.5 MG/2ML nebulizer solution Commonly known as: PULMICORT Inhale 2 mLs into the lungs 2 (two) times daily.   calcium-vitamin D 500-5 MG-MCG tablet Commonly known as: OSCAL WITH D Take 1 tablet by mouth 2 (two) times daily.   Cholecalciferol 125 MCG (5000 UT) Tabs Take 5,000 Units  by mouth daily.   dorzolamide-timolol 22.3-6.8 MG/ML ophthalmic solution Commonly known as: COSOPT Place 1 drop into the right eye 2 (two) times daily.   Eliquis 5 MG Tabs tablet Generic drug: apixaban Take 5 mg by mouth 2 (two) times daily.   empagliflozin 10 MG Tabs tablet Commonly known as: JARDIANCE Take 1 tablet (10 mg total) by mouth daily. Start taking on: May 10, 2022   Foltabs 800 0.8-10-0.115 MG Tabs Generic drug: Folic Acid-Vit B6-Vit B12 Take 1 tablet by mouth daily.   guaiFENesin 100 MG/5ML liquid Commonly known as: ROBITUSSIN Take 10 mLs by mouth every 6 (six) hours as needed for cough.   ipratropium-albuterol 0.5-2.5 (3) MG/3ML Soln Commonly known as: DUONEB Inhale 3 mLs into the lungs 3 (three) times daily.   levothyroxine 125 MCG tablet Commonly known as: SYNTHROID Take 125 mcg by mouth daily.   Lumigan 0.01 %  Soln Generic drug: bimatoprost Place 1 drop into both eyes at bedtime.   Primidone 125 MG Tabs Take 125 mg by mouth daily at 6 PM. What changed:  when to take this additional instructions   primidone 250 MG tablet Commonly known as: MYSOLINE Take 1 tablet (250 mg total) by mouth every morning. Start taking on: May 10, 2022 What changed: You were already taking a medication with the same name, and this prescription was added. Make sure you understand how and when to take each.   propranolol 20 MG tablet Commonly known as: INDERAL Take 1 tablet (20 mg total) by mouth 2 (two) times daily. What changed: when to take this   senna 8.6 MG tablet Commonly known as: SENOKOT Take 1 tablet by mouth every 12 (twelve) hours as needed for constipation.   spironolactone 25 MG tablet Commonly known as: ALDACTONE Take 0.5 tablets (12.5 mg total) by mouth daily. Start taking on: May 10, 2022   Torsemide 40 MG Tabs Take 40 mg by mouth 2 (two) times daily.               Discharge Care Instructions  (From admission, onward)           Start     Ordered   05/09/22 0000  Discharge wound care:       Comments: Wound prevention per protocol.   05/09/22 1059            Follow-up Information     Paulina FusiSchultz, Douglas E, MD. Go on 05/17/2022.   Specialty: Internal Medicine Why: @2 :00pm Contact information: [address] [place] [address] [phone]         Kathleene HazelMcAlhany, Christopher D, MD .   Specialty: Cardiology Contact information: [address] [place] [address] [phone]                Discharge Exam: Filed Weights   05/07/22 0500 05/08/22 0325 05/09/22 0400  Weight: 103 kg 101 kg 102.2 kg   BP (!) 118/49 (BP Location: Left Arm)   Pulse 78   Temp 97.6 F (36.4 C) (Oral)   Resp 16   Ht 5\' 5"  (1.651 m)   Wt 102.2 kg   SpO2 91%   BMI 37.49 kg/m   Patient is feeling well ,with no dyspnea or chest  pain  Neurology awake and alert ENT with no pallor Cardiovascular with S1 and S2 present with no gallops or rubs No JVD Trace non pitting lower extremity edema Respiratory with no rales or wheezing Abdomen with no distention  Condition at discharge: stable  The results of significant diagnostics from this hospitalization (including imaging, microbiology, ancillary and laboratory) are listed below for reference.   Imaging Studies: DG CHEST PORT 1 VIEW  Result Date: 05/03/2022 CLINICAL DATA:  CT no, pulmonary vascular congestion EXAM: PORTABLE CHEST 1 VIEW COMPARISON:  Radiograph 05/01/2022 FINDINGS: Unchanged cardiomediastinal silhouette with prior median sternotomy and CABG. Improvement in left lower lung aeration in comparison to prior exam, likely improved atelectasis. Decreased interstitial opacities. Decreased left pleural effusion. No evidence of pneumothorax. Bones are unchanged. IMPRESSION: Decreased pulmonary edema and left pleural effusion with improving atelectasis in the left lung base. Electronically Signed   By: Caprice Renshaw M.D.   On: 05/03/2022 08:19   ECHOCARDIOGRAM COMPLETE  Result Date: 05/02/2022    ECHOCARDIOGRAM REPORT   Patient Name:   TRAVIN MARIK Jons Date of Exam: 05/02/2022 Medical Rec #:  [id number]     Height:       65.0 in Accession #:    [id number]    Weight:       232.1 lb Date of Birth:  1933-08-21     BSA:          2.107 m Patient Age:    89 years      BP:           105/58 mmHg Patient Gender: M             HR:           72 bpm. Exam Location:  Inpatient Procedure: 2D Echo, Cardiac Doppler and Color Doppler Indications:    CHF  History:        Patient has prior history of Echocardiogram examinations, most                 recent 03/03/2020. CAD.  Sonographer:    Eduard Roux Referring Phys: [id number] KARDIE TOBB IMPRESSIONS  1. Left ventricular ejection fraction, by estimation, is 60 to 65%. The left ventricle has normal function. Left ventricular endocardial border  not optimally defined to evaluate regional wall motion. There is mild concentric left ventricular hypertrophy. Left ventricular diastolic parameters are consistent with Grade II diastolic dysfunction (pseudonormalization).  2. Right ventricular systolic function was not well visualized. The right ventricular size is not well visualized. There is mildly elevated pulmonary artery systolic pressure. The estimated right ventricular systolic pressure is 39.2 mmHg.  3. The mitral valve is grossly normal. No evidence of mitral valve regurgitation. No evidence of mitral stenosis.  4. The aortic valve is tricuspid. Aortic valve regurgitation is not visualized. Aortic valve sclerosis is present, with no evidence of aortic valve stenosis.  5. The inferior vena cava is dilated in size with <50% respiratory variability, suggesting right atrial pressure of 15 mmHg. Comparison(s): No significant change from prior study. FINDINGS  Left Ventricle: Left ventricular ejection fraction, by estimation, is 60 to 65%. The left ventricle has normal function. Left ventricular endocardial border not optimally defined to evaluate regional wall motion. The left ventricular internal cavity size was normal in size. There is mild concentric left ventricular hypertrophy. Left ventricular diastolic parameters are consistent with Grade II diastolic dysfunction (pseudonormalization). Right Ventricle: The right ventricular size is not well visualized. Right vetricular wall thickness was not well visualized. Right ventricular systolic function was not well visualized. There is mildly elevated pulmonary artery systolic pressure. The tricuspid regurgitant velocity is 2.46 m/s, and with an assumed right atrial pressure of 15 mmHg, the estimated right ventricular systolic pressure is  39.2 mmHg. Left Atrium: Left atrial size was normal in size. Right Atrium: Right atrial size was normal in size. Pericardium: Trivial pericardial effusion is present. Presence  of epicardial fat layer. Mitral Valve: The mitral valve is grossly normal. No evidence of mitral valve regurgitation. No evidence of mitral valve stenosis. Tricuspid Valve: The tricuspid valve is grossly normal. Tricuspid valve regurgitation is trivial. No evidence of tricuspid stenosis. Aortic Valve: The aortic valve is tricuspid. Aortic valve regurgitation is not visualized. Aortic valve sclerosis is present, with no evidence of aortic valve stenosis. Aortic valve peak gradient measures 4.0 mmHg. Pulmonic Valve: The pulmonic valve was grossly normal. Pulmonic valve regurgitation is not visualized. No evidence of pulmonic stenosis. Aorta: The aortic root and ascending aorta are structurally normal, with no evidence of dilitation. Venous: The inferior vena cava is dilated in size with less than 50% respiratory variability, suggesting right atrial pressure of 15 mmHg. IAS/Shunts: The atrial septum is grossly normal.  LEFT VENTRICLE PLAX 2D LVIDd:         4.80 cm   Diastology LVIDs:         3.00 cm   LV e' medial:    5.98 cm/s LV PW:         1.30 cm   LV E/e' medial:  10.7 LV IVS:        1.20 cm   LV e' lateral:   7.42 cm/s LVOT diam:     2.10 cm   LV E/e' lateral: 8.6 LV SV:         62 LV SV Index:   30 LVOT Area:     3.46 cm  IVC IVC diam: 2.60 cm LEFT ATRIUM           Index LA diam:      3.60 cm 1.71 cm/m LA Vol (A4C): 35.6 ml 16.89 ml/m  AORTIC VALVE                 PULMONIC VALVE AV Area (Vmax): 3.51 cm     PV Vmax:       0.86 m/s AV Vmax:        99.65 cm/s   PV Peak grad:  3.0 mmHg AV Peak Grad:   4.0 mmHg LVOT Vmax:      101.00 cm/s LVOT Vmean:     53.500 cm/s LVOT VTI:       0.180 m  AORTA Ao Root diam: 3.90 cm Ao Asc diam:  3.60 cm MITRAL VALVE               TRICUSPID VALVE MV Area (PHT): 5.58 cm    TR Peak grad:   24.2 mmHg MV Decel Time: 136 msec    TR Vmax:        246.00 cm/s MV E velocity: 63.80 cm/s MV A velocity: 57.60 cm/s  SHUNTS MV E/A ratio:  1.11        Systemic VTI:  0.18 m                             Systemic Diam: 2.10 cm Lennie Odor MD Electronically signed by Lennie Odor MD Signature Date/Time: 05/02/2022/2:23:19 PM    Final    CT CHEST WO CONTRAST  Result Date: 05/01/2022 CLINICAL DATA:  Respiratory illness, nondiagnostic x-ray. Shortness of breath. EXAM: CT CHEST WITHOUT CONTRAST TECHNIQUE: Multidetector CT imaging of the chest was performed following the standard protocol without IV contrast. RADIATION DOSE  REDUCTION: This exam was performed according to the departmental dose-optimization program which includes automated exposure control, adjustment of the mA and/or kV according to patient size and/or use of iterative reconstruction technique. COMPARISON:  03/13/2022, 05/01/2022. FINDINGS: Cardiovascular: The heart is enlarged and there is no pericardial effusion. Multi-vessel coronary artery calcifications are noted. There is atherosclerotic calcification of the aorta without evidence of aneurysm. The pulmonary trunk is normal in caliber. Mediastinum/Nodes: No mediastinal or axillary lymphadenopathy. Evaluation of the hila is limited due to lack of IV contrast. The thyroid gland is within normal limits. There is air in the proximal esophagus resulting in mass effect and narrowing of the mid to distal trachea, unchanged from the prior exam and likely chronic. Lungs/Pleura: Scattered airspace opacities are present in the lungs bilaterally in there is consolidation in the lower lobes, greater on the left than on the right. No effusion or pneumothorax. Upper Abdomen: No acute abnormality. Musculoskeletal: Sternotomy wires are noted over the midline. Degenerative changes are present in the thoracic spine. No acute osseous abnormality. IMPRESSION: 1. Scattered airspace opacities in the lungs bilaterally with bilateral lower lobe consolidation, possible atelectasis or pneumonia. 2. Cardiomegaly with multi-vessel coronary artery calcifications. 3. Aortic atherosclerosis. Electronically Signed   By:  Thornell Sartorius M.D.   On: 05/01/2022 22:26   CT HEAD WO CONTRAST ( )  Result Date: 05/01/2022 CLINICAL DATA:  Mental status changes of unknown cause EXAM: CT HEAD WITHOUT CONTRAST TECHNIQUE: Contiguous axial images were obtained from the base of the skull through the vertex without intravenous contrast. RADIATION DOSE REDUCTION: This exam was performed according to the departmental dose-optimization program which includes automated exposure control, adjustment of the mA and/or kV according to patient size and/or use of iterative reconstruction technique. COMPARISON:  09/04/2012 FINDINGS: Brain: Generalized atrophy, cerebral and cerebellar. No midline shift or mass effect. Small vessel chronic ischemic changes of deep cerebral white matter. No intracranial hemorrhage, mass lesion, or evidence of acute infarction. No extra-axial fluid collections. Vascular: Atherosclerotic calcification of internal carotid and vertebral arteries at skull base Skull: Intact Sinuses/Orbits: Clear Other: N/A IMPRESSION: Atrophy with small vessel chronic ischemic changes of deep cerebral white matter. No acute intracranial abnormalities. Electronically Signed   By: Ulyses Southward M.D.   On: 05/01/2022 18:04   DG Chest Port 1 View  Result Date: 05/01/2022 CLINICAL DATA:  Shortness of breath EXAM: PORTABLE CHEST 1 VIEW COMPARISON:  Radiograph 03/16/2022 FINDINGS: Enlarged cardiomediastinal silhouette with prior median sternotomy and CABG. Pulmonary vascular congestion. Elevated left hemidiaphragm. There are left lower lung opacities. Suspected trace left pleural effusion. No visible pneumothorax. Bilateral shoulder degenerative changes. No acute osseous abnormality. IMPRESSION: Cardiomegaly with pulmonary vascular congestion. Left lower lung opacities, could reflect atelectasis, alveolar edema,, or potentially infection. Suspected trace left pleural effusion. Electronically Signed   By: Caprice Renshaw M.D.   On: 05/01/2022 14:41     Microbiology: Results for orders placed or performed during the hospital encounter of 05/01/22  Resp Panel by RT-PCR (Flu A&B, Covid) Anterior Nasal Swab     Status: None   Collection Time: 05/01/22  9:15 PM   Specimen: Anterior Nasal Swab  Result Value Ref Range Status   SARS Coronavirus 2 by RT PCR NEGATIVE NEGATIVE Final    Comment: (NOTE) SARS-CoV-2 target nucleic acids are NOT DETECTED.  The SARS-CoV-2 RNA is generally detectable in upper respiratory specimens during the acute phase of infection. The lowest concentration of SARS-CoV-2 viral copies this assay can detect is 138 copies/mL. A negative result  does not preclude SARS-Cov-2 infection and should not be used as the sole basis for treatment or other patient management decisions. A negative result may occur with  improper specimen collection/handling, submission of specimen other than nasopharyngeal swab, presence of viral mutation(s) within the areas targeted by this assay, and inadequate number of viral copies(<138 copies/mL). A negative result must be combined with clinical observations, patient history, and epidemiological information. The expected result is Negative.  Fact Sheet for Patients:  BloggerCourse.com  Fact Sheet for Healthcare Providers:  SeriousBroker.it  This test is no t yet approved or cleared by the [place] FDA and  has been authorized for detection and/or diagnosis of SARS-CoV-2 by FDA under an Emergency Use Authorization (EUA). This EUA will remain  in effect (meaning this test can be used) for the duration of the COVID-19 declaration under Section 564(b)(1) of the Act, 21 U.S.C.section 360bbb-3(b)(1), unless the authorization is terminated  or revoked sooner.       Influenza A by PCR NEGATIVE NEGATIVE Final   Influenza B by PCR NEGATIVE NEGATIVE Final    Comment: (NOTE) The Xpert Xpress SARS-CoV-2/FLU/RSV plus assay is intended as an  aid in the diagnosis of influenza from Nasopharyngeal swab specimens and should not be used as a sole basis for treatment. Nasal washings and aspirates are unacceptable for Xpert Xpress SARS-CoV-2/FLU/RSV testing.  Fact Sheet for Patients: BloggerCourse.com  Fact Sheet for Healthcare Providers: SeriousBroker.it  This test is not yet approved or cleared by the [place] FDA and has been authorized for detection and/or diagnosis of SARS-CoV-2 by FDA under an Emergency Use Authorization (EUA). This EUA will remain in effect (meaning this test can be used) for the duration of the COVID-19 declaration under Section 564(b)(1) of the Act, 21 U.S.C. section 360bbb-3(b)(1), unless the authorization is terminated or revoked.  Performed at [facility], [address], [place], [place] [address]     Labs: CBC: Recent Labs  Lab 05/06/22 0346  WBC 6.8  HGB 11.3*  HCT 32.2*  MCV 101.3*  PLT 181   Basic Metabolic Panel: Recent Labs  Lab 05/03/22 1024 05/04/22 0329 05/05/22 0420 05/06/22 0346 05/07/22 0239  NA 134* 134* 137 136 134*  K 3.6 3.7 4.1 3.9 4.4  CL 97* 94* 96* 96* 94*  CO2 29 30 30 30 29   GLUCOSE 112* 107* 94 101* 96  BUN 21 20 24* 27* 29*  CREATININE 1.01 1.13 1.03 0.94 1.05  CALCIUM 8.6* 8.6* 8.7* 8.9 8.6*   Liver Function Tests: No results for input(s): "AST", "ALT", "ALKPHOS", "BILITOT", "PROT", "ALBUMIN" in the last 168 hours. CBG: No results for input(s): "GLUCAP" in the last 168 hours.  Discharge time spent: greater than 30 minutes.  Signed: , MD [facility] 05/09/2022

## 2022-05-09 NOTE — TOC Progression Note (Signed)
Transition of Care [facility]) - Progression Note    Patient Details  Name: Billy Smith MRN: [id number] Date of Birth: [birth year]  Transition of Care [facility]) CM/SW [place], [place] Phone Number: 05/09/2022, 1:29 PM  Clinical Narrative:     CSW notified that HTA requesting recent PT note for auth. PT saw pt this morning; HTA notified that note is available.   1230: CSW received call from HTA; [place] approved. SNF auth going to Building surveyor for review.   Expected Discharge Plan: Skilled Nursing Facility Barriers to Discharge: No Barriers Identified  Expected Discharge Plan and Services Expected Discharge Plan: [place]         Expected Discharge Date: 05/09/22                                     Social Determinants of Health (SDOH) Interventions Food Insecurity Interventions: Intervention Not Indicated Housing Interventions: Intervention Not Indicated Transportation Interventions: Intervention Not Indicated Utilities Interventions: Intervention Not Indicated Alcohol Usage Interventions: Intervention Not Indicated (Score <7)  Readmission Risk Interventions     No data to display

## 2022-05-09 NOTE — Progress Notes (Signed)
Change in HR on tele monitor. EKG obtain showed Heart Block 2 degree type 1. MD made aware, ok to hold propranolol.

## 2022-05-09 NOTE — TOC Transition Note (Addendum)
Transition of Care [facility]) - CM/SW Discharge Note   Patient Details  Name: Billy Smith MRN: [id number] Date of Birth: 1933-05-14  Transition of [facility]) CM/SW Contact:  Bethann Berkshire, [place] Phone Number: 05/09/2022, 4:02 PM   Clinical Narrative:     SNF auth denied by [facility] medical director; peer to peer completed with attending and SNF auth still denied. CSW notified pt's daughter. Spoke with [facility] liaison and confirmed pt can still return under [place] approved: [id number]  Patient will DC to: [facility] [place] Anticipated DC date: 05/09/22 Family notified: Daughter [name] Transport by: Corey Harold   Per MD patient ready for DC to [facility]. RN, patient, patient's family, and facility notified of DC. Discharge Summary and FL2 sent to facility. RN to call report prior to discharge [phone]). DC packet on chart. Ambulance transport requested for patient.   CSW will sign off for now as social work intervention is no longer needed. Please consult [place] again if new needs arise.    Final next level of care: Skilled Nursing Facility Barriers to Discharge: No Barriers Identified   Patient Goals and CMS Choice        Discharge Placement              Patient chooses bed at: [facility], [place] Patient to be transferred to facility by: [place] Name of family member notified: Daughter [name] Patient and family notified of of transfer: 05/09/22  Discharge Plan and Services                                     Social Determinants of Health (SDOH) Interventions Food Insecurity Interventions: Intervention Not Indicated Housing Interventions: Intervention Not Indicated Transportation Interventions: Intervention Not Indicated Utilities Interventions: Intervention Not Indicated Alcohol Usage Interventions: Intervention Not Indicated (Score <7)   Readmission Risk Interventions     No data to display

## 2022-05-09 NOTE — Assessment & Plan Note (Addendum)
Left lower lobe Patient was treated with antibiotic therapy with good toleration.  Speech evaluation has recommended dysphagia 2 diet. Her daughter declined further work up with modified barium study.

## 2022-05-09 NOTE — Progress Notes (Signed)
Attempted to cal report x2 spoke with secretary both times and she transferred call, but no answer.

## 2022-05-09 NOTE — NC FL2 (Signed)
[place] MEDICAID FL2 LEVEL OF CARE SCREENING TOOL     IDENTIFICATION  Patient Name: Billy Smith Birthdate: 01-06-1933 Sex: male Admission Date (Current Location): 05/01/2022  [facility] and [place] Number:  Herbalist and Address:  [facility] [place]. [facility], [place] [address] [place], [place] [address]      Provider Number: [id number]  Attending Physician Name and Address:  Tawni Millers,*  Relative Name and Phone Number:  Martha Clan (Daughter)   [phone] [facility] Phone)    Current Level of Care:   Recommended Level of Care:   Prior Approval Number:    Date Approved/Denied:   PASRR Number: [id number]  Discharge Plan: SNF    Current Diagnoses: Patient Active Problem List   Diagnosis Date Noted   Aspiration pneumonia ([place]) 05/09/2022   Pressure injury of skin 05/02/2022   Class 2 obesity 05/02/2022   Acute on chronic diastolic CHF (congestive heart failure) ([place]) 05/01/2022   Acute hypercapnic respiratory failure ([place]) 84/13/2440   Acute metabolic encephalopathy 06/17/2535   Atrial flutter ([place]) 05/01/2022   HYPERLIPIDEMIA 05/06/2009   SLEEP APNEA, OBSTRUCTIVE 05/06/2009   Coronary artery disease 05/06/2009   VENOUS INSUFFICIENCY 05/06/2009   COPD (chronic obstructive pulmonary disease) ([place]) 05/06/2009   NEPHROLITHIASIS, HX OF 05/06/2009   CORONARY ARTERY BYPASS GRAFT, HX OF 05/06/2009    Orientation RESPIRATION BLADDER Height & Weight     Self, Place  Normal Incontinent Weight: 225 lb 5 oz (102.2 kg) Height:  5\' 5"  (165.1 cm)  BEHAVIORAL SYMPTOMS/MOOD NEUROLOGICAL BOWEL NUTRITION STATUS      Incontinent Diet (See d/c summary)  AMBULATORY STATUS COMMUNICATION OF NEEDS Skin   Extensive Assist   PU Stage and Appropriate Care (pressure injury right heel stage 2; pressure injury coccyx, posterior, medial stage 1)                       Personal Care Assistance Level of Assistance  Bathing, Feeding, Dressing Bathing  Assistance: Maximum assistance Feeding assistance: Independent Dressing Assistance: Limited assistance     Functional Limitations Info  Sight, Hearing, Speech Sight Info: Adequate Hearing Info: Adequate Speech Info: Adequate    SPECIAL CARE FACTORS FREQUENCY  PT (By licensed PT), OT (By licensed OT)     PT Frequency: 5x/week OT Frequency: 5x/week            Contractures Contractures Info: Not present    Additional Factors Info  Code Status, Allergies Code Status Info: DNR Allergies Info: no known allergies           Current Medications (05/09/2022):  This is the current hospital active medication list Current Facility-Administered Medications  Medication Dose Route Frequency Provider Last Rate Last Admin   acetaminophen (TYLENOL) tablet 650 mg  650 mg Oral Q6H PRN Agbata, Tochukwu, MD       Or   acetaminophen (TYLENOL) suppository 650 mg  650 mg Rectal Q6H PRN Agbata, Tochukwu, MD       albuterol (PROVENTIL) (2.5 MG/3ML) 0.083% nebulizer solution 2.5 mg  2.5 mg Nebulization Q2H PRN Agbata, Tochukwu, MD   2.5 mg at 05/03/22 1819   apixaban (ELIQUIS) tablet 5 mg  5 mg Oral BID Agbata, Tochukwu, MD   5 mg at 05/09/22 0849   atorvastatin (LIPITOR) tablet 40 mg  40 mg Oral QHS Agbata, Tochukwu, MD   40 mg at 05/08/22 2226   brimonidine (ALPHAGAN) 0.15 % ophthalmic solution 1 drop  1 drop Both Eyes BID Agbata, Tochukwu,  MD   1 drop at 05/09/22 0854   budesonide (PULMICORT) nebulizer solution 0.5 mg  2 mL Inhalation BID Agbata, Tochukwu, MD   0.5 mg at 05/09/22 0817   cholecalciferol (VITAMIN D3) 25 MCG (1000 UNIT) tablet 5,000 Units  5,000 Units Oral Daily Agbata, Tochukwu, MD   5,000 Units at 05/09/22 0849   dorzolamide-timolol (COSOPT) 22.3-6.8 MG/ML ophthalmic solution 1 drop  1 drop Right Eye BID Agbata, Tochukwu, MD   1 drop at 05/09/22 0853   empagliflozin (JARDIANCE) tablet 10 mg  10 mg Oral Daily Arrien, Jimmy Picket, MD   10 mg at [id number]   folic  acid-pyridoxine-cyancobalamin (FOLTX) 2.5-25-2 MG per tablet 1 tablet  1 tablet Oral Daily Agbata, Tochukwu, MD   1 tablet at 05/09/22 0849   ipratropium-albuterol (DUONEB) 0.5-2.5 (3) MG/3ML nebulizer solution 3 mL  3 mL Inhalation BID Domenic Polite, MD   3 mL at 05/09/22 0817   latanoprost (XALATAN) 0.005 % ophthalmic solution 1 drop  1 drop Both Eyes QHS Agbata, Tochukwu, MD   1 drop at 05/08/22 2200   levothyroxine (SYNTHROID) tablet 125 mcg  125 mcg Oral Daily Agbata, Tochukwu, MD   125 mcg at 05/09/22 0648   ondansetron (ZOFRAN) tablet 4 mg  4 mg Oral Q6H PRN Agbata, Tochukwu, MD       Or   ondansetron (ZOFRAN) injection 4 mg  4 mg Intravenous Q6H PRN Agbata, Tochukwu, MD       primidone (MYSOLINE) tablet 125 mg  125 mg Oral QHS Arrien, Jimmy Picket, MD       And   [START ON 05/10/2022] primidone (MYSOLINE) tablet 250 mg  250 mg Oral q morning Arrien, Jimmy Picket, MD       propranolol (INDERAL) tablet 20 mg  20 mg Oral TID Margie Billet, NP   20 mg at 05/08/22 2226   senna (SENOKOT) tablet 8.6 mg  1 tablet Oral Q12H PRN Agbata, Tochukwu, MD       spironolactone (ALDACTONE) tablet 12.5 mg  12.5 mg Oral Daily Domenic Polite, MD   12.5 mg at 05/09/22 0849   torsemide (DEMADEX) tablet 40 mg  40 mg Oral Daily Croitoru, Mihai, MD   40 mg at 05/09/22 0848     Discharge Medications: Please see discharge summary for a list of discharge medications.  Relevant Imaging Results:  Relevant Lab Results:   Additional Information SSN [id number]  Bethann Berkshire, LCSW

## 2022-05-11 DIAGNOSIS — L988 Other specified disorders of the skin and subcutaneous tissue: Secondary | ICD-10-CM | POA: Diagnosis not present

## 2022-05-15 ENCOUNTER — Encounter (HOSPITAL_COMMUNITY): Payer: PPO

## 2022-05-17 DIAGNOSIS — R262 Difficulty in walking, not elsewhere classified: Secondary | ICD-10-CM | POA: Diagnosis not present

## 2022-05-17 DIAGNOSIS — I4891 Unspecified atrial fibrillation: Secondary | ICD-10-CM | POA: Diagnosis not present

## 2022-05-17 DIAGNOSIS — I5033 Acute on chronic diastolic (congestive) heart failure: Secondary | ICD-10-CM | POA: Diagnosis not present

## 2022-05-17 DIAGNOSIS — J9621 Acute and chronic respiratory failure with hypoxia: Secondary | ICD-10-CM | POA: Diagnosis not present

## 2022-05-18 DIAGNOSIS — L988 Other specified disorders of the skin and subcutaneous tissue: Secondary | ICD-10-CM | POA: Diagnosis not present

## 2022-05-25 DIAGNOSIS — Z23 Encounter for immunization: Secondary | ICD-10-CM | POA: Diagnosis not present

## 2022-06-16 DIAGNOSIS — E039 Hypothyroidism, unspecified: Secondary | ICD-10-CM | POA: Diagnosis not present

## 2022-06-16 DIAGNOSIS — I5032 Chronic diastolic (congestive) heart failure: Secondary | ICD-10-CM | POA: Diagnosis not present

## 2022-06-16 DIAGNOSIS — I4891 Unspecified atrial fibrillation: Secondary | ICD-10-CM | POA: Diagnosis not present

## 2022-06-16 DIAGNOSIS — R5381 Other malaise: Secondary | ICD-10-CM | POA: Diagnosis not present

## 2022-06-22 DIAGNOSIS — R062 Wheezing: Secondary | ICD-10-CM | POA: Diagnosis not present

## 2022-06-22 DIAGNOSIS — I5033 Acute on chronic diastolic (congestive) heart failure: Secondary | ICD-10-CM | POA: Diagnosis not present

## 2022-07-17 DIAGNOSIS — N1831 Chronic kidney disease, stage 3a: Secondary | ICD-10-CM | POA: Diagnosis not present

## 2022-07-17 DIAGNOSIS — I2510 Atherosclerotic heart disease of native coronary artery without angina pectoris: Secondary | ICD-10-CM | POA: Diagnosis not present

## 2022-07-17 DIAGNOSIS — J449 Chronic obstructive pulmonary disease, unspecified: Secondary | ICD-10-CM | POA: Diagnosis not present

## 2022-07-17 DIAGNOSIS — I739 Peripheral vascular disease, unspecified: Secondary | ICD-10-CM | POA: Diagnosis not present

## 2022-07-19 ENCOUNTER — Emergency Department (HOSPITAL_COMMUNITY): Payer: PPO

## 2022-07-19 ENCOUNTER — Other Ambulatory Visit: Payer: Self-pay

## 2022-07-19 ENCOUNTER — Inpatient Hospital Stay (HOSPITAL_COMMUNITY)
Admission: EM | Admit: 2022-07-19 | Discharge: 2022-07-28 | DRG: 193 | Disposition: A | Discharge status: Skilled Nursing Facility | Payer: PPO | Source: Skilled Nursing Facility | Attending: Internal Medicine | Admitting: Internal Medicine

## 2022-07-19 DIAGNOSIS — R0602 Shortness of breath: Secondary | ICD-10-CM | POA: Diagnosis not present

## 2022-07-19 DIAGNOSIS — I4892 Unspecified atrial flutter: Secondary | ICD-10-CM | POA: Diagnosis present

## 2022-07-19 DIAGNOSIS — J441 Chronic obstructive pulmonary disease with (acute) exacerbation: Principal | ICD-10-CM

## 2022-07-19 DIAGNOSIS — Z66 Do not resuscitate: Secondary | ICD-10-CM | POA: Diagnosis not present

## 2022-07-19 DIAGNOSIS — Z7901 Long term (current) use of anticoagulants: Secondary | ICD-10-CM

## 2022-07-19 DIAGNOSIS — R1310 Dysphagia, unspecified: Secondary | ICD-10-CM | POA: Diagnosis not present

## 2022-07-19 DIAGNOSIS — Z6837 Body mass index (BMI) 37.0-37.9, adult: Secondary | ICD-10-CM

## 2022-07-19 DIAGNOSIS — Z20822 Contact with and (suspected) exposure to covid-19: Secondary | ICD-10-CM | POA: Diagnosis not present

## 2022-07-19 DIAGNOSIS — Z515 Encounter for palliative care: Secondary | ICD-10-CM

## 2022-07-19 DIAGNOSIS — E039 Hypothyroidism, unspecified: Secondary | ICD-10-CM | POA: Diagnosis not present

## 2022-07-19 DIAGNOSIS — Z7984 Long term (current) use of oral hypoglycemic drugs: Secondary | ICD-10-CM

## 2022-07-19 DIAGNOSIS — I110 Hypertensive heart disease with heart failure: Secondary | ICD-10-CM | POA: Diagnosis present

## 2022-07-19 DIAGNOSIS — I480 Paroxysmal atrial fibrillation: Secondary | ICD-10-CM | POA: Diagnosis not present

## 2022-07-19 DIAGNOSIS — N179 Acute kidney failure, unspecified: Secondary | ICD-10-CM | POA: Diagnosis not present

## 2022-07-19 DIAGNOSIS — G4733 Obstructive sleep apnea (adult) (pediatric): Secondary | ICD-10-CM | POA: Diagnosis present

## 2022-07-19 DIAGNOSIS — J189 Pneumonia, unspecified organism: Secondary | ICD-10-CM | POA: Diagnosis not present

## 2022-07-19 DIAGNOSIS — J9600 Acute respiratory failure, unspecified whether with hypoxia or hypercapnia: Secondary | ICD-10-CM | POA: Diagnosis not present

## 2022-07-19 DIAGNOSIS — I5031 Acute diastolic (congestive) heart failure: Secondary | ICD-10-CM | POA: Diagnosis not present

## 2022-07-19 DIAGNOSIS — I5033 Acute on chronic diastolic (congestive) heart failure: Secondary | ICD-10-CM | POA: Diagnosis not present

## 2022-07-19 DIAGNOSIS — R4182 Altered mental status, unspecified: Secondary | ICD-10-CM | POA: Diagnosis not present

## 2022-07-19 DIAGNOSIS — Y95 Nosocomial condition: Secondary | ICD-10-CM | POA: Diagnosis present

## 2022-07-19 DIAGNOSIS — Z888 Allergy status to other drugs, medicaments and biological substances status: Secondary | ICD-10-CM

## 2022-07-19 DIAGNOSIS — J449 Chronic obstructive pulmonary disease, unspecified: Secondary | ICD-10-CM | POA: Diagnosis present

## 2022-07-19 DIAGNOSIS — J440 Chronic obstructive pulmonary disease with acute lower respiratory infection: Secondary | ICD-10-CM | POA: Diagnosis not present

## 2022-07-19 DIAGNOSIS — I272 Pulmonary hypertension, unspecified: Secondary | ICD-10-CM | POA: Diagnosis not present

## 2022-07-19 DIAGNOSIS — Z951 Presence of aortocoronary bypass graft: Secondary | ICD-10-CM

## 2022-07-19 DIAGNOSIS — R000 Tachycardia, unspecified: Secondary | ICD-10-CM | POA: Diagnosis not present

## 2022-07-19 DIAGNOSIS — Z79899 Other long term (current) drug therapy: Secondary | ICD-10-CM

## 2022-07-19 DIAGNOSIS — R54 Age-related physical debility: Secondary | ICD-10-CM | POA: Diagnosis not present

## 2022-07-19 DIAGNOSIS — Z5181 Encounter for therapeutic drug level monitoring: Secondary | ICD-10-CM | POA: Diagnosis not present

## 2022-07-19 DIAGNOSIS — R0902 Hypoxemia: Secondary | ICD-10-CM | POA: Diagnosis not present

## 2022-07-19 DIAGNOSIS — I4891 Unspecified atrial fibrillation: Secondary | ICD-10-CM | POA: Diagnosis not present

## 2022-07-19 DIAGNOSIS — J9601 Acute respiratory failure with hypoxia: Secondary | ICD-10-CM | POA: Diagnosis not present

## 2022-07-19 DIAGNOSIS — G250 Essential tremor: Secondary | ICD-10-CM | POA: Diagnosis present

## 2022-07-19 DIAGNOSIS — I5032 Chronic diastolic (congestive) heart failure: Secondary | ICD-10-CM | POA: Insufficient documentation

## 2022-07-19 DIAGNOSIS — G9341 Metabolic encephalopathy: Secondary | ICD-10-CM | POA: Diagnosis not present

## 2022-07-19 DIAGNOSIS — J90 Pleural effusion, not elsewhere classified: Secondary | ICD-10-CM | POA: Diagnosis not present

## 2022-07-19 DIAGNOSIS — J9690 Respiratory failure, unspecified, unspecified whether with hypoxia or hypercapnia: Secondary | ICD-10-CM | POA: Diagnosis not present

## 2022-07-19 DIAGNOSIS — Z79890 Hormone replacement therapy (postmenopausal): Secondary | ICD-10-CM | POA: Diagnosis not present

## 2022-07-19 DIAGNOSIS — F0280 Dementia in other diseases classified elsewhere without behavioral disturbance: Secondary | ICD-10-CM | POA: Diagnosis present

## 2022-07-19 DIAGNOSIS — J9621 Acute and chronic respiratory failure with hypoxia: Secondary | ICD-10-CM | POA: Diagnosis not present

## 2022-07-19 DIAGNOSIS — Z7951 Long term (current) use of inhaled steroids: Secondary | ICD-10-CM

## 2022-07-19 DIAGNOSIS — I2510 Atherosclerotic heart disease of native coronary artery without angina pectoris: Secondary | ICD-10-CM | POA: Diagnosis not present

## 2022-07-19 DIAGNOSIS — J9811 Atelectasis: Secondary | ICD-10-CM | POA: Diagnosis not present

## 2022-07-19 DIAGNOSIS — R062 Wheezing: Secondary | ICD-10-CM | POA: Diagnosis not present

## 2022-07-19 DIAGNOSIS — Z8249 Family history of ischemic heart disease and other diseases of the circulatory system: Secondary | ICD-10-CM

## 2022-07-19 DIAGNOSIS — G309 Alzheimer's disease, unspecified: Secondary | ICD-10-CM | POA: Diagnosis present

## 2022-07-19 DIAGNOSIS — Z7189 Other specified counseling: Secondary | ICD-10-CM | POA: Diagnosis not present

## 2022-07-19 DIAGNOSIS — Z7401 Bed confinement status: Secondary | ICD-10-CM | POA: Diagnosis not present

## 2022-07-19 DIAGNOSIS — J80 Acute respiratory distress syndrome: Secondary | ICD-10-CM | POA: Diagnosis not present

## 2022-07-19 HISTORY — DX: Pneumonia, unspecified organism: J18.9

## 2022-07-19 LAB — I-STAT ARTERIAL BLOOD GAS, ED
Acid-Base Excess: 3 mmol/L — ABNORMAL HIGH (ref 0.0–2.0)
Bicarbonate: 29 mmol/L — ABNORMAL HIGH (ref 20.0–28.0)
Calcium, Ion: 1.14 mmol/L — ABNORMAL LOW (ref 1.15–1.40)
HCT: 37 % — ABNORMAL LOW (ref 39.0–52.0)
Hemoglobin: 12.6 g/dL — ABNORMAL LOW (ref 13.0–17.0)
O2 Saturation: 96 %
Potassium: 4 mmol/L (ref 3.5–5.1)
Sodium: 135 mmol/L (ref 135–145)
TCO2: 30 mmol/L (ref 22–32)
pCO2 arterial: 50.7 mmHg — ABNORMAL HIGH (ref 32–48)
pH, Arterial: 7.365 (ref 7.35–7.45)
pO2, Arterial: 87 mmHg (ref 83–108)

## 2022-07-19 LAB — COMPREHENSIVE METABOLIC PANEL
ALT: 23 U/L (ref 0–44)
AST: 32 U/L (ref 15–41)
Albumin: 3.5 g/dL (ref 3.5–5.0)
Alkaline Phosphatase: 79 U/L (ref 38–126)
Anion gap: 14 (ref 5–15)
BUN: 35 mg/dL — ABNORMAL HIGH (ref 8–23)
CO2: 25 mmol/L (ref 22–32)
Calcium: 8.8 mg/dL — ABNORMAL LOW (ref 8.9–10.3)
Chloride: 99 mmol/L (ref 98–111)
Creatinine, Ser: 1.52 mg/dL — ABNORMAL HIGH (ref 0.61–1.24)
GFR, Estimated: 44 mL/min — ABNORMAL LOW (ref >60)
Glucose, Bld: 124 mg/dL — ABNORMAL HIGH (ref 70–99)
Potassium: 3.9 mmol/L (ref 3.5–5.1)
Sodium: 138 mmol/L (ref 135–145)
Total Bilirubin: 0.6 mg/dL (ref 0.3–1.2)
Total Protein: 7.2 g/dL (ref 6.5–8.1)

## 2022-07-19 LAB — CBC WITH DIFFERENTIAL/PLATELET
Abs Immature Granulocytes: 0.03 10*3/uL (ref 0.00–0.07)
Basophils Absolute: 0 10*3/uL (ref 0.0–0.1)
Basophils Relative: 0 %
Eosinophils Absolute: 0 10*3/uL (ref 0.0–0.5)
Eosinophils Relative: 0 %
HCT: 40.5 % (ref 39.0–52.0)
Hemoglobin: 13.8 g/dL (ref 13.0–17.0)
Immature Granulocytes: 0 %
Lymphocytes Relative: 15 %
Lymphs Abs: 1.4 10*3/uL (ref 0.7–4.0)
MCH: 36 pg — ABNORMAL HIGH (ref 26.0–34.0)
MCHC: 34.1 g/dL (ref 30.0–36.0)
MCV: 105.7 fL — ABNORMAL HIGH (ref 80.0–100.0)
Monocytes Absolute: 1.2 10*3/uL — ABNORMAL HIGH (ref 0.1–1.0)
Monocytes Relative: 13 %
Neutro Abs: 7 10*3/uL (ref 1.7–7.7)
Neutrophils Relative %: 72 %
Platelets: 191 10*3/uL (ref 150–400)
RBC: 3.83 MIL/uL — ABNORMAL LOW (ref 4.22–5.81)
RDW: 13.7 % (ref 11.5–15.5)
WBC: 9.7 10*3/uL (ref 4.0–10.5)
nRBC: 0 % (ref 0.0–0.2)

## 2022-07-19 LAB — TROPONIN I (HIGH SENSITIVITY): Troponin I (High Sensitivity): 24 ng/L — ABNORMAL HIGH (ref <18)

## 2022-07-19 LAB — BRAIN NATRIURETIC PEPTIDE: B Natriuretic Peptide: 84.6 pg/mL (ref 0.0–100.0)

## 2022-07-19 MED ORDER — IPRATROPIUM-ALBUTEROL 0.5-2.5 (3) MG/3ML IN SOLN
3.0000 mL | Freq: Once | RESPIRATORY_TRACT | Status: AC
  Administered 2022-07-19: 3 mL via RESPIRATORY_TRACT
  Filled 2022-07-19: qty 3

## 2022-07-19 MED ORDER — SODIUM CHLORIDE 0.9 % IV SOLN
1.0000 g | Freq: Once | INTRAVENOUS | Status: AC
  Administered 2022-07-19: 1 g via INTRAVENOUS
  Filled 2022-07-19: qty 10

## 2022-07-19 MED ORDER — SODIUM CHLORIDE 0.9 % IV SOLN
500.0000 mg | Freq: Once | INTRAVENOUS | Status: AC
  Administered 2022-07-19: 500 mg via INTRAVENOUS
  Filled 2022-07-19: qty 5

## 2022-07-19 NOTE — Sepsis Progress Note (Signed)
Following for sepsis monitoring ?

## 2022-07-19 NOTE — ED Triage Notes (Signed)
Pt arrived from [facility] nursing home c/o SOB. Pt given Deuo neb in route, no relief. Pt 98% on 2LNC. Pt a/ox3, pts baseline. Pt denies CP/ SOB at this time.

## 2022-07-19 NOTE — ED Notes (Signed)
Attempt to stick pt for labs, unsuccessful. Phlebotomy notified.

## 2022-07-19 NOTE — H&P (Signed)
[facility] History and Physical  Billy Smith [id number] DOB: Nov 20, 1932 DOA: 07/19/2022   PCP: Nicoletta Dress, MD  Specialists: None  Chief Complaint: Shortness of breath  HPI: Billy Smith is a 88 y.o. male with past medical history of chronic diastolic CHF, history of COPD not on oxygen at home, coronary artery disease status post CABG, atrial flutter on Eliquis who was hospitalized back in September for acute respiratory failure with hypercapnia.  Noted to have aspiration pneumonia at that time.  According to the daughter who was at the bedside patient was hospitalized prior to that at [facility]  Patient is currently in a skilled nursing facility.  He was in his usual state of health till 2 days ago when patient's daughter noticed that patient was developing a cough.  This worsened in the last 24 hours.  He became [place] of breath.  Oxygen levels were noted to be low.  He was brought into the emergency department.  Patient is very lethargic.  Unable to answer any questions.  At baseline he usually is able to answer questions and ambulate with a walker according to his daughter.  But she has noticed that he has been gradually declining.  He has had setbacks requiring hospitalization which sets him back even further.  During his hospitalization in September it looks like he was treated with meropenem followed by ceftriaxone.  In the skilled nursing facility it looks like he received a course of Augmentin and then followed by levofloxacin most recently.  In the emergency department he was noted to have a low-grade fever.  He was noted to be wheezing and had crackles in his lungs.  Chest x-ray raised concern for pneumonia in the left lung.  He was given antibiotics and nebulizer treatment.  Home Medications: Prior to Admission medications   Medication Sig Start Date End Date Taking? Authorizing Provider  albuterol (VENTOLIN HFA) 108 (90 Base) MCG/ACT inhaler Inhale 2 puffs into the  lungs every 6 (six) hours as needed for wheezing or shortness of breath.    [provider]  apixaban (ELIQUIS) 5 MG TABS tablet Take 5 mg by mouth 2 (two) times daily. 04/17/22   [provider]  atorvastatin (LIPITOR) 40 MG tablet Take 40 mg by mouth at bedtime. 02/03/17   [provider]  bimatoprost (LUMIGAN) 0.01 % SOLN Place 1 drop into both eyes at bedtime. 01/27/22   [provider]  brimonidine (ALPHAGAN) 0.15 % ophthalmic solution Place 1 drop into both eyes 2 (two) times daily. 02/19/20   [provider]  budesonide (PULMICORT) 0.5 MG/2ML nebulizer solution Inhale 2 mLs into the lungs 2 (two) times daily. 04/17/22   [provider]  calcium-vitamin D (OSCAL WITH D) 500-5 MG-MCG tablet Take 1 tablet by mouth 2 (two) times daily.    [provider]  Cholecalciferol 125 MCG (5000 UT) TABS Take 5,000 Units by mouth daily. 04/18/22   [provider]  dorzolamide-timolol (COSOPT) 22.3-6.8 MG/ML ophthalmic solution Place 1 drop into the right eye 2 (two) times daily. 02/16/20   [provider]  empagliflozin (JARDIANCE) 10 MG TABS tablet Take 1 tablet (10 mg total) by mouth daily. 05/10/22   Arrien, Jimmy Picket, MD  FOLTABS 800 800-10-115 MCG-MG-MCG TABS Take 1 tablet by mouth daily. 11/27/16   [provider]  guaiFENesin (ROBITUSSIN) 100 MG/5ML liquid Take 10 mLs by mouth every 6 (six) hours as needed for cough.    [provider]  ipratropium-albuterol (DUONEB)  0.5-2.5 (3) MG/3ML SOLN Inhale 3 mLs into the lungs 3 (three) times daily. 04/17/22   [provider]  levothyroxine (SYNTHROID, LEVOTHROID) 125 MCG tablet Take 125 mcg by mouth daily.    [provider]  primidone (MYSOLINE) 250 MG tablet Take 1 tablet (250 mg total) by mouth every morning. 05/10/22   Arrien, Jimmy Picket, MD  primidone 125 MG TABS Take 125 mg by mouth daily at 6 PM. 05/09/22   Arrien, Jimmy Picket, MD   propranolol (INDERAL) 20 MG tablet Take 1 tablet (20 mg total) by mouth 2 (two) times daily. 05/09/22 06/08/22  Arrien, Jimmy Picket, MD  senna (SENOKOT) 8.6 MG tablet Take 1 tablet by mouth every 12 (twelve) hours as needed for constipation.    [provider]  spironolactone (ALDACTONE) 25 MG tablet Take 0.5 tablets (12.5 mg total) by mouth daily. 05/10/22 06/09/22  Arrien, Jimmy Picket, MD  torsemide 40 MG TABS Take 40 mg by mouth 2 (two) times daily. 05/09/22 06/08/22  Arrien, Jimmy Picket, MD    Allergies: No Known Allergies  Past Medical History: Past Medical History:  Diagnosis Date   Abnormal involuntary movements(781.0)    CAD (coronary artery disease)    with 4V CABG 2004   Chronic airway obstruction, not elsewhere classified    Nephrolithiasis    Obstructive sleep apnea (adult) (pediatric)    Venous insufficiency     Past Surgical History:  Procedure Laterality Date   CORONARY ARTERY BYPASS GRAFT  2004    Social History: Currently in a skilled nursing facility.  Unable to provide much information at this time.  Family History:  Family History  Problem Relation Age of Onset   Hypertension Mother    CAD Brother    Valvular heart disease Brother    Hypertension Brother    Heart attack Neg Hx    Stroke Neg Hx      Review of Systems -unable to do due to lethargy and altered mental status  Physical Examination  Vitals:   07/19/22 2300 07/20/22 0000 07/20/22 0045 07/20/22 0046  BP: 139/81 (!) 143/80    Pulse: (!) 102 (!) 105 (!) 105   Resp: (!) 21 (!) 22 19   Temp:    (!) 100.5 F (38.1 C)  TempSrc:    Rectal  SpO2: 100% 100% 99%   Weight:      Height:        BP (!) 143/80   Pulse (!) 105   Temp (!) 100.5 F (38.1 C) (Rectal)   Resp 19   Ht 5\' 5"  (1.651 m)   Wt 103 kg   SpO2 99%   BMI 37.79 kg/m   General appearance: alert, distracted, and no distress Head: Normocephalic, without obvious abnormality, atraumatic Eyes:  conjunctivae/corneas clear. PERRL. Throat: lips, mucosa, and tongue normal; teeth and gums normal Neck: no adenopathy, no carotid bruit, no JVD, supple, symmetrical, trachea midline, and thyroid not enlarged, symmetric, no tenderness/mass/nodules Resp: Tachypneic.  Coarse breath sounds bilaterally with wheezing mostly in the right lung.  Crackles bilateral bases left greater than right. Cardio: S1-S2 is irregularly irregular GI: soft, non-tender; bowel sounds normal; no masses,  no organomegaly Extremities: No significant edema bilateral lower extremities Pulses: 2+ and symmetric Skin: Skin color, texture, turgor normal. No rashes or lesions Lymph nodes: Cervical, supraclavicular, and axillary nodes normal. Neurologic: Lethargic.  No obvious focal neurological deficits.   Labs on Admission: I have personally reviewed following labs and imaging studies  CBC: Recent  Labs  Lab 07/19/22 2129 07/19/22 2327  WBC 9.7  --   NEUTROABS 7.0  --   HGB 13.8 12.6*  HCT 40.5 37.0*  MCV 105.7*  --   PLT 191  --    Basic Metabolic Panel: Recent Labs  Lab 07/19/22 2129 07/19/22 2327  NA 138 135  K 3.9 4.0  CL 99  --   CO2 25  --   GLUCOSE 124*  --   BUN 35*  --   CREATININE 1.52*  --   CALCIUM 8.8*  --    GFR: Estimated Creatinine Clearance: 37.8 mL/min (A) (by C-G formula based on SCr of 1.52 mg/dL (H)). Liver Function Tests: Recent Labs  Lab 07/19/22 2129  AST 32  ALT 23  ALKPHOS 79  BILITOT 0.6  PROT 7.2  ALBUMIN 3.5     Radiological Exams on Admission: DG Chest Portable 1 View  Result Date: 07/19/2022 CLINICAL DATA:  sob EXAM: PORTABLE CHEST 1 VIEW COMPARISON:  Chest x-ray 05/03/2022. CT chest 05/01/2022 FINDINGS: The heart and mediastinal contours are unchanged. Aortic calcification. Persistent retrocardiac airspace opacity with associated elevated left hemidiaphragm. No pulmonary edema. Likely trace left pleural effusion. No right pleural effusion. No pneumothorax. No  acute osseous abnormality.  Intact sternotomy wires IMPRESSION: 1. Persistent retrocardiac airspace opacity with associated elevated left hemidiaphragm. 2. Likely trace left pleural effusion. 3.  Aortic Atherosclerosis (ICD10-I70.0). Electronically Signed   By: Iven Finn M.D.   On: 07/19/2022 22:05    My interpretation of Electrocardiogram: EKG suggests atrial fibrillation 115 bpm.  No obvious ischemic changes noted.   Problem List  Principal Problem:   HCAP (healthcare-associated pneumonia) Active Problems:   Acute metabolic encephalopathy   Atrial flutter (HCC)   COPD (chronic obstructive pulmonary disease) (HCC)   Coronary artery disease   Acute respiratory failure with hypoxia (HCC)   Chronic diastolic CHF (congestive heart failure) ([place])   Assessment: This is a 88 year old Caucasian male with past medical history as stated earlier who is brought in from skilled nursing facility with shortness of breath and noted to be hypoxia.  He is wheezing and has abnormalities in the left lung suggesting pneumonia.  He has had multiple courses of antibiotics recently.  He likely has healthcare associated pneumonia along with COPD exacerbation.  Venous thromboembolism was thought to be less likely as the patient is anticoagulated with Eliquis.  Plan:  #1. Acute respiratory failure with hypoxia/healthcare associated pneumonia/COPD with acute exacerbation: He will be hospitalized.  He will be placed on steroids nebulizer treatments.  He will be given broad-spectrum antibiotics with vancomycin and cefepime for now.  Follow-up with blood cultures.  Will check procalcitonin levels.  ABG shows that he is got compensated respiratory failure.  No need for BiPAP at this time.  Patient is DNR.  COVID-19 and influenza PCR negative.  #2.  Acute metabolic encephalopathy: Most likely due to acute illness.  No concern for stroke at this time.  Continue to monitor closely.  #3.  Acute kidney injury: BUN and  creatinine noted to be elevated.  His baseline creatinine is 1.0.  Creatinine currently is 1.52.  Could be an element of hypovolemia.  Gentle IV hydration for 12 hours and recheck labs.  #4.  History of atrial flutter/atrial fibrillation: Monitor on telemetry.  Continue Eliquis.  #5.  Coronary artery disease status post CABG: Appears to be stable.  #6. Chronic diastolic CHF: Takes torsemide at skilled nursing facility.  Dose was recently increased to 60 mg  once a day.  BNP is noted to be normal.  His BUN and creatinine are elevated.  Hold diuretics for now.  #7.  Concern for aspiration: Seen by speech therapy during previous hospitalization.  He was initially on dysphagia 2 diet and then advance to regular diet.  Will request speech to see him again as he is noted to be lethargic and might have decline since his previous assessment.  #8 Goals of care: Discussed with patient's daughter.  He is already a DNR.  Discussed recent multiple hospitalizations.  She is agreeable to palliative care consult for goals of care.  DVT Prophylaxis: On Eliquis Code Status: DNR Family Communication: Discussed with patient's daughter Disposition: Hopefully return to SNF when improved Consults called: None Admission Status: Inpatient secondary to acute respiratory failure with hypoxia   Severity of Illness: The appropriate patient status for this patient is INPATIENT. Inpatient status is judged to be reasonable and necessary in order to provide the required intensity of service to ensure the patient's safety. The patient's presenting symptoms, physical exam findings, and initial radiographic and laboratory data in the context of their chronic comorbidities is felt to place them at high risk for further clinical deterioration. Furthermore, it is not anticipated that the patient will be medically stable for discharge from the hospital within 2 midnights of admission.   * I certify that at the point of admission it  is my clinical judgment that the patient will require inpatient hospital care spanning beyond 2 midnights from the point of admission due to high intensity of service, high risk for further deterioration and high frequency of surveillance required.*   Further management decisions will depend on results of further testing and patient's response to treatment.   Billy Smith [facility]  [facility] on [email or web address]  07/20/2022, 1:07 AM

## 2022-07-19 NOTE — ED Provider Notes (Signed)
[facility] EMERGENCY DEPARTMENT Provider Note   CSN: [id number] Arrival date & time: 07/19/22  2127     History  Chief Complaint  Patient presents with   Shortness of Breath    Billy Smith is a 88 y.o. male.  Patient here with shortness of breath, cough, respiratory distress.  Comes from nursing facility.  History of atrial flutter on Eliquis.  History of COPD not normally on oxygen.  He has been hypoxic in the 80s and placed on 2 L of oxygen.  Was treated for recent pneumonia supposedly.  Has a history of heart failure as well.  History of CAD.  Patient is hard of hearing and overall difficult to get a history from.  Family member at the bedside states that he has been more sleepy recently especially tonight.  Patient has a DNR.  Family concern for ongoing pneumonia.  He still been coughing and wheezing.  He got breathing treatment and steroids with EMS.  He finished antibiotics recently.  The history is provided by the patient.       Home Medications Prior to Admission medications   Medication Sig Start Date End Date Taking? Authorizing Provider  albuterol (VENTOLIN HFA) 108 (90 Base) MCG/ACT inhaler Inhale 2 puffs into the lungs every 6 (six) hours as needed for wheezing or shortness of breath.    [provider]  apixaban (ELIQUIS) 5 MG TABS tablet Take 5 mg by mouth 2 (two) times daily. 04/17/22   [provider]  atorvastatin (LIPITOR) 40 MG tablet Take 40 mg by mouth at bedtime. 02/03/17   [provider]  bimatoprost (LUMIGAN) 0.01 % SOLN Place 1 drop into both eyes at bedtime. 01/27/22   [provider]  brimonidine (ALPHAGAN) 0.15 % ophthalmic solution Place 1 drop into both eyes 2 (two) times daily. 02/19/20   [provider]  budesonide (PULMICORT) 0.5 MG/2ML nebulizer solution Inhale 2 mLs into the lungs 2 (two) times daily. 04/17/22   [provider]  calcium-vitamin D (OSCAL WITH D) 500-5 MG-MCG  tablet Take 1 tablet by mouth 2 (two) times daily.    [provider]  Cholecalciferol 125 MCG (5000 UT) TABS Take 5,000 Units by mouth daily. 04/18/22   [provider]  dorzolamide-timolol (COSOPT) 22.3-6.8 MG/ML ophthalmic solution Place 1 drop into the right eye 2 (two) times daily. 02/16/20   [provider]  empagliflozin (JARDIANCE) 10 MG TABS tablet Take 1 tablet (10 mg total) by mouth daily. 05/10/22   Arrien, York Ram, MD  FOLTABS 800 800-10-115 MCG-MG-MCG TABS Take 1 tablet by mouth daily. 11/27/16   [provider]  guaiFENesin (ROBITUSSIN) 100 MG/5ML liquid Take 10 mLs by mouth every 6 (six) hours as needed for cough.    [provider]  ipratropium-albuterol (DUONEB) 0.5-2.5 (3) MG/3ML SOLN Inhale 3 mLs into the lungs 3 (three) times daily. 04/17/22   [provider]  levothyroxine (SYNTHROID, LEVOTHROID) 125 MCG tablet Take 125 mcg by mouth daily.    [provider]  primidone (MYSOLINE) 250 MG tablet Take 1 tablet (250 mg total) by mouth every morning. 05/10/22   Arrien, York Ram, MD  primidone 125 MG TABS Take 125 mg by mouth daily at 6 PM. 05/09/22   Arrien, York Ram, MD  propranolol (INDERAL) 20 MG tablet Take 1 tablet (20 mg total) by mouth 2 (two) times daily. 05/09/22 06/08/22  Arrien, York Ram, MD  senna (SENOKOT) 8.6 MG tablet Take 1 tablet by  mouth every 12 (twelve) hours as needed for constipation.    [provider]  spironolactone (ALDACTONE) 25 MG tablet Take 0.5 tablets (12.5 mg total) by mouth daily. 05/10/22 06/09/22  Arrien, Jimmy Picket, MD  torsemide 40 MG TABS Take 40 mg by mouth 2 (two) times daily. 05/09/22 06/08/22  Arrien, Jimmy Picket, MD      Allergies    Patient has no known allergies.    Review of Systems   Review of Systems  Physical Exam Updated Vital Signs BP 139/81   Pulse (!) 102   Resp (!) 21   Ht 5\' 5"  (1.651 m)   Wt 103 kg   SpO2 100%   BMI  37.79 kg/m  Physical Exam Vitals and nursing note reviewed.  Constitutional:      General: He is not in acute distress.    Appearance: He is well-developed. He is ill-appearing.  HENT:     Head: Normocephalic and atraumatic.     Mouth/Throat:     Mouth: Mucous membranes are moist.  Eyes:     Conjunctiva/sclera: Conjunctivae normal.     Pupils: Pupils are equal, round, and reactive to light.  Cardiovascular:     Rate and Rhythm: Tachycardia present. Rhythm irregular.     Heart sounds: No murmur heard. Pulmonary:     Effort: Tachypnea present. No respiratory distress.     Breath sounds: Decreased breath sounds and wheezing present.  Abdominal:     Palpations: Abdomen is soft.     Tenderness: There is no abdominal tenderness.  Musculoskeletal:        General: No swelling.     Cervical back: Neck supple.  Skin:    General: Skin is warm and dry.     Capillary Refill: Capillary refill takes less than 2 seconds.  Neurological:     General: No focal deficit present.     Mental Status: He is alert.  Psychiatric:        Mood and Affect: Mood normal.     ED Results / Procedures / Treatments   Labs (all labs ordered are listed, but only abnormal results are displayed) Labs Reviewed  CBC WITH DIFFERENTIAL/PLATELET - Abnormal; Notable for the following components:      Result Value   RBC 3.83 (*)    MCV 105.7 (*)    MCH 36.0 (*)    Monocytes Absolute 1.2 (*)    All other components within normal limits  COMPREHENSIVE METABOLIC PANEL - Abnormal; Notable for the following components:   Glucose, Bld 124 (*)    BUN 35 (*)    Creatinine, Ser 1.52 (*)    Calcium 8.8 (*)    GFR, Estimated 44 (*)    All other components within normal limits  I-STAT ARTERIAL BLOOD GAS, ED - Abnormal; Notable for the following components:   pCO2 arterial 50.7 (*)    Bicarbonate 29.0 (*)    Acid-Base Excess 3.0 (*)    Calcium, Ion 1.14 (*)    HCT 37.0 (*)    Hemoglobin 12.6 (*)    All other  components within normal limits  TROPONIN I (HIGH SENSITIVITY) - Abnormal; Notable for the following components:   Troponin I (High Sensitivity) 24 (*)    All other components within normal limits  RESP PANEL BY RT-PCR (FLU A&B, COVID) ARPGX2  CULTURE, BLOOD (ROUTINE X 2)  CULTURE, BLOOD (ROUTINE X 2)  URINE CULTURE  BRAIN NATRIURETIC PEPTIDE  LACTIC ACID, PLASMA  LACTIC ACID, PLASMA  URINALYSIS, ROUTINE W REFLEX MICROSCOPIC  TROPONIN I (HIGH SENSITIVITY)    EKG None  Radiology DG Chest Portable 1 View  Result Date: 07/19/2022 CLINICAL DATA:  sob EXAM: PORTABLE CHEST 1 VIEW COMPARISON:  Chest x-ray 05/03/2022. CT chest 05/01/2022 FINDINGS: The heart and mediastinal contours are unchanged. Aortic calcification. Persistent retrocardiac airspace opacity with associated elevated left hemidiaphragm. No pulmonary edema. Likely trace left pleural effusion. No right pleural effusion. No pneumothorax. No acute osseous abnormality.  Intact sternotomy wires IMPRESSION: 1. Persistent retrocardiac airspace opacity with associated elevated left hemidiaphragm. 2. Likely trace left pleural effusion. 3.  Aortic Atherosclerosis (ICD10-I70.0). Electronically Signed   By: Tish Frederickson M.D.   On: 07/19/2022 22:05    Procedures .Critical Care  Performed by: Virgina Norfolk, DO Authorized by: Virgina Norfolk, DO   Critical care provider statement:    Critical care time (minutes):  35   Critical care was necessary to treat or prevent imminent or life-threatening deterioration of the following conditions:  Respiratory failure   Critical care was time spent personally by me on the following activities:  Blood draw for specimens, development of treatment plan with patient or surrogate, discussions with primary provider, evaluation of patient's response to treatment, examination of patient, obtaining history from patient or surrogate, ordering and performing treatments and interventions, ordering and review of  radiographic studies, ordering and review of laboratory studies, pulse oximetry, re-evaluation of patient's condition and review of old charts   I assumed direction of critical care for this patient from another provider in my specialty: no     Care discussed with: admitting provider       Medications Ordered in ED Medications  cefTRIAXone (ROCEPHIN) 1 g in sodium chloride 0.9 % 100 mL IVPB (1 g Intravenous New Bag/Given 07/19/22 2316)  azithromycin (ZITHROMAX) 500 mg in sodium chloride 0.9 % 250 mL IVPB (500 mg Intravenous New Bag/Given 07/19/22 2319)  ipratropium-albuterol (DUONEB) 0.5-2.5 (3) MG/3ML nebulizer solution 3 mL (3 mLs Nebulization Given 07/19/22 2307)    ED Course/ Medical Decision Making/ A&P                           Medical Decision Making Amount and/or Complexity of Data Reviewed Labs: ordered. Radiology: ordered.  Risk Prescription drug management. Decision regarding hospitalization.   Lejuan Botto Stefanski is here with shortness of breath.  Patient arrives on 2 L of oxygen.  Does not normally wear oxygen.  Does get hypoxic when oxygen is turned off.  He appears to be in atrial fibrillation with heart rate in the low 100s.  Temperature is 99.7 orally.  Overall concern for sepsis given recent pneumonia.  Differential diagnosis includes pneumonia, volume overload, viral process.  He has cough on exam with coarse breath sounds and wheezing.  He is already been given DuoNebs and steroids with EMS.  Will give him another DuoNeb here and will start IV antibiotics and sepsis workup with blood cultures, chest x-ray, lactic acid and labs.  Will pursue cardiac workup as well with BNP and troponin.  EKG per my review and interpretation shows atrial fibrillation in the low 100s.  No ischemic changes.  Anticipate admission for respiratory failure and suspect that this is multifactorial.  Chest x-ray per my review shows may be pneumonia, radiology report also suspects persistent  retrocardiac airspace opacity.  My suspicion is that this is infectious given history and physical.  Per my review of labs thus far he does not  have a leukocytosis however.Remaining labs still pending.  Per my review of blood gas pH 7.3, bicarb 50.  Does not seem to be having any major hypercarbia.  Creatinine is 1.5.  Troponin 24.  Overall, my suspicion is for respiratory failure in the setting of COPD exacerbation, pneumonia/sepsis.  This is causing some encephalopathy.  Patient is DNR.  Will admit for further care with medicine.     This chart was dictated using voice recognition software.  Despite best efforts to proofread,  errors can occur which can change the documentation meaning.         Final Clinical Impression(s) / ED Diagnoses Final diagnoses:  COPD exacerbation ([place])  Acute respiratory failure with hypoxia ([place])  Community acquired pneumonia, unspecified laterality    Rx / DC Orders ED Discharge Orders     None         Lennice Sites, DO 07/19/22 2339

## 2022-07-20 ENCOUNTER — Inpatient Hospital Stay (HOSPITAL_COMMUNITY): Payer: PPO

## 2022-07-20 ENCOUNTER — Encounter (HOSPITAL_COMMUNITY): Payer: Self-pay | Admitting: Internal Medicine

## 2022-07-20 DIAGNOSIS — J189 Pneumonia, unspecified organism: Secondary | ICD-10-CM

## 2022-07-20 DIAGNOSIS — Y95 Nosocomial condition: Secondary | ICD-10-CM | POA: Diagnosis present

## 2022-07-20 DIAGNOSIS — N179 Acute kidney failure, unspecified: Secondary | ICD-10-CM | POA: Diagnosis present

## 2022-07-20 DIAGNOSIS — E039 Hypothyroidism, unspecified: Secondary | ICD-10-CM | POA: Diagnosis present

## 2022-07-20 DIAGNOSIS — I5032 Chronic diastolic (congestive) heart failure: Secondary | ICD-10-CM | POA: Diagnosis not present

## 2022-07-20 DIAGNOSIS — G4733 Obstructive sleep apnea (adult) (pediatric): Secondary | ICD-10-CM | POA: Diagnosis present

## 2022-07-20 DIAGNOSIS — G250 Essential tremor: Secondary | ICD-10-CM | POA: Diagnosis present

## 2022-07-20 DIAGNOSIS — I5031 Acute diastolic (congestive) heart failure: Secondary | ICD-10-CM | POA: Diagnosis not present

## 2022-07-20 DIAGNOSIS — J441 Chronic obstructive pulmonary disease with (acute) exacerbation: Secondary | ICD-10-CM

## 2022-07-20 DIAGNOSIS — R54 Age-related physical debility: Secondary | ICD-10-CM | POA: Diagnosis present

## 2022-07-20 DIAGNOSIS — Z79890 Hormone replacement therapy (postmenopausal): Secondary | ICD-10-CM | POA: Diagnosis not present

## 2022-07-20 DIAGNOSIS — Z6837 Body mass index (BMI) 37.0-37.9, adult: Secondary | ICD-10-CM | POA: Diagnosis not present

## 2022-07-20 DIAGNOSIS — Z66 Do not resuscitate: Secondary | ICD-10-CM | POA: Diagnosis present

## 2022-07-20 DIAGNOSIS — J9601 Acute respiratory failure with hypoxia: Secondary | ICD-10-CM | POA: Insufficient documentation

## 2022-07-20 DIAGNOSIS — Z7189 Other specified counseling: Secondary | ICD-10-CM | POA: Diagnosis not present

## 2022-07-20 DIAGNOSIS — I5033 Acute on chronic diastolic (congestive) heart failure: Secondary | ICD-10-CM | POA: Diagnosis present

## 2022-07-20 DIAGNOSIS — I2510 Atherosclerotic heart disease of native coronary artery without angina pectoris: Secondary | ICD-10-CM | POA: Diagnosis present

## 2022-07-20 DIAGNOSIS — I480 Paroxysmal atrial fibrillation: Secondary | ICD-10-CM | POA: Diagnosis present

## 2022-07-20 DIAGNOSIS — F0280 Dementia in other diseases classified elsewhere without behavioral disturbance: Secondary | ICD-10-CM | POA: Diagnosis present

## 2022-07-20 DIAGNOSIS — Z7901 Long term (current) use of anticoagulants: Secondary | ICD-10-CM | POA: Diagnosis not present

## 2022-07-20 DIAGNOSIS — I110 Hypertensive heart disease with heart failure: Secondary | ICD-10-CM | POA: Diagnosis present

## 2022-07-20 DIAGNOSIS — J440 Chronic obstructive pulmonary disease with acute lower respiratory infection: Secondary | ICD-10-CM | POA: Diagnosis present

## 2022-07-20 DIAGNOSIS — G9341 Metabolic encephalopathy: Secondary | ICD-10-CM | POA: Diagnosis present

## 2022-07-20 DIAGNOSIS — J9621 Acute and chronic respiratory failure with hypoxia: Secondary | ICD-10-CM | POA: Diagnosis present

## 2022-07-20 DIAGNOSIS — Z5181 Encounter for therapeutic drug level monitoring: Secondary | ICD-10-CM | POA: Diagnosis not present

## 2022-07-20 DIAGNOSIS — I4892 Unspecified atrial flutter: Secondary | ICD-10-CM | POA: Diagnosis present

## 2022-07-20 DIAGNOSIS — Z515 Encounter for palliative care: Secondary | ICD-10-CM | POA: Diagnosis not present

## 2022-07-20 DIAGNOSIS — G309 Alzheimer's disease, unspecified: Secondary | ICD-10-CM | POA: Diagnosis present

## 2022-07-20 DIAGNOSIS — I272 Pulmonary hypertension, unspecified: Secondary | ICD-10-CM | POA: Diagnosis present

## 2022-07-20 DIAGNOSIS — R1310 Dysphagia, unspecified: Secondary | ICD-10-CM | POA: Diagnosis present

## 2022-07-20 DIAGNOSIS — Z20822 Contact with and (suspected) exposure to covid-19: Secondary | ICD-10-CM | POA: Diagnosis present

## 2022-07-20 LAB — URINALYSIS, ROUTINE W REFLEX MICROSCOPIC
Bilirubin Urine: NEGATIVE
Glucose, UA: 50 mg/dL — AB
Hgb urine dipstick: NEGATIVE
Ketones, ur: NEGATIVE mg/dL
Leukocytes,Ua: NEGATIVE
Nitrite: NEGATIVE
Protein, ur: NEGATIVE mg/dL
Specific Gravity, Urine: 1.011 (ref 1.005–1.030)
pH: 5 (ref 5.0–8.0)

## 2022-07-20 LAB — BASIC METABOLIC PANEL
Anion gap: 14 (ref 5–15)
BUN: 35 mg/dL — ABNORMAL HIGH (ref 8–23)
CO2: 29 mmol/L (ref 22–32)
Calcium: 8.6 mg/dL — ABNORMAL LOW (ref 8.9–10.3)
Chloride: 97 mmol/L — ABNORMAL LOW (ref 98–111)
Creatinine, Ser: 1.38 mg/dL — ABNORMAL HIGH (ref 0.61–1.24)
GFR, Estimated: 49 mL/min — ABNORMAL LOW (ref >60)
Glucose, Bld: 139 mg/dL — ABNORMAL HIGH (ref 70–99)
Potassium: 4.1 mmol/L (ref 3.5–5.1)
Sodium: 140 mmol/L (ref 135–145)

## 2022-07-20 LAB — CBC
HCT: 35 % — ABNORMAL LOW (ref 39.0–52.0)
Hemoglobin: 12.2 g/dL — ABNORMAL LOW (ref 13.0–17.0)
MCH: 36.3 pg — ABNORMAL HIGH (ref 26.0–34.0)
MCHC: 34.9 g/dL (ref 30.0–36.0)
MCV: 104.2 fL — ABNORMAL HIGH (ref 80.0–100.0)
Platelets: 153 10*3/uL (ref 150–400)
RBC: 3.36 MIL/uL — ABNORMAL LOW (ref 4.22–5.81)
RDW: 13.7 % (ref 11.5–15.5)
WBC: 8.7 10*3/uL (ref 4.0–10.5)
nRBC: 0 % (ref 0.0–0.2)

## 2022-07-20 LAB — BLOOD GAS, ARTERIAL
Acid-Base Excess: 4.7 mmol/L — ABNORMAL HIGH (ref 0.0–2.0)
Bicarbonate: 32 mmol/L — ABNORMAL HIGH (ref 20.0–28.0)
O2 Saturation: 99.2 %
Patient temperature: 36.4
pCO2 arterial: 56 mmHg — ABNORMAL HIGH (ref 32–48)
pH, Arterial: 7.36 (ref 7.35–7.45)
pO2, Arterial: 90 mmHg (ref 83–108)

## 2022-07-20 LAB — LACTIC ACID, PLASMA: Lactic Acid, Venous: 1.3 mmol/L (ref 0.5–1.9)

## 2022-07-20 LAB — GLUCOSE, CAPILLARY
Glucose-Capillary: 153 mg/dL — ABNORMAL HIGH (ref 70–99)
Glucose-Capillary: 120 mg/dL — ABNORMAL HIGH (ref 70–99)
Glucose-Capillary: 106 mg/dL — ABNORMAL HIGH (ref 70–99)

## 2022-07-20 LAB — RESP PANEL BY RT-PCR (FLU A&B, COVID) ARPGX2
Influenza A by PCR: NEGATIVE
Influenza B by PCR: NEGATIVE
SARS Coronavirus 2 by RT PCR: NEGATIVE

## 2022-07-20 LAB — HEMOGLOBIN A1C
Hgb A1c MFr Bld: 5.9 % — ABNORMAL HIGH (ref 4.8–5.6)
Mean Plasma Glucose: 123 mg/dL

## 2022-07-20 LAB — BRAIN NATRIURETIC PEPTIDE: B Natriuretic Peptide: 79.7 pg/mL (ref 0.0–100.0)

## 2022-07-20 LAB — TROPONIN I (HIGH SENSITIVITY): Troponin I (High Sensitivity): 24 ng/L — ABNORMAL HIGH (ref <18)

## 2022-07-20 LAB — MRSA NEXT GEN BY PCR, NASAL: MRSA by PCR Next Gen: DETECTED — AB

## 2022-07-20 LAB — PROCALCITONIN: Procalcitonin: <0.1 ng/mL

## 2022-07-20 MED ORDER — LATANOPROST 0.005 % OP SOLN
1.0000 [drp] | Freq: Every day | OPHTHALMIC | Status: DC
  Filled 2022-07-20: qty 2.5

## 2022-07-20 MED ORDER — GUAIFENESIN ER 600 MG PO TB12
1200.0000 mg | ORAL_TABLET | Freq: Two times a day (BID) | ORAL | Status: DC
  Administered 2022-07-20 – 2022-07-28 (×16): 1200 mg via ORAL
  Filled 2022-07-20 (×16): qty 2

## 2022-07-20 MED ORDER — FUROSEMIDE 10 MG/ML IJ SOLN
40.0000 mg | Freq: Two times a day (BID) | INTRAMUSCULAR | Status: DC
  Administered 2022-07-20 – 2022-07-22 (×5): 40 mg via INTRAVENOUS
  Filled 2022-07-20 (×5): qty 4

## 2022-07-20 MED ORDER — METHYLPREDNISOLONE SODIUM SUCC 40 MG IJ SOLR
40.0000 mg | Freq: Two times a day (BID) | INTRAMUSCULAR | Status: DC
  Administered 2022-07-20 – 2022-07-27 (×16): 40 mg via INTRAVENOUS
  Filled 2022-07-20 (×16): qty 1

## 2022-07-20 MED ORDER — ONDANSETRON HCL 4 MG/2ML IJ SOLN: 4.0000 mg | Freq: Four times a day (QID) | INTRAMUSCULAR | Status: DC | PRN

## 2022-07-20 MED ORDER — IPRATROPIUM-ALBUTEROL 0.5-2.5 (3) MG/3ML IN SOLN
3.0000 mL | Freq: Three times a day (TID) | RESPIRATORY_TRACT | Status: DC
  Administered 2022-07-20 – 2022-07-28 (×25): 3 mL via RESPIRATORY_TRACT
  Filled 2022-07-20 (×24): qty 3

## 2022-07-20 MED ORDER — FUROSEMIDE 10 MG/ML IJ SOLN: 40.0000 mg | Freq: Every day | INTRAMUSCULAR | Status: DC

## 2022-07-20 MED ORDER — ATORVASTATIN CALCIUM 40 MG PO TABS
40.0000 mg | ORAL_TABLET | Freq: Every day | ORAL | Status: DC
  Administered 2022-07-21 – 2022-07-27 (×7): 40 mg via ORAL
  Filled 2022-07-20 (×7): qty 1

## 2022-07-20 MED ORDER — VANCOMYCIN HCL 750 MG/150ML IV SOLN
750.0000 mg | INTRAVENOUS | Status: AC
  Administered 2022-07-20 – 2022-07-24 (×5): 750 mg via INTRAVENOUS
  Filled 2022-07-20 (×6): qty 150

## 2022-07-20 MED ORDER — BUDESONIDE 0.5 MG/2ML IN SUSP
2.0000 mL | Freq: Two times a day (BID) | RESPIRATORY_TRACT | Status: DC
  Administered 2022-07-20 – 2022-07-28 (×17): 0.5 mg via RESPIRATORY_TRACT
  Filled 2022-07-20 (×19): qty 2

## 2022-07-20 MED ORDER — PRIMIDONE 250 MG PO TABS
125.0000 mg | ORAL_TABLET | Freq: Every day | ORAL | Status: DC
  Administered 2022-07-21 – 2022-07-27 (×7): 125 mg via ORAL
  Filled 2022-07-20 (×10): qty 1

## 2022-07-20 MED ORDER — BIMATOPROST 0.01 % OP SOLN
1.0000 [drp] | Freq: Every day | OPHTHALMIC | Status: DC
  Administered 2022-07-20 – 2022-07-27 (×8): 1 [drp] via OPHTHALMIC
  Filled 2022-07-20: qty 2.5

## 2022-07-20 MED ORDER — ALBUTEROL SULFATE (2.5 MG/3ML) 0.083% IN NEBU
2.5000 mg | INHALATION_SOLUTION | RESPIRATORY_TRACT | Status: DC | PRN
  Administered 2022-07-21: 2.5 mg via RESPIRATORY_TRACT
  Filled 2022-07-20: qty 3

## 2022-07-20 MED ORDER — SODIUM CHLORIDE 0.45 % IV SOLN: INTRAVENOUS | Status: DC

## 2022-07-20 MED ORDER — PRIMIDONE 250 MG PO TABS
250.0000 mg | ORAL_TABLET | Freq: Every morning | ORAL | Status: DC
  Administered 2022-07-20 – 2022-07-28 (×9): 250 mg via ORAL
  Filled 2022-07-20 (×9): qty 1

## 2022-07-20 MED ORDER — SODIUM CHLORIDE 0.9 % IV SOLN
2.0000 g | Freq: Two times a day (BID) | INTRAVENOUS | Status: DC
  Administered 2022-07-20 (×3): 2 g via INTRAVENOUS
  Filled 2022-07-20 (×3): qty 12.5

## 2022-07-20 MED ORDER — DORZOLAMIDE HCL-TIMOLOL MAL 2-0.5 % OP SOLN
1.0000 [drp] | Freq: Two times a day (BID) | OPHTHALMIC | Status: DC
  Administered 2022-07-20 – 2022-07-28 (×17): 1 [drp] via OPHTHALMIC
  Filled 2022-07-20: qty 10

## 2022-07-20 MED ORDER — ONDANSETRON HCL 4 MG PO TABS: 4.0000 mg | ORAL_TABLET | Freq: Four times a day (QID) | ORAL | Status: DC | PRN

## 2022-07-20 MED ORDER — BRIMONIDINE TARTRATE 0.15 % OP SOLN
1.0000 [drp] | Freq: Two times a day (BID) | OPHTHALMIC | Status: DC
  Administered 2022-07-20 – 2022-07-28 (×17): 1 [drp] via OPHTHALMIC
  Filled 2022-07-20: qty 5

## 2022-07-20 MED ORDER — APIXABAN 5 MG PO TABS
5.0000 mg | ORAL_TABLET | Freq: Two times a day (BID) | ORAL | Status: DC
  Administered 2022-07-20 – 2022-07-28 (×16): 5 mg via ORAL
  Filled 2022-07-20 (×16): qty 1

## 2022-07-20 MED ORDER — ACETAMINOPHEN 650 MG RE SUPP: 650.0000 mg | Freq: Four times a day (QID) | RECTAL | Status: DC | PRN

## 2022-07-20 MED ORDER — ACETAMINOPHEN 325 MG PO TABS
650.0000 mg | ORAL_TABLET | Freq: Four times a day (QID) | ORAL | Status: DC | PRN
  Administered 2022-07-21: 650 mg via ORAL
  Filled 2022-07-20 (×2): qty 2

## 2022-07-20 MED ORDER — LEVOTHYROXINE SODIUM 25 MCG PO TABS
125.0000 ug | ORAL_TABLET | Freq: Every day | ORAL | Status: DC
  Administered 2022-07-20 – 2022-07-28 (×8): 125 ug via ORAL
  Filled 2022-07-20 (×8): qty 1

## 2022-07-20 MED ORDER — SODIUM CHLORIDE 0.9 % IV SOLN
500.0000 mg | INTRAVENOUS | Status: AC
  Administered 2022-07-20 – 2022-07-24 (×5): 500 mg via INTRAVENOUS
  Filled 2022-07-20 (×5): qty 5

## 2022-07-20 MED ORDER — PROPRANOLOL HCL 20 MG PO TABS
20.0000 mg | ORAL_TABLET | Freq: Two times a day (BID) | ORAL | Status: DC
  Administered 2022-07-20 – 2022-07-28 (×16): 20 mg via ORAL
  Filled 2022-07-20 (×19): qty 1

## 2022-07-20 MED ORDER — INSULIN ASPART 100 UNIT/ML IJ SOLN
0.0000 [IU] | Freq: Three times a day (TID) | INTRAMUSCULAR | Status: DC
  Administered 2022-07-20: 3 [IU] via SUBCUTANEOUS
  Administered 2022-07-21: 2 [IU] via SUBCUTANEOUS
  Administered 2022-07-22: 3 [IU] via SUBCUTANEOUS
  Administered 2022-07-23 – 2022-07-24 (×2): 2 [IU] via SUBCUTANEOUS
  Administered 2022-07-25: 3 [IU] via SUBCUTANEOUS
  Administered 2022-07-25 – 2022-07-27 (×3): 2 [IU] via SUBCUTANEOUS
  Administered 2022-07-28: 3 [IU] via SUBCUTANEOUS

## 2022-07-20 NOTE — Progress Notes (Addendum)
Pt arrived on unit from ED. Pt is drowsy and but responsive to voice. Vital signs stable. O2 sat at 99% on 3L. Daughter at bedside with patient. No questions from patient or daughter at this time.

## 2022-07-20 NOTE — Progress Notes (Signed)
Lab notified nurse of at 8:17 am of critical lab value for positive MRSA screen. MD notified.

## 2022-07-20 NOTE — Progress Notes (Signed)
Pharmacy Antibiotic Note  Billy Smith is a 88 y.o. male admitted on 07/19/2022 with pneumonia.  Pharmacy has been consulted for Vancomycin/Cefepime dosing. WBC WNL. Noted bump in Scr.   Plan: Vancomycin 750 mg IV q24h >>>Estimated AUC: 500 Cefepime 2g IV q12h Trend WBC, temp, renal function  F/U infectious work-up Drug levels as indicated   Height: 5\' 5"  (165.1 cm) Weight: 103 kg (227 lb 1.2 oz) IBW/kg (Calculated) : 61.5  Temp (24hrs), Avg:100.5 F (38.1 C), Min:100.5 F (38.1 C), Max:100.5 F (38.1 C)  Recent Labs  Lab 07/19/22 2129  WBC 9.7  CREATININE 1.52*    Estimated Creatinine Clearance: 37.8 mL/min (A) (by C-G formula based on SCr of 1.52 mg/dL (H)).    No Known Allergies  2130, PharmD, BCPS Clinical Pharmacist Phone: [phone]

## 2022-07-20 NOTE — Evaluation (Signed)
Physical Therapy Evaluation Patient Details Name: Billy Smith MRN: [id number] DOB: 09-04-1932 Today's Date: 07/20/2022  History of Present Illness  Pt is an 88 y/o M admitted on 07/19/22 after pt presented from SNF with c/o SOB after developing a cough ~2 days prior. Chest x-ray concerning for PNA in L lung. PMH: chronic diastolic CHF, COPD, CAD s/p CABG, a-flutter on eliquis  Clinical Impression  Pt seen for PT evaluation with pt received asleep in room. Pt requires extra time/encouragement to increase alertness to participate. Pt demonstrates cognitive deficits but is also very HOH. Pt requires max assist +2 for successful supine>sit 2/2 decreased initiation & strength, however pt is able to complete STS with min<>mod assist & step pivot with min assist with RW. Pt tolerates standing ~2 minutes + 2 minutes to void with total assist for urinal management with intermittent cuing for upright posture vs forward flexed as he fatigues. Pt demonstrates BLE "bouncing"/tremulous movements throughout entirety of standing but reports this is chronic. Pt required education/encouragement re: need to sit in recliner vs getting back in bed.  Pt received on CPAP but nurse assisted with placing pt on 2.5L/min via nasal cannula with SpO2 >/= 89% throughout mobility. SpO2 reading as low as 82% when sitting in recliner in care of nurse at end of session, but increasing to 85%, but question accuracy of this 2/2 poor pleth waveform.    Recommendations for follow up therapy are one component of a multi-disciplinary discharge planning process, led by the attending physician.  Recommendations may be updated based on patient status, additional functional criteria and insurance authorization.  Follow Up Recommendations Skilled nursing-[facility] term rehab (<3 hours/day) Can patient physically be transported by private vehicle: No    Assistance Recommended at Discharge Frequent or constant Supervision/Assistance  Patient can  return home with the following  Two people to help with bathing/dressing/bathroom;Two people to help with walking and/or transfers;Help with stairs or ramp for entrance;Direct supervision/assist for medications management;Assist for transportation;Direct supervision/assist for financial management;Assistance with cooking/housework    Equipment Recommendations None recommended by PT  Recommendations for Other Services       Functional Status Assessment Patient has had a recent decline in their functional status and demonstrates the ability to make significant improvements in function in a reasonable and predictable amount of time.     Precautions / Restrictions Precautions Precautions: Fall Restrictions Weight Bearing Restrictions: No      Mobility  Bed Mobility Overal bed mobility: Needs Assistance Bed Mobility: Supine to Sit     Supine to sit: Max assist, +2 for physical assistance, HOB elevated     General bed mobility comments: Max multimodal cuing to initiate & complete supine>sit, poor ability to follow commands to use bed rails to assist.    Transfers Overall transfer level: Needs assistance Equipment used: Rolling walker (2 wheels) Transfers: Sit to/from Stand, Bed to chair/wheelchair/BSC Sit to Stand: Mod assist, Min assist   Step pivot transfers: Min assist       General transfer comment: Poor awareness of safe hand placement, requires cuing to initiate, min<>mod assist for STS from EOB.    Ambulation/Gait                  Stairs            Wheelchair Mobility    Modified Rankin (Stroke Patients Only)       Balance Overall balance assessment: Needs assistance Sitting-balance support: Feet supported, Bilateral upper extremity supported Sitting balance-Leahy Scale:  Fair Sitting balance - Comments: close supervision static sitting   Standing balance support: Bilateral upper extremity supported, During functional activity, Reliant on  assistive device for balance Standing balance-Leahy Scale: Poor                               Pertinent Vitals/Pain Pain Assessment Pain Assessment: No/denies pain    Home Living Family/patient expects to be discharged to:: Skilled nursing facility                   Additional Comments: Information obtained from chart, pt's daughter reports pt was ambulating long distances (~200 ft) with walker. Pt does not provide information.    Prior Function Prior Level of Function : Needs assist             Mobility Comments: Information obtained from chart, pt's daughter reports pt was ambulating long distances (~200 ft) with walker. Pt does not provide information.       Hand Dominance        Extremity/Trunk Assessment   Upper Extremity Assessment Upper Extremity Assessment: Generalized weakness    Lower Extremity Assessment Lower Extremity Assessment: Generalized weakness (pt with continuous "bouncing"/tremulous movements in BLE throughout standing but reports this is chronic.)       Communication   Communication: HOH  Cognition Arousal/Alertness: Awake/alert Behavior During Therapy: WFL for tasks assessed/performed Overall Cognitive Status: Difficult to assess                                 General Comments: Pt received asleep, requiring extensive cuing/encouragement to increase alertness to be able to participate. Pt is very HOH which limits cognitive assessment. Pt appears only oriented to himself. Does report need to void.        General Comments General comments (skin integrity, edema, etc.): Pt with continent void with nurse providing total assist for urinal management & pt requiring extra time to void.    Exercises     Assessment/Plan    PT Assessment Patient needs continued PT services  PT Problem List Decreased strength;Cardiopulmonary status limiting activity;Decreased activity tolerance;Decreased balance;Decreased  mobility;Decreased knowledge of use of DME;Decreased safety awareness;Decreased cognition       PT Treatment Interventions DME instruction;Therapeutic exercise;Gait training;Balance training;Neuromuscular re-education;Functional mobility training;Patient/family education;Therapeutic activities;Cognitive remediation    PT Goals (Current goals can be found in the Care Plan section)  Acute Rehab PT Goals Patient Stated Goal: none stated PT Goal Formulation: With patient Time For Goal Achievement: 08/03/22 Potential to Achieve Goals: Fair    Frequency Min 2X/week     Co-evaluation               AM-PAC PT "6 Clicks" Mobility  Outcome Measure Help needed turning from your back to your side while in a flat bed without using bedrails?: A Lot Help needed moving from lying on your back to sitting on the side of a flat bed without using bedrails?: Total Help needed moving to and from a bed to a chair (including a wheelchair)?: A Lot Help needed standing up from a chair using your arms (e.g., wheelchair or bedside chair)?: A Lot Help needed to walk in hospital room?: A Lot Help needed climbing 3-5 steps with a railing? : Total 6 Click Score: 10    End of Session Equipment Utilized During Treatment: Oxygen Activity Tolerance: Patient tolerated treatment well  Patient left: in chair;with chair alarm set;with call bell/phone within reach;with nursing/sitter in room Nurse Communication: Mobility status PT Visit Diagnosis: Muscle weakness (generalized) (M62.81);Difficulty in walking, not elsewhere classified (R26.2);Unsteadiness on feet (R26.81)    Time: [id number] PT Time Calculation (min) (ACUTE ONLY): 19 min   Charges:   PT Evaluation $PT Eval Moderate Complexity: [place], PT, DPT 07/20/22, 12:24 PM   Waunita Schooner 07/20/2022, 12:21 PM

## 2022-07-20 NOTE — Progress Notes (Signed)
Pt resting comfortably on CPAP.

## 2022-07-20 NOTE — Progress Notes (Addendum)
Report received from Wilhemina Cash, Charity fundraiser.

## 2022-07-20 NOTE — [place] FL2 (Signed)
[place] MEDICAID FL2 LEVEL OF CARE FORM     IDENTIFICATION  Patient Name: Billy Smith Birthdate: [birth year] Sex: male Admission Date (Current Location): 07/19/2022  [place] and [place] Number:  Haynes Bast [id number] Facility and Address:  [facility], [address] [place], [place] [address]      Provider Number: [id number]  Attending Physician Name and Address:  Alba Cory, MD  Relative Name and Phone Number:  Vuncannon,[name] Daughter [phone]    Current Level of Care: Hospital Recommended Level of Care: Skilled Nursing Facility Prior Approval Number:    Date Approved/Denied:   PASRR Number: [id number]  Discharge Plan: SNF    Current Diagnoses: Patient Active Problem List   Diagnosis Date Noted   HCAP (healthcare-associated pneumonia) 07/20/2022   Acute respiratory failure with hypoxia (HCC) 07/20/2022   Chronic diastolic CHF (congestive heart failure) (HCC) 07/20/2022   Aspiration pneumonia (HCC) 05/09/2022   Pressure injury of skin 05/02/2022   Class 2 obesity 05/02/2022   Acute on chronic diastolic CHF (congestive heart failure) (HCC) 05/01/2022   Acute hypercapnic respiratory failure (HCC) 05/01/2022   Acute metabolic encephalopathy 05/01/2022   Atrial flutter (HCC) 05/01/2022   HYPERLIPIDEMIA 05/06/2009   SLEEP APNEA, OBSTRUCTIVE 05/06/2009   Coronary artery disease 05/06/2009   VENOUS INSUFFICIENCY 05/06/2009   COPD (chronic obstructive pulmonary disease) (HCC) 05/06/2009   NEPHROLITHIASIS, HX OF 05/06/2009   CORONARY ARTERY BYPASS GRAFT, HX OF 05/06/2009    Orientation RESPIRATION BLADDER Height & Weight     Self  O2 Continent Weight: 227 lb 1.2 oz (103 kg) Height:  5\' 5"  (165.1 cm)  BEHAVIORAL SYMPTOMS/MOOD NEUROLOGICAL BOWEL NUTRITION STATUS      Continent Diet (see discharge)  AMBULATORY STATUS COMMUNICATION OF NEEDS Skin   Total Care Verbally Normal, Other (Comment) (dry)                        Personal Care Assistance Level of Assistance  Bathing, Feeding, Dressing Bathing Assistance: Maximum assistance Feeding assistance: Limited assistance Dressing Assistance: Maximum assistance     Functional Limitations Info  Sight, Hearing, Speech Sight Info: Adequate Hearing Info: Adequate Speech Info: Adequate    SPECIAL CARE FACTORS FREQUENCY  PT (By licensed PT), OT (By licensed OT)     PT Frequency: 5x week OT Frequency: 5x week            Contractures Contractures Info: Not present    Additional Factors Info  Code Status, Allergies, Insulin Sliding Scale Code Status Info: DNR Allergies Info: Xalatan (Latanoprost)   Insulin Sliding Scale Info: Novolog: see discharge summary       Current Medications (07/20/2022):  This is the current hospital active medication list Current Facility-Administered Medications  Medication Dose Route Frequency Provider Last Rate Last Admin   acetaminophen (TYLENOL) tablet 650 mg  650 mg Oral Q6H PRN 07/22/2022, MD       Or   acetaminophen (TYLENOL) suppository 650 mg  650 mg Rectal Q6H PRN Osvaldo Shipper, MD       albuterol (PROVENTIL) (2.5 MG/3ML) 0.083% nebulizer solution 2.5 mg  2.5 mg Nebulization Q2H PRN Osvaldo Shipper, MD       apixaban Osvaldo Shipper) tablet 5 mg  5 mg Oral BID Everlene Balls, MD   5 mg at 07/20/22 1305   atorvastatin (LIPITOR) tablet 40 mg  40 mg Oral QHS 07/22/22, MD       azithromycin (ZITHROMAX) 500 mg in sodium chloride 0.9 %  250 mL IVPB  500 mg Intravenous Q24H Regalado, Belkys A, MD 250 mL/hr at 07/20/22 1303 500 mg at 07/20/22 1303   bimatoprost (LUMIGAN) 0.01 % ophthalmic solution 1 drop  1 drop Both Eyes QHS Regalado, Belkys A, MD       brimonidine (ALPHAGAN) 0.15 % ophthalmic solution 1 drop  1 drop Both Eyes BID Regalado, Belkys A, MD   1 drop at 07/20/22 1310   budesonide (PULMICORT) nebulizer solution 0.5 mg  2 mL Inhalation BID Osvaldo Shipper, MD   0.5 mg at 07/20/22 0838   ceFEPIme  (MAXIPIME) 2 g in sodium chloride 0.9 % 100 mL IVPB  2 g Intravenous Q12H Stevphen Rochester, RPH 200 mL/hr at 07/20/22 0958 2 g at 07/20/22 0958   dorzolamide-timolol (COSOPT) 2-0.5 % ophthalmic solution 1 drop  1 drop Right Eye BID Regalado, Belkys A, MD   1 drop at 07/20/22 1311   furosemide (LASIX) injection 40 mg  40 mg Intravenous BID Nahser, Deloris Ping, MD   40 mg at 07/20/22 1501   guaiFENesin (MUCINEX) 12 hr tablet 1,200 mg  1,200 mg Oral BID Regalado, Belkys A, MD   1,200 mg at 07/20/22 1304   insulin aspart (novoLOG) injection 0-15 Units  0-15 Units Subcutaneous TID WC Osvaldo Shipper, MD   3 Units at 07/20/22 0952   ipratropium-albuterol (DUONEB) 0.5-2.5 (3) MG/3ML nebulizer solution 3 mL  3 mL Inhalation TID Osvaldo Shipper, MD   3 mL at 07/20/22 1531   levothyroxine (SYNTHROID) tablet 125 mcg  125 mcg Oral Q0600 Osvaldo Shipper, MD   125 mcg at 07/20/22 0547   methylPREDNISolone sodium succinate (SOLU-MEDROL) 40 mg/mL injection 40 mg  40 mg Intravenous BID Osvaldo Shipper, MD   40 mg at 07/20/22 0952   ondansetron (ZOFRAN) tablet 4 mg  4 mg Oral Q6H PRN Osvaldo Shipper, MD       Or   ondansetron [facility]) injection 4 mg  4 mg Intravenous Q6H PRN Osvaldo Shipper, MD       primidone (MYSOLINE) tablet 125 mg  125 mg Oral q1800 Osvaldo Shipper, MD       primidone (MYSOLINE) tablet 250 mg  250 mg Oral q morning Osvaldo Shipper, MD   250 mg at 07/20/22 1305   propranolol (INDERAL) tablet 20 mg  20 mg Oral BID Regalado, Belkys A, MD   20 mg at 07/20/22 1305   vancomycin (VANCOREADY) IVPB 750 mg/150 mL  750 mg Intravenous Q24H Stevphen Rochester, RPH 150 mL/hr at 07/20/22 0202 750 mg at 07/20/22 0202     Discharge Medications: Please see discharge summary for a list of discharge medications.  Relevant Imaging Results:  Relevant Lab Results:   Additional Information SSN [id number]  Lorri Frederick, LCSW

## 2022-07-20 NOTE — Progress Notes (Signed)
PROGRESS NOTE    Billy Smith  [id number] DOB: September 13, 1932 DOA: 07/19/2022 PCP: Nicoletta Dress, MD   Brief Narrative: 88 year old with past medical history significant for chronic diastolic heart failure, history of COPD not on oxygen at home, CAD status post CABG, A-flutter on Eliquis prior hospitalization September for acute respiratory failure with hypercapnia in the setting of aspiration pneumonia.  Prior to that hospitalization he was hospitalized at [facility] for pneumonia as well.  He presented with worsening cough over the last 2 days, worsening hypoxemia.  Evaluation in the ED chest x-ray raise concern for pneumonia.  Patient admitted for pneumonia.   Assessment & Plan:   Principal Problem:   HCAP (healthcare-associated pneumonia) Active Problems:   Acute metabolic encephalopathy   Atrial flutter (HCC)   COPD (chronic obstructive pulmonary disease) (HCC)   Coronary artery disease   Acute respiratory failure with hypoxia (HCC)   Chronic diastolic CHF (congestive heart failure) (HCC)   1-Acute  Hypoxic Respiratory Failure associated with pneumonia and acute COPD exacerbation: -Concern for aspiration pneumonia, a speech has been consulted. -MRSA PCR psotive.  -Continue with Cefepime and vancomycin, added Azithro.  -Continue with Solumedrol IV Continue with Nebulizer treatment.    Acute metabolic encephalopathy: -in setting of infection, acute illness.  -support care.  -ABG PH compensated.  Taper down oxygen, Oxygen sat goal 90--% He subsequently wake up more.   AKI: Creatinine 1.0 Presented  with a creatinine 1.5 Continue to monitor.   History of a flutter/A-fib: -Continue with Eliquis  CAD status post CABG: -Continue with Lipitor.   Chronic diastolic heart failure: He recently had increase torsemide dose to 60 mg daily Cardiology have been consulted.   Goals of Care:  DNR/DNI appropriately.  Palliative care consulted.  Daughter wants to try to  get him to his prior level of health.   Essential tremor; continue with Propanolol and and Primidone.  Hypothyroidism; continue with Synthroid.   Sleep Apnea; CPAP ordered.      Estimated body mass index is 37.79 kg/m as calculated from the following:   Height as of this encounter: 5\' 5"  (1.651 m).   Weight as of this encounter: 103 kg.   DVT prophylaxis: Eliquis Code Status: DNR Family Communication: care discussed with Daughter  Disposition Plan:  Status is: Inpatient Remains inpatient appropriate because: Management resp failure    Consultants:  Cardiology   Procedures:    Antimicrobials:    Subjective: He was lethargic this am. Apnea episode while sleeping. He has history of sleep apnea. He subsequently wake up.   Objective: Vitals:   07/20/22 0045 07/20/22 0046 07/20/22 0313 07/20/22 0720  BP:   134/74 110/62  Pulse: (!) 105  92 75  Resp: 19  17   Temp:  (!) 100.5 F (38.1 C) 98.5 F (36.9 C) 98.1 F (36.7 C)  TempSrc:  Rectal Oral Oral  SpO2: 99%  99% 100%  Weight:      Height:        Intake/Output Summary (Last 24 hours) at 07/20/2022 0749 Last data filed at 07/20/2022 0400 Gross per 24 hour  Intake 647.54 ml  Output --  Net 647.54 ml   Filed Weights   07/19/22 2144  Weight: 103 kg    Examination:  General exam: Appears calm and comfortable  Respiratory system: BL wheezing.  Cardiovascular system: S1 & S2 heard, RRR.  Gastrointestinal system: Abdomen is nondistended, soft and nontender. No organomegaly or masses felt. Normal bowel sounds heard. Central nervous system:  Alert and oriented. No focal neurological deficits. Extremities: Trace edema   Data Reviewed: I have personally reviewed following labs and imaging studies  CBC: Recent Labs  Lab 07/19/22 2129 07/19/22 2327  WBC 9.7  --   NEUTROABS 7.0  --   HGB 13.8 12.6*  HCT 40.5 37.0*  MCV 105.7*  --   PLT 191  --    Basic Metabolic Panel: Recent Labs  Lab  07/19/22 2129 07/19/22 2327  NA 138 135  K 3.9 4.0  CL 99  --   CO2 25  --   GLUCOSE 124*  --   BUN 35*  --   CREATININE 1.52*  --   CALCIUM 8.8*  --    GFR: Estimated Creatinine Clearance: 37.8 mL/min (A) (by C-G formula based on SCr of 1.52 mg/dL (H)). Liver Function Tests: Recent Labs  Lab 07/19/22 2129  AST 32  ALT 23  ALKPHOS 79  BILITOT 0.6  PROT 7.2  ALBUMIN 3.5   No results for input(s): "LIPASE", "AMYLASE" in the last 168 hours. No results for input(s): "AMMONIA" in the last 168 hours. Coagulation Profile: No results for input(s): "INR", "PROTIME" in the last 168 hours. Cardiac Enzymes: No results for input(s): "CKTOTAL", "CKMB", "CKMBINDEX", "TROPONINI" in the last 168 hours. BNP (last 3 results) No results for input(s): "PROBNP" in the last 8760 hours. HbA1C: No results for input(s): "HGBA1C" in the last 72 hours. CBG: No results for input(s): "GLUCAP" in the last 168 hours. Lipid Profile: No results for input(s): "CHOL", "HDL", "LDLCALC", "TRIG", "CHOLHDL", "LDLDIRECT" in the last 72 hours. Thyroid Function Tests: No results for input(s): "TSH", "T4TOTAL", "FREET4", "T3FREE", "THYROIDAB" in the last 72 hours. Anemia Panel: No results for input(s): "VITAMINB12", "FOLATE", "FERRITIN", "TIBC", "IRON", "RETICCTPCT" in the last 72 hours. Sepsis Labs: Recent Labs  Lab 07/20/22 0046 07/20/22 0214  PROCALCITON  --  <0.10  LATICACIDVEN 1.3  --     Recent Results (from the past 240 hour(s))  Resp Panel by RT-PCR (Flu A&B, Covid) Anterior Nasal Swab     Status: None   Collection Time: 07/19/22  9:29 PM   Specimen: Anterior Nasal Swab  Result Value Ref Range Status   SARS Coronavirus 2 by RT PCR NEGATIVE NEGATIVE Final    Comment: (NOTE) SARS-CoV-2 target nucleic acids are NOT DETECTED.  The SARS-CoV-2 RNA is generally detectable in upper respiratory specimens during the acute phase of infection. The lowest concentration of SARS-CoV-2 viral copies this  assay can detect is 138 copies/mL. A negative result does not preclude SARS-Cov-2 infection and should not be used as the sole basis for treatment or other patient management decisions. A negative result may occur with  improper specimen collection/handling, submission of specimen other than nasopharyngeal swab, presence of viral mutation(s) within the areas targeted by this assay, and inadequate number of viral copies(<138 copies/mL). A negative result must be combined with clinical observations, patient history, and epidemiological information. The expected result is Negative.  Fact Sheet for Patients:  BloggerCourse.com  Fact Sheet for Healthcare Providers:  SeriousBroker.it  This test is no t yet approved or cleared by the [place] FDA and  has been authorized for detection and/or diagnosis of SARS-CoV-2 by FDA under an Emergency Use Authorization (EUA). This EUA will remain  in effect (meaning this test can be used) for the duration of the COVID-19 declaration under Section 564(b)(1) of the Act, 21 U.S.C.section 360bbb-3(b)(1), unless the authorization is terminated  or revoked sooner.  Influenza A by PCR NEGATIVE NEGATIVE Final   Influenza B by PCR NEGATIVE NEGATIVE Final    Comment: (NOTE) The Xpert Xpress SARS-CoV-2/FLU/RSV plus assay is intended as an aid in the diagnosis of influenza from Nasopharyngeal swab specimens and should not be used as a sole basis for treatment. Nasal washings and aspirates are unacceptable for Xpert Xpress SARS-CoV-2/FLU/RSV testing.  Fact Sheet for Patients: EntrepreneurPulse.com.au  Fact Sheet for Healthcare Providers: IncredibleEmployment.be  This test is not yet approved or cleared by the [place] FDA and has been authorized for detection and/or diagnosis of SARS-CoV-2 by FDA under an Emergency Use Authorization (EUA). This EUA will  remain in effect (meaning this test can be used) for the duration of the COVID-19 declaration under Section 564(b)(1) of the Act, 21 U.S.C. section 360bbb-3(b)(1), unless the authorization is terminated or revoked.  Performed at [facility], [place] [address] [place], [place] [address]          Radiology Studies: DG Chest Portable 1 View  Result Date: 07/19/2022 CLINICAL DATA:  sob EXAM: PORTABLE CHEST 1 VIEW COMPARISON:  Chest x-ray 05/03/2022. CT chest 05/01/2022 FINDINGS: The heart and mediastinal contours are unchanged. Aortic calcification. Persistent retrocardiac airspace opacity with associated elevated left hemidiaphragm. No pulmonary edema. Likely trace left pleural effusion. No right pleural effusion. No pneumothorax. No acute osseous abnormality.  Intact sternotomy wires IMPRESSION: 1. Persistent retrocardiac airspace opacity with associated elevated left hemidiaphragm. 2. Likely trace left pleural effusion. 3.  Aortic Atherosclerosis (ICD10-I70.0). Electronically Signed   By: Iven Finn M.D.   On: 07/19/2022 22:05        Scheduled Meds:  apixaban  5 mg Oral BID   atorvastatin  40 mg Oral QHS   budesonide  2 mL Inhalation BID   insulin aspart  0-15 Units Subcutaneous TID WC   ipratropium-albuterol  3 mL Inhalation TID   levothyroxine  125 mcg Oral Q0600   methylPREDNISolone (SOLU-MEDROL) injection  40 mg Intravenous BID   primidone  125 mg Oral q1800   primidone  250 mg Oral q morning   propranolol  20 mg Oral BID   Continuous Infusions:  sodium chloride 75 mL/hr at 07/20/22 0159   ceFEPime (MAXIPIME) IV 2 g (07/20/22 0207)   vancomycin 750 mg (07/20/22 0202)     LOS: 0 days    Time spent: 35 minutes    Doyel Mulkern A Nadia Torr, MD [facility]   If 7PM-7AM, please contact night-coverage [email or web address]  07/20/2022, 7:49 AM

## 2022-07-20 NOTE — Progress Notes (Signed)
SLP Cancellation Note  Patient Details Name: Billy Smith MRN: [id number] DOB: 1932/11/08   Cancelled treatment:       Reason Eval/Treat Not Completed: Fatigue/lethargy limiting ability to participate.  Pt is asleep with CPAP in place.  RN stated that pt is not appropriate at this time.  SLP will f/u for bedside swallow evaluation as appropriate and as schedule allows.    Shanon Rosser Manveer Gomes 07/20/2022, 9:49 AM

## 2022-07-20 NOTE — Progress Notes (Signed)
OT Cancellation Note  Patient Details Name: CASHIUS GRANDSTAFF MRN: [id number] DOB: 12/16/1932   Cancelled Treatment:    Reason Eval/Treat Not Completed: Fatigue/lethargy limiting ability to participate (Spoke with nursing and requesting to hold at this time.) Alphia Moh OTR/L  Acute Rehab Services  [phone] office number [phone] pager number   Alphia Moh 07/20/2022, 12:16 PM

## 2022-07-20 NOTE — Progress Notes (Signed)
Pt oriented x1.  CSW confirmed with [facility] that pt is LTC at [facility].  Can return for rehab.  CSW LM with pt daughter [name] to discuss DC plan. Daleen Squibb, MSW, LCSW 11/30/20234:07 PM

## 2022-07-20 NOTE — ED Notes (Signed)
Per main lab, never received Lactic that was sent at  2130. This RN will recollect.

## 2022-07-20 NOTE — Consult Note (Addendum)
Cardiology Consultation   Patient ID: Billy Smith MRN: [id number]; DOB: 06/02/1933  Admit date: 07/19/2022 Date of Consult: 07/20/2022  PCP:  Nicoletta Dress, MD   [facility] Cardiologist:  Lauree Chandler, MD        Patient Profile:   Billy Smith is a 88 y.o. male with a hx of chronic diastolic CHF, COPD (not on home O2), CAD s/p CABG, atrial flutter (on Eliquis) who is being seen 07/20/2022 for the evaluation of CHF at the request of Dr. Tyrell Antonio.  History of Present Illness:   Mr. Billy Smith is an 88 year old male with above noted medical history. Patient was brought to the ED by EMS after developing a cough with worsening shortness of breath and low O2 over the previous 2 days. In the ED, patient noted to be lethargic and not responsive to questions. At baseline he can answer questions and ambulate with a walker. Patient with a recent hospitalization this past September for acute respiratory failure with hypercapnia, found with aspiration pneumonia. Patient was treated with meropenem and ceftriaxone. Upon returning to SNF, he received Augmentin and then Levofloxacin.   In the ED, patient found with temp of 100.5. CXR concerning for left lung pneumonia. WBC 8.7. Patient admitted to general medicine service and has been placed on cefepime/vancomycin.   On my exam, patient is sitting up in bedside chair, on nasal cannula. He is able to talk with me but is clearly confused and not able to provide a reliable history or ROS. He repeatedly tells me that he misses his wife and wants to "join her in heaven."  Cardiac history:  Patient is followed by Dr. Angelena Form, has not been seen in clinic since 2021. Cardiology was involved in patient's hospitalization this September and patient was discharged on Torsemide, Propranolol, Eliquis. He has history of 4 vessel CABG (LIMA to LAD, SVG to intermediate, SVG to circumflex marginal, SVG to PDA). Stress myoview in 2015 was  without ischemia.    Past Medical History:  Diagnosis Date   Abnormal involuntary movements(781.0)    CAD (coronary artery disease)    with 4V CABG 2004   Chronic airway obstruction, not elsewhere classified    Nephrolithiasis    Obstructive sleep apnea (adult) (pediatric)    Venous insufficiency     Past Surgical History:  Procedure Laterality Date   CORONARY ARTERY BYPASS GRAFT  2004     Home Medications:  Prior to Admission medications   Medication Sig Start Date End Date Taking? Authorizing Provider  albuterol (VENTOLIN HFA) 108 (90 Base) MCG/ACT inhaler Inhale 2 puffs into the lungs every 6 (six) hours as needed for wheezing or shortness of breath.    [provider]  apixaban (ELIQUIS) 5 MG TABS tablet Take 5 mg by mouth 2 (two) times daily. 04/17/22   [provider]  atorvastatin (LIPITOR) 40 MG tablet Take 40 mg by mouth at bedtime. 02/03/17   [provider]  bimatoprost (LUMIGAN) 0.01 % SOLN Place 1 drop into both eyes at bedtime. 01/27/22   [provider]  brimonidine (ALPHAGAN) 0.15 % ophthalmic solution Place 1 drop into both eyes 2 (two) times daily. 02/19/20   [provider]  budesonide (PULMICORT) 0.5 MG/2ML nebulizer solution Inhale 2 mLs into the lungs 2 (two) times daily. 04/17/22   [provider]  calcium-vitamin D (OSCAL WITH D) 500-5 MG-MCG tablet Take 1 tablet by mouth 2 (two) times daily.    [provider]  Cholecalciferol 125 MCG (5000 UT) TABS Take 5,000 Units by mouth daily. 04/18/22   [provider]  dorzolamide-timolol (COSOPT) 22.3-6.8 MG/ML ophthalmic solution Place 1 drop into the right eye 2 (two) times daily. 02/16/20   [provider]  empagliflozin (JARDIANCE) 10 MG TABS tablet Take 1 tablet (10 mg total) by mouth daily. 05/10/22   Arrien, Jimmy Picket, MD  FOLTABS 800 800-10-115 MCG-MG-MCG TABS Take 1 tablet by mouth daily. 11/27/16   [provider]   guaiFENesin (ROBITUSSIN) 100 MG/5ML liquid Take 10 mLs by mouth every 6 (six) hours as needed for cough.    [provider]  ipratropium-albuterol (DUONEB) 0.5-2.5 (3) MG/3ML SOLN Inhale 3 mLs into the lungs 3 (three) times daily. 04/17/22   [provider]  levothyroxine (SYNTHROID, LEVOTHROID) 125 MCG tablet Take 125 mcg by mouth daily.    [provider]  primidone (MYSOLINE) 250 MG tablet Take 1 tablet (250 mg total) by mouth every morning. 05/10/22   Arrien, Jimmy Picket, MD  primidone 125 MG TABS Take 125 mg by mouth daily at 6 PM. 05/09/22   Arrien, Jimmy Picket, MD  propranolol (INDERAL) 20 MG tablet Take 1 tablet (20 mg total) by mouth 2 (two) times daily. 05/09/22 06/08/22  Arrien, Jimmy Picket, MD  senna (SENOKOT) 8.6 MG tablet Take 1 tablet by mouth every 12 (twelve) hours as needed for constipation.    [provider]  spironolactone (ALDACTONE) 25 MG tablet Take 0.5 tablets (12.5 mg total) by mouth daily. 05/10/22 06/09/22  Arrien, Jimmy Picket, MD  torsemide 40 MG TABS Take 40 mg by mouth 2 (two) times daily. 05/09/22 06/08/22  Arrien, Jimmy Picket, MD    Inpatient Medications: Scheduled Meds:  apixaban  5 mg Oral BID   atorvastatin  40 mg Oral QHS   bimatoprost  1 drop Both Eyes QHS   brimonidine  1 drop Both Eyes BID   budesonide  2 mL Inhalation BID   dorzolamide-timolol  1 drop Right Eye BID   guaiFENesin  1,200 mg Oral BID   insulin aspart  0-15 Units Subcutaneous TID WC   ipratropium-albuterol  3 mL Inhalation TID   levothyroxine  125 mcg Oral Q0600   methylPREDNISolone (SOLU-MEDROL) injection  40 mg Intravenous BID   primidone  125 mg Oral q1800   primidone  250 mg Oral q morning   propranolol  20 mg Oral BID   Continuous Infusions:  azithromycin (ZITHROMAX) 500 mg in sodium chloride 0.9 % 250 mL IVPB     ceFEPime (MAXIPIME) IV 2 g (07/20/22 0958)   vancomycin 750 mg (07/20/22 0202)   PRN Meds: acetaminophen **OR**  acetaminophen, albuterol, ondansetron **OR** ondansetron (ZOFRAN) IV  Allergies:    Allergies  Allergen Reactions   Xalatan [Latanoprost]     Xalatan (latanoprost) does not control ocular pressure.  Must use Lumigan (bimatoprost).    Social History:   Social History   Socioeconomic History   Marital status: Married    Spouse name: Not on file   Number of children: 1   Years of education: Not on file   Highest education level: Not on file  Occupational History   Occupation: Retired-welder  Tobacco Use   Smoking status: Former    Packs/day: 0.50    Years: 20.00    Total pack years: 10.00    Types: Cigarettes    Quit date: 08/21/2002    Years since quitting: 19.9   Smokeless tobacco: Never   Tobacco  comments:    pt stopped smoking after his bypass  Vaping Use   Vaping Use: Never used  Substance and Sexual Activity   Alcohol use: No   Drug use: No   Sexual activity: Not on file  Other Topics Concern   Not on file  Social History Narrative   Not on file   Social Determinants of Health   Financial Resource Strain: Not on file  Food Insecurity: No Food Insecurity (07/20/2022)   Hunger Vital Sign    Worried About Running Out of Food in the Last Year: Never true    Ran Out of Food in the Last Year: Never true  Transportation Needs: No Transportation Needs (07/20/2022)   PRAPARE - Administrator, Civil Service (Medical): No    Lack of Transportation (Non-Medical): No  Physical Activity: Not on file  Stress: Not on file  Social Connections: Not on file  Intimate Partner Violence: Not At Risk (07/20/2022)   Humiliation, Afraid, Rape, and Kick questionnaire    Fear of Current or Ex-Partner: No    Emotionally Abused: No    Physically Abused: No    Sexually Abused: No    Family History:    Family History  Problem Relation Age of Onset   Hypertension Mother    CAD Brother    Valvular heart disease Brother    Hypertension Brother    Heart attack Neg Hx     Stroke Neg Hx      ROS:  Please see the history of present illness.   All other ROS reviewed and negative.     Physical Exam/Data:   Vitals:   07/20/22 0046 07/20/22 0313 07/20/22 0720 07/20/22 0807  BP:  134/74 110/62 105/65  Pulse:  92 75 71  Resp:  17  14  Temp: (!) 100.5 F (38.1 C) 98.5 F (36.9 C) 98.1 F (36.7 C) (!) 97.5 F (36.4 C)  TempSrc: Rectal Oral Oral Oral  SpO2:  99% 100% 100%  Weight:      Height:        Intake/Output Summary (Last 24 hours) at 07/20/2022 1224 Last data filed at 07/20/2022 0400 Gross per 24 hour  Intake 647.54 ml  Output --  Net 647.54 ml      07/19/2022    9:44 PM 05/09/2022    4:00 AM 05/08/2022    3:25 AM  Last 3 Weights  Weight (lbs) 227 lb 1.2 oz 225 lb 5 oz 222 lb 10.6 oz  Weight (kg) 103 kg 102.2 kg 101 kg     Body mass index is 37.79 kg/m.  General:  Patient is very ill and frail appearing. HEENT: gurgling secretions heard over throat. Neck: no JVD Vascular: No carotid bruits; Distal pulses 2+ bilaterally Cardiac:  normal S1, S2; RRR; no murmur. Heart sounds very difficult to hear in detail due to loud upper airway/upper lobe breath sounds. Lungs:  diffuse expiratory wheezing with rhonchorous sounds over the throat. Abd: soft, nontender, no hepatomegaly  Ext: hands and legs have diffuse 1+ edema  Musculoskeletal:  No deformities, BUE and BLE strength normal and equal Skin: warm and dry  Neuro:  CNs 2-12 intact, no focal abnormalities noted Psych:  Normal affect   EKG:  The EKG was personally reviewed and demonstrates:  atrial fibrillation with RVR, ventricular rates 100-120. Telemetry:  Telemetry was personally reviewed and demonstrates:  sinus rhythm, rates 70s-80s this morning.   Relevant CV Studies:  05/02/2022 TTE  IMPRESSIONS  1. Left ventricular ejection fraction, by estimation, is 60 to 65%. The  left ventricle has normal function. Left ventricular endocardial border  not optimally defined to  evaluate regional wall motion. There is mild  concentric left ventricular  hypertrophy. Left ventricular diastolic parameters are consistent with  Grade II diastolic dysfunction (pseudonormalization).   2. Right ventricular systolic function was not well visualized. The right  ventricular size is not well visualized. There is mildly elevated  pulmonary artery systolic pressure. The estimated right ventricular  systolic pressure is 123XX123 mmHg.   3. The mitral valve is grossly normal. No evidence of mitral valve  regurgitation. No evidence of mitral stenosis.   4. The aortic valve is tricuspid. Aortic valve regurgitation is not  visualized. Aortic valve sclerosis is present, with no evidence of aortic  valve stenosis.   5. The inferior vena cava is dilated in size with <50% respiratory  variability, suggesting right atrial pressure of 15 mmHg.   Comparison(s): No significant change from prior study.   FINDINGS   Left Ventricle: Left ventricular ejection fraction, by estimation, is 60  to 65%. The left ventricle has normal function. Left ventricular  endocardial border not optimally defined to evaluate regional wall motion.  The left ventricular internal cavity  size was normal in size. There is mild concentric left ventricular  hypertrophy. Left ventricular diastolic parameters are consistent with  Grade II diastolic dysfunction (pseudonormalization).   Right Ventricle: The right ventricular size is not well visualized. Right  vetricular wall thickness was not well visualized. Right ventricular  systolic function was not well visualized. There is mildly elevated  pulmonary artery systolic pressure. The  tricuspid regurgitant velocity is 2.46 m/s, and with an assumed right  atrial pressure of 15 mmHg, the estimated right ventricular systolic  pressure is 123XX123 mmHg.   Left Atrium: Left atrial size was normal in size.   Right Atrium: Right atrial size was normal in size.    Pericardium: Trivial pericardial effusion is present. Presence of  epicardial fat layer.   Mitral Valve: The mitral valve is grossly normal. No evidence of mitral  valve regurgitation. No evidence of mitral valve stenosis.   Tricuspid Valve: The tricuspid valve is grossly normal. Tricuspid valve  regurgitation is trivial. No evidence of tricuspid stenosis.   Aortic Valve: The aortic valve is tricuspid. Aortic valve regurgitation is  not visualized. Aortic valve sclerosis is present, with no evidence of  aortic valve stenosis. Aortic valve peak gradient measures 4.0 mmHg.   Pulmonic Valve: The pulmonic valve was grossly normal. Pulmonic valve  regurgitation is not visualized. No evidence of pulmonic stenosis.   Aorta: The aortic root and ascending aorta are structurally normal, with  no evidence of dilitation.   Venous: The inferior vena cava is dilated in size with less than 50%  respiratory variability, suggesting right atrial pressure of 15 mmHg.   IAS/Shunts: The atrial septum is grossly normal.    Laboratory Data:  High Sensitivity Troponin:   Recent Labs  Lab 07/19/22 2129 07/19/22 2317  TROPONINIHS 24* 24*     Chemistry Recent Labs  Lab 07/19/22 2129 07/19/22 2327 07/20/22 0902  NA 138 135 140  K 3.9 4.0 4.1  CL 99  --  97*  CO2 25  --  29  GLUCOSE 124*  --  139*  BUN 35*  --  35*  CREATININE 1.52*  --  1.38*  CALCIUM 8.8*  --  8.6*  GFRNONAA 44*  --  49*  ANIONGAP 14  --  14    Recent Labs  Lab 07/19/22 2129  PROT 7.2  ALBUMIN 3.5  AST 32  ALT 23  ALKPHOS 79  BILITOT 0.6   Lipids No results for input(s): "CHOL", "TRIG", "HDL", "LABVLDL", "LDLCALC", "CHOLHDL" in the last 168 hours.  Hematology Recent Labs  Lab 07/19/22 2129 07/19/22 2327 07/20/22 0902  WBC 9.7  --  8.7  RBC 3.83*  --  3.36*  HGB 13.8 12.6* 12.2*  HCT 40.5 37.0* 35.0*  MCV 105.7*  --  104.2*  MCH 36.0*  --  36.3*  MCHC 34.1  --  34.9  RDW 13.7  --  13.7  PLT 191  --   153   Thyroid No results for input(s): "TSH", "FREET4" in the last 168 hours.  BNP Recent Labs  Lab 07/19/22 2129 07/20/22 0902  BNP 84.6 79.7    DDimer No results for input(s): "DDIMER" in the last 168 hours.   Radiology/Studies:  DG Chest Portable 1 View  Result Date: 07/19/2022 CLINICAL DATA:  sob EXAM: PORTABLE CHEST 1 VIEW COMPARISON:  Chest x-ray 05/03/2022. CT chest 05/01/2022 FINDINGS: The heart and mediastinal contours are unchanged. Aortic calcification. Persistent retrocardiac airspace opacity with associated elevated left hemidiaphragm. No pulmonary edema. Likely trace left pleural effusion. No right pleural effusion. No pneumothorax. No acute osseous abnormality.  Intact sternotomy wires IMPRESSION: 1. Persistent retrocardiac airspace opacity with associated elevated left hemidiaphragm. 2. Likely trace left pleural effusion. 3.  Aortic Atherosclerosis (ICD10-I70.0). Electronically Signed   By: Tish Frederickson M.D.   On: 07/19/2022 22:05     Assessment and Plan:   Chronic diastolic CHF Acute hypoxic respiratory failure  Patient with LVEF 60-65%, grade II diastolic dysfunction on 05/02/2022 TTE. BNP this admission 84.6, 79.7. No evidence of acute volume overload on exam. Suspect that patient's dyspnea is driven by pulmonary process, high concern for aspiration leading to recurrent pneumonia as patient has upper airway rhonchi on exam.  . Continue ABX per primary team Strongly support palliative discussions. Though patient is confused, he seems to be clear about not wanting heroic measures.   Hx atrial fibrillation/atrial flutter  Patient with history of fib/flutter was initially noted to be in afib with RVR. Is now rate controlled, in normal sinus rhythm. Continue rate control with Propranolol, anticoagulation with Eliquis.  Dysphagia  Patient appears to have significant swallowing challenges and I suspect that this contributes to pneumonitis. Needs swallow  study.   Risk Assessment/Risk Scores:        New York Heart Association (NYHA) Functional Class Unable to assess with confused patient and no family at bedside.   CHA2DS2-VASc Score = 5   This indicates a 7.2% annual risk of stroke. The patient's score is based upon: CHF History: 1 HTN History: 1 Diabetes History: 0 Stroke History: 0 Vascular Disease History: 1 Age Score: 2 Gender Score: 0         For questions or updates, please contact [facility] Please consult [email or web address] for contact info under    Signed, Perlie Gold, PA-C  07/20/2022 12:24 PM   Attending Note:   The patient was seen and examined.  Agree with assessment and plan as noted above.  Changes made to the above note as needed.  Patient seen and independently examined with Perlie Gold , PA .   We discussed all aspects of the encounter. I agree with the assessment and plan as stated above.    Acute on chronic hypoxic  respiratory failure : Pt acute on chronic hypoxic respiratory failure.  He has diffuse edema in his hands and feet.   He is B natruretic peptide is normal on 2 different blood draws.   I'm not clear if he actually has heart failure but I would like to give him several doses of lasix to see if it helps     2.  Pneumonia :   possible pneumonia .  Plans per primary care    3.  Confusion     I have spent a total of 40 minutes with patient reviewing hospital  notes , telemetry, EKGs, labs and examining patient as well as establishing an assessment and plan that was discussed with the patient.  > 50% of time was spent in direct patient care.    Thayer Headings, Brooke Bonito., MD, [facility] 07/20/2022, 1:43 PM [address]  [place] Pager [phone]

## 2022-07-21 DIAGNOSIS — I5033 Acute on chronic diastolic (congestive) heart failure: Secondary | ICD-10-CM

## 2022-07-21 DIAGNOSIS — J189 Pneumonia, unspecified organism: Secondary | ICD-10-CM | POA: Diagnosis not present

## 2022-07-21 LAB — CBC
HCT: 34.6 % — ABNORMAL LOW (ref 39.0–52.0)
Hemoglobin: 11.7 g/dL — ABNORMAL LOW (ref 13.0–17.0)
MCH: 36.2 pg — ABNORMAL HIGH (ref 26.0–34.0)
MCHC: 33.8 g/dL (ref 30.0–36.0)
MCV: 107.1 fL — ABNORMAL HIGH (ref 80.0–100.0)
Platelets: 143 10*3/uL — ABNORMAL LOW (ref 150–400)
RBC: 3.23 MIL/uL — ABNORMAL LOW (ref 4.22–5.81)
RDW: 13.9 % (ref 11.5–15.5)
WBC: 7.9 10*3/uL (ref 4.0–10.5)
nRBC: 0 % (ref 0.0–0.2)

## 2022-07-21 LAB — URINE CULTURE: Culture: NO GROWTH

## 2022-07-21 LAB — PROCALCITONIN: Procalcitonin: <0.1 ng/mL

## 2022-07-21 LAB — BASIC METABOLIC PANEL
Anion gap: 14 (ref 5–15)
BUN: 38 mg/dL — ABNORMAL HIGH (ref 8–23)
CO2: 31 mmol/L (ref 22–32)
Calcium: 9.1 mg/dL (ref 8.9–10.3)
Chloride: 98 mmol/L (ref 98–111)
Creatinine, Ser: 1.28 mg/dL — ABNORMAL HIGH (ref 0.61–1.24)
GFR, Estimated: 54 mL/min — ABNORMAL LOW (ref >60)
Glucose, Bld: 133 mg/dL — ABNORMAL HIGH (ref 70–99)
Potassium: 4.5 mmol/L (ref 3.5–5.1)
Sodium: 143 mmol/L (ref 135–145)

## 2022-07-21 LAB — GLUCOSE, CAPILLARY
Glucose-Capillary: 115 mg/dL — ABNORMAL HIGH (ref 70–99)
Glucose-Capillary: 122 mg/dL — ABNORMAL HIGH (ref 70–99)
Glucose-Capillary: 116 mg/dL — ABNORMAL HIGH (ref 70–99)
Glucose-Capillary: 97 mg/dL (ref 70–99)

## 2022-07-21 LAB — HIV ANTIBODY (ROUTINE TESTING W REFLEX): HIV Screen 4th Generation wRfx: NONREACTIVE

## 2022-07-21 MED ORDER — SODIUM CHLORIDE 0.9 % IV SOLN
2.0000 g | INTRAVENOUS | Status: AC
  Administered 2022-07-21 – 2022-07-25 (×5): 2 g via INTRAVENOUS
  Filled 2022-07-21 (×5): qty 20

## 2022-07-21 MED ORDER — MUPIROCIN 2 % EX OINT
1.0000 | TOPICAL_OINTMENT | Freq: Two times a day (BID) | CUTANEOUS | Status: AC
  Administered 2022-07-21 – 2022-07-26 (×9): 1 via NASAL
  Filled 2022-07-21 (×2): qty 22

## 2022-07-21 MED ORDER — CHLORHEXIDINE GLUCONATE CLOTH 2 % EX PADS
6.0000 | MEDICATED_PAD | Freq: Every day | CUTANEOUS | Status: AC
  Administered 2022-07-22 – 2022-07-26 (×4): 6 via TOPICAL

## 2022-07-21 MED ORDER — ARFORMOTEROL TARTRATE 15 MCG/2ML IN NEBU
15.0000 ug | INHALATION_SOLUTION | Freq: Two times a day (BID) | RESPIRATORY_TRACT | Status: DC
  Administered 2022-07-21 – 2022-07-28 (×14): 15 ug via RESPIRATORY_TRACT
  Filled 2022-07-21 (×14): qty 2

## 2022-07-21 NOTE — Progress Notes (Signed)
PROGRESS NOTE    Billy Smith  [id number] DOB: Jan 05, 1933 DOA: 07/19/2022 PCP: Nicoletta Dress, MD   Brief Narrative: 88 year old with past medical history significant for chronic diastolic heart failure, history of COPD not on oxygen at home, CAD status post CABG, A-flutter on Eliquis prior hospitalization September for acute respiratory failure with hypercapnia in the setting of aspiration pneumonia.  Prior to that hospitalization he was hospitalized at [facility] for pneumonia as well.  He presented with worsening cough over the last 2 days, worsening hypoxemia.  Evaluation in the ED chest x-ray raise concern for pneumonia.  Patient admitted for pneumonia.   Assessment & Plan:   Principal Problem:   HCAP (healthcare-associated pneumonia) Active Problems:   Acute metabolic encephalopathy   Atrial flutter (HCC)   COPD (chronic obstructive pulmonary disease) (HCC)   Coronary artery disease   Acute respiratory failure with hypoxia (HCC)   Chronic diastolic CHF (congestive heart failure) (HCC)   1-Acute  Hypoxic Respiratory Failure associated with pneumonia and acute COPD exacerbation: -Concern for aspiration pneumonia, a speech has been consulted. -MRSA PCR psotive.  -Continue with  vancomycin,  Azithro. Change cefepime to ceftriaxone to prevent encephalopathy/  -Continue with Solumedrol IV -Continue with Nebulizer treatment.  -Still with significant wheezing. Continue with guaifenesin, will add brovana.  -Evaluated by speech, concern for aspiration. Keep NPO for now.   Acute metabolic encephalopathy: -in setting of infection, acute illness.  -Support care.  -ABG PH compensated.  Taper down oxygen, Oxygen sat goal 90--% He is more alert today, answer question.  Change cefepime to ceftriaxone to avoid encephalopathy.   AKI: Creatinine 1.0 Presented  with a creatinine 1.5 Continue to monitor on lasix.   History of a flutter/A-fib: -Continue with Eliquis  CAD status  post CABG: -Continue with Lipitor.   Acute on Chronic diastolic heart failure: He recently had increase torsemide dose to 60 mg daily Cardiology have been consulted.  Plan to continue with IV lasix 40 mg IV BID.  Strict I and o Daily weight.   Goals of Care:  DNR/DNI appropriately.  Palliative care consulted.  Daughter wants to try to get him to his prior level of health.   Essential tremor; Continue with Propanolol and and Primidone.  Hypothyroidism; Continue with Synthroid.   Sleep Apnea; CPAP ordered.      Estimated body mass index is 37.79 kg/m as calculated from the following:   Height as of this encounter: 5\' 5"  (1.651 m).   Weight as of this encounter: 103 kg.   DVT prophylaxis: Eliquis Code Status: DNR Family Communication: Care discussed with Daughter  Disposition Plan:  Status is: Inpatient Remains inpatient appropriate because: Management resp failure    Consultants:  Cardiology   Procedures:    Antimicrobials:    Subjective: He is alert , answer question. Report pain in his arm. He is wheezing.   Objective: Vitals:   07/20/22 1716 07/20/22 2050 07/21/22 0115 07/21/22 0423  BP:      Pulse:   76 87  Resp:   15 18  Temp:      TempSrc:    Oral  SpO2: 90% 100% 100% 100%  Weight:      Height:        Intake/Output Summary (Last 24 hours) at 07/21/2022 1301 Last data filed at 07/21/2022 1300 Gross per 24 hour  Intake 630.36 ml  Output --  Net 630.36 ml    Filed Weights   07/19/22 2144  Weight: 103 kg  Examination:  General exam: NAD Respiratory system: BL Wheezing.   Cardiovascular system: S 1, S 2 RRR Gastrointestinal system: BS present, soft, nt Central nervous system:Alert, follows command Extremities: trace edema   Data Reviewed: I have personally reviewed following labs and imaging studies  CBC: Recent Labs  Lab 07/19/22 2129 07/19/22 2327 07/20/22 0902 07/21/22 0350  WBC 9.7  --  8.7 7.9  NEUTROABS 7.0  --   --    --   HGB 13.8 12.6* 12.2* 11.7*  HCT 40.5 37.0* 35.0* 34.6*  MCV 105.7*  --  104.2* 107.1*  PLT 191  --  153 143*    Basic Metabolic Panel: Recent Labs  Lab 07/19/22 2129 07/19/22 2327 07/20/22 0902 07/21/22 0350  NA 138 135 140 143  K 3.9 4.0 4.1 4.5  CL 99  --  97* 98  CO2 25  --  29 31  GLUCOSE 124*  --  139* 133*  BUN 35*  --  35* 38*  CREATININE 1.52*  --  1.38* 1.28*  CALCIUM 8.8*  --  8.6* 9.1    GFR: Estimated Creatinine Clearance: 44.9 mL/min (A) (by C-G formula based on SCr of 1.28 mg/dL (H)). Liver Function Tests: Recent Labs  Lab 07/19/22 2129  AST 32  ALT 23  ALKPHOS 79  BILITOT 0.6  PROT 7.2  ALBUMIN 3.5    No results for input(s): "LIPASE", "AMYLASE" in the last 168 hours. No results for input(s): "AMMONIA" in the last 168 hours. Coagulation Profile: No results for input(s): "INR", "PROTIME" in the last 168 hours. Cardiac Enzymes: No results for input(s): "CKTOTAL", "CKMB", "CKMBINDEX", "TROPONINI" in the last 168 hours. BNP (last 3 results) No results for input(s): "PROBNP" in the last 8760 hours. HbA1C: Recent Labs    07/20/22 0214  HGBA1C 5.9*   CBG: Recent Labs  Lab 07/20/22 0806 07/20/22 1321 07/20/22 1734 07/20/22 1954  GLUCAP 153* 120* 115* 106*   Lipid Profile: No results for input(s): "CHOL", "HDL", "LDLCALC", "TRIG", "CHOLHDL", "LDLDIRECT" in the last 72 hours. Thyroid Function Tests: No results for input(s): "TSH", "T4TOTAL", "FREET4", "T3FREE", "THYROIDAB" in the last 72 hours. Anemia Panel: No results for input(s): "VITAMINB12", "FOLATE", "FERRITIN", "TIBC", "IRON", "RETICCTPCT" in the last 72 hours. Sepsis Labs: Recent Labs  Lab 07/20/22 0046 07/20/22 0214 07/21/22 0350  PROCALCITON  --  <0.10 <0.10  LATICACIDVEN 1.3  --   --      Recent Results (from the past 240 hour(s))  Resp Panel by RT-PCR (Flu A&B, Covid) Anterior Nasal Swab     Status: None   Collection Time: 07/19/22  9:29 PM   Specimen: Anterior  Nasal Swab  Result Value Ref Range Status   SARS Coronavirus 2 by RT PCR NEGATIVE NEGATIVE Final    Comment: (NOTE) SARS-CoV-2 target nucleic acids are NOT DETECTED.  The SARS-CoV-2 RNA is generally detectable in upper respiratory specimens during the acute phase of infection. The lowest concentration of SARS-CoV-2 viral copies this assay can detect is 138 copies/mL. A negative result does not preclude SARS-Cov-2 infection and should not be used as the sole basis for treatment or other patient management decisions. A negative result may occur with  improper specimen collection/handling, submission of specimen other than nasopharyngeal swab, presence of viral mutation(s) within the areas targeted by this assay, and inadequate number of viral copies(<138 copies/mL). A negative result must be combined with clinical observations, patient history, and epidemiological information. The expected result is Negative.  Fact Sheet for Patients:  EntrepreneurPulse.com.au  Fact Sheet for Healthcare Providers:  IncredibleEmployment.be  This test is no t yet approved or cleared by the [place] FDA and  has been authorized for detection and/or diagnosis of SARS-CoV-2 by FDA under an Emergency Use Authorization (EUA). This EUA will remain  in effect (meaning this test can be used) for the duration of the COVID-19 declaration under Section 564(b)(1) of the Act, 21 U.S.C.section 360bbb-3(b)(1), unless the authorization is terminated  or revoked sooner.       Influenza A by PCR NEGATIVE NEGATIVE Final   Influenza B by PCR NEGATIVE NEGATIVE Final    Comment: (NOTE) The Xpert Xpress SARS-CoV-2/FLU/RSV plus assay is intended as an aid in the diagnosis of influenza from Nasopharyngeal swab specimens and should not be used as a sole basis for treatment. Nasal washings and aspirates are unacceptable for Xpert Xpress SARS-CoV-2/FLU/RSV testing.  Fact Sheet for  Patients: EntrepreneurPulse.com.au  Fact Sheet for Healthcare Providers: IncredibleEmployment.be  This test is not yet approved or cleared by the [place] FDA and has been authorized for detection and/or diagnosis of SARS-CoV-2 by FDA under an Emergency Use Authorization (EUA). This EUA will remain in effect (meaning this test can be used) for the duration of the COVID-19 declaration under Section 564(b)(1) of the Act, 21 U.S.C. section 360bbb-3(b)(1), unless the authorization is terminated or revoked.  Performed at [facility], [place] [address], [place], [place] [address]   Urine Culture     Status: None   Collection Time: 07/19/22 10:31 PM   Specimen: Urine, Clean Catch  Result Value Ref Range Status   Specimen Description URINE, CLEAN CATCH  Final   Special Requests NONE  Final   Culture   Final    NO GROWTH Performed at [facility], [place] [address] [place], [place] [address]    Report Status 07/21/2022 FINAL  Final  Blood culture (routine x 2)     Status: None (Preliminary result)   Collection Time: 07/19/22 10:36 PM   Specimen: BLOOD  Result Value Ref Range Status   Specimen Description BLOOD LEFT ANTECUBITAL  Final   Special Requests   Final    BOTTLES DRAWN AEROBIC AND ANAEROBIC Blood Culture adequate volume   Culture   Final    NO GROWTH 1 DAY Performed at [facility], [place] [address] [place], [place] [address]    Report Status PENDING  Incomplete  Blood culture (routine x 2)     Status: None (Preliminary result)   Collection Time: 07/19/22 11:18 PM   Specimen: BLOOD LEFT FOREARM  Result Value Ref Range Status   Specimen Description BLOOD LEFT FOREARM  Final   Special Requests   Final    BOTTLES DRAWN AEROBIC AND ANAEROBIC Blood Culture results may not be optimal due to an excessive volume of blood received in culture bottles   Culture   Final    NO GROWTH 1 DAY Performed at [facility],  [place] [address] [place], [place] [address]    Report Status PENDING  Incomplete  MRSA Next Gen by PCR, Nasal     Status: Abnormal   Collection Time: 07/20/22  4:01 AM   Specimen: Nasal Mucosa; Nasal Swab  Result Value Ref Range Status   MRSA by PCR Next Gen DETECTED (A) NOT DETECTED Final    Comment: CRITICAL RESULT CALLED TO, READ BACK BY AND VERIFIED WITH: BRIANA LEAKE RN ON 11.30.23 AT 0818 BY EM (NOTE) The GeneXpert MRSA Assay (FDA  approved for NASAL specimens only), is one component of a comprehensive MRSA colonization surveillance program. It is not intended to diagnose MRSA infection nor to guide or monitor treatment for MRSA infections. Test performance is not FDA approved in patients less than 22 years old. Performed at [facility], [address], [place], [place] [address]          Radiology Studies: DG CHEST PORT 1 VIEW  Result Date: 07/20/2022 CLINICAL DATA:  Respiratory failure EXAM: PORTABLE CHEST 1 VIEW COMPARISON:  Chest radiograph dated 07/19/2022. FINDINGS: The heart is enlarged. Vascular calcifications are seen in the aortic arch. Left basilar atelectasis/airspace disease appears similar to prior exam. A small pleural effusion may contribute. There is mild right basilar atelectasis/airspace disease. There is no right pleural effusion. There is no pneumothorax. Degenerative changes are seen in the spine. IMPRESSION: 1. Left basilar atelectasis/airspace disease appears similar to prior exam. A small pleural effusion may contribute. 2. Mild right basilar atelectasis/airspace disease. Electronically Signed   By: Romona Curls M.D.   On: 07/20/2022 14:47   DG Chest Portable 1 View  Result Date: 07/19/2022 CLINICAL DATA:  sob EXAM: PORTABLE CHEST 1 VIEW COMPARISON:  Chest x-ray 05/03/2022. CT chest 05/01/2022 FINDINGS: The heart and mediastinal contours are unchanged. Aortic calcification. Persistent retrocardiac airspace opacity with associated elevated left  hemidiaphragm. No pulmonary edema. Likely trace left pleural effusion. No right pleural effusion. No pneumothorax. No acute osseous abnormality.  Intact sternotomy wires IMPRESSION: 1. Persistent retrocardiac airspace opacity with associated elevated left hemidiaphragm. 2. Likely trace left pleural effusion. 3.  Aortic Atherosclerosis (ICD10-I70.0). Electronically Signed   By: Tish Frederickson M.D.   On: 07/19/2022 22:05        Scheduled Meds:  apixaban  5 mg Oral BID   arformoterol  15 mcg Nebulization BID   atorvastatin  40 mg Oral QHS   bimatoprost  1 drop Both Eyes QHS   brimonidine  1 drop Both Eyes BID   budesonide  2 mL Inhalation BID   dorzolamide-timolol  1 drop Right Eye BID   furosemide  40 mg Intravenous BID   guaiFENesin  1,200 mg Oral BID   insulin aspart  0-15 Units Subcutaneous TID WC   ipratropium-albuterol  3 mL Inhalation TID   levothyroxine  125 mcg Oral Q0600   methylPREDNISolone (SOLU-MEDROL) injection  40 mg Intravenous BID   primidone  125 mg Oral q1800   primidone  250 mg Oral q morning   propranolol  20 mg Oral BID   Continuous Infusions:  azithromycin (ZITHROMAX) 500 mg in sodium chloride 0.9 % 250 mL IVPB 500 mg (07/21/22 0957)   cefTRIAXone (ROCEPHIN)  IV 2 g (07/21/22 1006)   vancomycin Stopped (07/21/22 1104)     LOS: 1 day    Time spent: 35 minutes    Dearius Hoffmann A Rudine Rieger, MD [facility]   If 7PM-7AM, please contact night-coverage [email or web address]  07/21/2022, 1:01 PM

## 2022-07-21 NOTE — Progress Notes (Signed)
PIV in R posterior forearm infiltrated with azithromycin.  [facility] pharmacy.  Hot packs applied to site and tylenol given.

## 2022-07-21 NOTE — Evaluation (Signed)
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 PMH: chronic diastolic CHF, COPD, CAD, CABG, AFlutter, hospitalization 9/23 for acute respiratory failure with hypercapnia & aspiration PNA, and prior to that at [facility]. CXR =  Left basilar atelectasis/airspace disease appears similar to prior exam. A small pleural effusion may contribute. Mild right basilar atelectasis/airspace disease. MVS 2006.    Assessment / Plan / Recommendation  Clinical Impression  Pt seen at bedside for assessment of swallow function and identification of least restrictive diet. Pt was awake upon arrival of SLP, and cooperative with evaluation. He presents with adequate natural dentition, missing 3 teeth. CN exam is unremarkable. Pt reports no difficulty swallowing. Oral care was completed. Suction was not set up in the room, but this would be a good idea I think. Following oral care, pt accepted trials of ice chips, thin liquid, puree, and solid textures. Decreased laryngeal elevation was noted per palpation. No immediate cough response, however, pt's breath sounds and cough seemed to worsen as PO trials continued. Given bedside presentation, continued NPO status is recommended at this time, until medical team and pt's daughter can  discuss goals of care together. SLP will continue to follow to provide education as needed.  SLP Visit Diagnosis: Dysphagia, unspecified (R13.10)    Aspiration Risk  Severe aspiration risk    Diet Recommendation NPO except meds   Medication Administration: Whole meds with puree    Other  Recommendations Oral Care Recommendations: Oral care QID Other Recommendations: Have oral suction available    Recommendations for follow up therapy are one component of a multi-disciplinary discharge planning process, led by the attending physician.  Recommendations may be updated based on patient status, additional functional criteria and insurance authorization.  Follow up Recommendations Other (comment) (TBD)      Assistance Recommended at Discharge Full supervision/assist  Functional Status Assessment Patient has had a recent decline in their functional status and/or demonstrates limited ability to make significant improvements in function in a reasonable and predictable amount of time   Frequency and Duration min 1 x/week  1 week;2 weeks       Prognosis Prognosis for Safe Diet Advancement: Fair Barriers to Reach Goals: Cognitive deficits; respiratory status   Swallow Study   General Date of Onset: 07/19/22 HPI: 88yo male admitted 07/19/22 with SOB. PMH: chronic diastolic CHF, COPD, CAD, CABG, AFlutter, hospitalization 9/23 for acute respiratory failure with hypercapnia & aspiration PNA, and prior to that at [facility]. CXR =  Left basilar atelectasis/airspace disease appears similar to prior exam. A small pleural effusion may contribute. Mild right basilar atelectasis/airspace disease. MVS 2006. Type of Study: Bedside Swallow Evaluation Previous Swallow Assessment: MBS in 2004 & 2006. Diet Prior to this Study: NPO Temperature Spikes Noted: No Respiratory Status: Nasal cannula History of  Recent Intubation: No Behavior/Cognition: Alert;Cooperative;Confused Oral Cavity Assessment: Within  Functional Limits Oral Care Completed by SLP: Yes Oral Cavity - Dentition: Adequate natural dentition (missing 3 teeth) Vision: Functional for self-feeding Self-Feeding Abilities: Able to feed self;Needs assist;Needs set up Patient Positioning: Upright in bed Baseline Vocal Quality: Normal Volitional Cough:  (fair) Volitional Swallow: Able to elicit    Oral/Motor/Sensory Function Overall Oral Motor/Sensory Function: Within functional limits   Ice Chips Ice chips: Within functional limits Presentation: Spoon   Thin Liquid Thin Liquid: Impaired Presentation: Straw Pharyngeal  Phase Impairments: Suspected delayed Swallow;Cough - Delayed;Decreased hyoid-laryngeal movement    Nectar Thick Nectar Thick Liquid: Not tested   Honey Thick Honey Thick Liquid: Not tested   Puree Puree: Impaired Presentation: Spoon Pharyngeal Phase Impairments: Suspected delayed Swallow;Decreased hyoid-laryngeal movement;Cough - Delayed   Solid     Solid: Impaired Presentation: Self Fed Oral Phase Functional Implications: Prolonged oral transit Pharyngeal Phase Impairments: Suspected delayed Swallow;Decreased hyoid-laryngeal movement;Cough - Delayed     Angellica Maddison B. Murvin Natal, [facility], CCC-SLP Speech Language Pathologist Office: [phone]  Leigh Aurora 07/21/2022,10:36 AM

## 2022-07-21 NOTE — Progress Notes (Addendum)
CSW spoke with pt in room, able to give talk briefly, said his daughter would be back soon.  CSW attempted to call daugher [name], no answer. Daleen Squibb, MSW, LCSW 12/1/20232:07 PM   1515: Still unable to reach daughter by phone.  Daughter not at hospital.   Daleen Squibb, MSW, LCSW 12/1/20233:17 PM

## 2022-07-21 NOTE — Evaluation (Signed)
Occupational Therapy Evaluation Patient Details Name: Billy Smith MRN: [id number] DOB: 11/07/1932 Today's Date: 07/21/2022   History of Present Illness Pt is an 88 y/o M admitted on 07/19/22 after pt presented from SNF with c/o SOB after developing a cough ~2 days prior. Chest x-ray concerning for PNA in L lung. PMH: chronic diastolic CHF, COPD, CAD s/p CABG, a-flutter on eliquis   Clinical Impression   Patient is currently requiring assistance with ADLs including up to total assist with bed level Lower body ADLs, max-total assist with Upper body ADLs including self-feeding,  as well as  up to maximum assist with bed mobility and moderate assist with functional transfers to [facility].  Current level of function may be below patient's typical baseline, however pt is a poor historian and no family present.   During this evaluation, RT elbow pain, patient was limited by generalized weakness, impaired activity tolerance, and cognitive deficits as well as severe hardness of hearing, all of which has the potential to impact patient's safety and independence during functional mobility, as well as performance for ADLs.  Patient was admitted from SNF, and was unable to give much information about function prior to his SNF stay.  Patient demonstrates fair rehab potential, and should benefit from continued skilled occupational therapy services while in acute care to maximize safety, independence and quality of life at home.  Continued occupational therapy services in a SNF setting prior to return home is recommended.  ?      Recommendations for follow up therapy are one component of a multi-disciplinary discharge planning process, led by the attending physician.  Recommendations may be updated based on patient status, additional functional criteria and insurance authorization.   Follow Up Recommendations  Skilled nursing-[facility] (<3 hours/day)     Assistance Recommended at Discharge Frequent or  constant Supervision/Assistance  Patient can return home with the following Two people to help with walking and/or transfers;A lot of help with bathing/dressing/bathroom;Assist for transportation;Direct supervision/assist for financial management;Assistance with cooking/housework;Help with stairs or ramp for entrance;Direct supervision/assist for medications management    Functional Status Assessment  Patient has had a recent decline in their functional status and demonstrates the ability to make significant improvements in function in a reasonable and predictable amount of time.  Equipment Recommendations  Other (comment) (Will defer to post-acute recommendations)    Recommendations for Other Services       Precautions / Restrictions Precautions Precautions: Fall Restrictions Weight Bearing Restrictions: No      Mobility Bed Mobility Overal bed mobility: Needs Assistance Bed Mobility: Supine to Sit, Sit to Supine     Supine to sit: HOB elevated, Mod assist Sit to supine: Max assist   General bed mobility comments: Max multimodal cuing to initiate & complete supine>sit; able to follow commands to use bed rails to assist with increased time for processing.    Transfers                          Balance Overall balance assessment: Needs assistance Sitting-balance support: Feet supported, Bilateral upper extremity supported Sitting balance-Leahy Scale: Fair Sitting balance - Comments: close supervision static sitting   Standing balance support: Bilateral upper extremity supported, During functional activity, Reliant on assistive device for balance Standing balance-Leahy Scale: Poor                             ADL either performed or  assessed with clinical judgement   ADL Overall ADL's : Needs assistance/impaired Eating/Feeding: Bed level;Total assistance Eating/Feeding Details (indicate cue type and reason): Pt asking for water, but refusing to try to  hold cup and bring to mouth in either hand. Required Total Assist to hold cup and hold straw to mouth.   Grooming Details (indicate cue type and reason): Pt refused. Anticipate assistance needed due to UE limitations. Upper Body Bathing: Maximal assistance;Sitting   Lower Body Bathing: Total assistance;Bed level   Upper Body Dressing : Maximal assistance;Sitting   Lower Body Dressing: Total assistance;Bed level;Sitting/lateral leans   Toilet Transfer: Minimal assistance;Moderate assistance;Rolling walker (2 wheels);Cueing for safety;Cueing for sequencing Toilet Transfer Details (indicate cue type and reason): Pt stood from elevated EOB to RW with Min As and cues. Pt took 4 lateral step with RW and Min-Moderate assist. Quick to fatigue and assisted back to EOB with Min As due to lack of eccentric control. Toileting- Clothing Manipulation and Hygiene: Total assistance;Bed level Toileting - Clothing Manipulation Details (indicate cue type and reason): On external catheter     Functional mobility during ADLs: Rolling walker (2 wheels);Minimal assistance;Moderate assistance       Vision Ability to See in Adequate Light: 1 Impaired       Perception     Praxis      Pertinent Vitals/Pain Pain Assessment Pain Assessment: PAINAD Faces Pain Scale: Hurts even more Breathing: occasional labored breathing, Pember period of hyperventilation Negative Vocalization: repeated troubled calling out, loud moaning/groaning, crying Facial Expression: sad, frightened, frown Body Language: tense, distressed pacing, fidgeting Consolability: distracted or reassured by voice/touch PAINAD Score: 6 Pain Location: RT elebow Pain Intervention(s): Repositioned, Ice applied, Limited activity within patient's tolerance, Monitored during session (Elevated with ice pack. Noted hot pack on bed but not on elbow, as OT entered room but elbow was already hot and swollen, so chose ice)     Hand Dominance  (Pt would  not say)   Extremity/Trunk Assessment Upper Extremity Assessment Upper Extremity Assessment: RUE deficits/detail;LUE deficits/detail;Generalized weakness RUE Deficits / Details: Very guarded with movement. RT welbow swelling and pain. Not lifting arm to engage in ADLs. RUE: Unable to fully assess due to pain RUE Coordination: decreased fine motor LUE Deficits / Details: Very weak, not using functionally for ADLs despite cues.           Communication Communication Communication: HOH   Cognition Arousal/Alertness: Awake/alert Behavior During Therapy: Flat affect Overall Cognitive Status: No family/caregiver present to determine baseline cognitive functioning Area of Impairment: Orientation, Memory, Awareness, Problem solving, Safety/judgement, Following commands, Attention                 Orientation Level: Disoriented to, Time, Situation Current Attention Level: Selective (easily distracted) Memory: Decreased recall of precautions, Decreased [place]-term memory Following Commands: Follows one step commands inconsistently Safety/Judgement: Decreased awareness of safety, Decreased awareness of deficits Awareness: Emergent Problem Solving: Slow processing, Requires verbal cues General Comments: Unable to give full DOB "I can't remember nothing."  Gave birth month and day with increased time. Oriented to person and place.     General Comments       Exercises     Shoulder Instructions      Home Living Family/patient expects to be discharged to:: Skilled nursing facility  Prior Functioning/Environment                          OT Problem List: Decreased strength;Decreased coordination;Pain;Decreased cognition;Decreased range of motion;Decreased safety awareness;Decreased activity tolerance;Impaired balance (sitting and/or standing);Decreased knowledge of use of DME or AE;Obesity;Impaired  vision/perception;Impaired UE functional use      OT Treatment/Interventions: Self-care/ADL training;Therapeutic exercise;Therapeutic activities;Cognitive remediation/compensation;Visual/perceptual remediation/compensation;Patient/family education;DME and/or AE instruction;Balance training    OT Goals(Current goals can be found in the care plan section) Acute Rehab OT Goals Patient Stated Goal: Relief from elbow pain OT Goal Formulation: With patient Time For Goal Achievement: 08/04/22 Potential to Achieve Goals: Fair ADL Goals Pt Will Perform Grooming: sitting;with set-up;with supervision (EOB with good balance) Pt Will Perform Upper Body Bathing: with min guard assist;sitting (EOB) Pt Will Perform Lower Body Bathing: with mod assist;sitting/lateral leans;sit to/from stand;with adaptive equipment Pt Will Perform Upper Body Dressing: with min guard assist;sitting;with adaptive equipment Pt Will Transfer to Toilet: with supervision;ambulating Pt/caregiver will Perform Home Exercise Program: Increased ROM;Increased strength;Both right and left upper extremity;With minimal assist;With Supervision  OT Frequency: Min 2X/week    Co-evaluation              AM-PAC OT "6 Clicks" Daily Activity     Outcome Measure Help from another person eating meals?: Total Help from another person taking care of personal grooming?: A Lot Help from another person toileting, which includes using toliet, bedpan, or urinal?: Total Help from another person bathing (including washing, rinsing, drying)?: A Lot Help from another person to put on and taking off regular upper body clothing?: A Lot Help from another person to put on and taking off regular lower body clothing?: Total 6 Click Score: 9   End of Session Equipment Utilized During Treatment: Gait belt;Rolling walker (2 wheels) Nurse Communication: Mobility status  Activity Tolerance: Patient limited by fatigue Patient left: in bed;with call  bell/phone within reach;with bed alarm set  OT Visit Diagnosis: Unsteadiness on feet (R26.81);Other symptoms and signs involving cognitive function;Pain;Feeding difficulties (R63.3);Muscle weakness (generalized) (M62.81) Pain - Right/Left: Right Pain - part of body: Arm                Time: 1115-1145 OT Time Calculation (min): 30 min Charges:  OT General Charges $OT Visit: 1 Visit OT Evaluation $OT Eval Low Complexity: 1 Low OT Treatments $Therapeutic Activity: 8-22 mins  Victorino Dike, OT Acute Rehab Services Office: [phone] 07/21/2022  Theodoro Clock 07/21/2022, 12:58 PM

## 2022-07-21 NOTE — Progress Notes (Signed)
Rounding Note    Patient Name: Billy Smith Date of Encounter: 07/21/2022  [facility] Cardiologist: Verne Carrowhristopher McAlhany, MD    Subjective    Billy Smith is seen for follow up of his respiratory failure , hosp. Acquired pneumonia,   There was a question of CHF yesterday  BNP was normal ( on 2 separate blood draws)  Had diffuse edema  We gave some lasix to see if it helped I/O have not been recorded ( or he didn't keep his urine )   Is putting out some urine , not recorded yet   Creatinine is better today  Will keep lasix going for now  Very difficult to assess this patient , hard of hearing ,  coughs constantly Not very cooperative at this point - hopefully will improve      Inpatient Medications    Scheduled Meds:  apixaban  5 mg Oral BID   atorvastatin  40 mg Oral QHS   bimatoprost  1 drop Both Eyes QHS   brimonidine  1 drop Both Eyes BID   budesonide  2 mL Inhalation BID   dorzolamide-timolol  1 drop Right Eye BID   furosemide  40 mg Intravenous BID   guaiFENesin  1,200 mg Oral BID   insulin aspart  0-15 Units Subcutaneous TID WC   ipratropium-albuterol  3 mL Inhalation TID   levothyroxine  125 mcg Oral Q0600   methylPREDNISolone (SOLU-MEDROL) injection  40 mg Intravenous BID   primidone  125 mg Oral q1800   primidone  250 mg Oral q morning   propranolol  20 mg Oral BID   Continuous Infusions:  azithromycin (ZITHROMAX) 500 mg in sodium chloride 0.9 % 250 mL IVPB 500 mg (07/21/22 0957)   cefTRIAXone (ROCEPHIN)  IV 2 g (07/21/22 1006)   vancomycin 750 mg (07/21/22 0254)   PRN Meds: acetaminophen **OR** acetaminophen, albuterol, ondansetron **OR** ondansetron (ZOFRAN) IV   Vital Signs    Vitals:   07/20/22 1716 07/20/22 2050 07/21/22 0115 07/21/22 0423  BP:      Pulse:   76 87  Resp:   15 18  Temp:      TempSrc:    Oral  SpO2: 90% 100% 100% 100%  Weight:      Height:        Intake/Output Summary (Last 24 hours) at 07/21/2022  1014 Last data filed at 07/20/2022 2232 Gross per 24 hour  Intake 0 ml  Output --  Net 0 ml      07/19/2022    9:44 PM 05/09/2022    4:00 AM 05/08/2022    3:25 AM  Last 3 Weights  Weight (lbs) 227 lb 1.2 oz 225 lb 5 oz 222 lb 10.6 oz  Weight (kg) 103 kg 102.2 kg 101 kg      Telemetry     - Personally Reviewed  ECG     - Personally Reviewed  Physical Exam   GEN: elderly male,  mod. Respiratory difficulty , minimally cooperative  Neck: JVP is difficult to assess.   Cardiac: RRR, , difficult to hear heart sounds due to respiratory sounds   Respiratory: Clear to auscultation bilaterally. GI: Soft, nontender, non-distended  MS: No edema; No deformity. Neuro:  Nonfocal  Psych: Normal affect   Labs    High Sensitivity Troponin:   Recent Labs  Lab 07/19/22 2129 07/19/22 2317  TROPONINIHS 24* 24*     Chemistry Recent Labs  Lab 07/19/22 2129 07/19/22 2327 07/20/22 0902  07/21/22 0350  NA 138 135 140 143  K 3.9 4.0 4.1 4.5  CL 99  --  97* 98  CO2 25  --  29 31  GLUCOSE 124*  --  139* 133*  BUN 35*  --  35* 38*  CREATININE 1.52*  --  1.38* 1.28*  CALCIUM 8.8*  --  8.6* 9.1  PROT 7.2  --   --   --   ALBUMIN 3.5  --   --   --   AST 32  --   --   --   ALT 23  --   --   --   ALKPHOS 79  --   --   --   BILITOT 0.6  --   --   --   GFRNONAA 44*  --  49* 54*  ANIONGAP 14  --  14 14    Lipids No results for input(s): "CHOL", "TRIG", "HDL", "LABVLDL", "LDLCALC", "CHOLHDL" in the last 168 hours.  Hematology Recent Labs  Lab 07/19/22 2129 07/19/22 2327 07/20/22 0902 07/21/22 0350  WBC 9.7  --  8.7 7.9  RBC 3.83*  --  3.36* 3.23*  HGB 13.8 12.6* 12.2* 11.7*  HCT 40.5 37.0* 35.0* 34.6*  MCV 105.7*  --  104.2* 107.1*  MCH 36.0*  --  36.3* 36.2*  MCHC 34.1  --  34.9 33.8  RDW 13.7  --  13.7 13.9  PLT 191  --  153 143*   Thyroid No results for input(s): "TSH", "FREET4" in the last 168 hours.  BNP Recent Labs  Lab 07/19/22 2129 07/20/22 0902  BNP 84.6 79.7     DDimer No results for input(s): "DDIMER" in the last 168 hours.   Radiology    DG CHEST PORT 1 VIEW  Result Date: 07/20/2022 CLINICAL DATA:  Respiratory failure EXAM: PORTABLE CHEST 1 VIEW COMPARISON:  Chest radiograph dated 07/19/2022. FINDINGS: The heart is enlarged. Vascular calcifications are seen in the aortic arch. Left basilar atelectasis/airspace disease appears similar to prior exam. A small pleural effusion may contribute. There is mild right basilar atelectasis/airspace disease. There is no right pleural effusion. There is no pneumothorax. Degenerative changes are seen in the spine. IMPRESSION: 1. Left basilar atelectasis/airspace disease appears similar to prior exam. A small pleural effusion may contribute. 2. Mild right basilar atelectasis/airspace disease. Electronically Signed   By: Romona Curls M.D.   On: 07/20/2022 14:47   DG Chest Portable 1 View  Result Date: 07/19/2022 CLINICAL DATA:  sob EXAM: PORTABLE CHEST 1 VIEW COMPARISON:  Chest x-ray 05/03/2022. CT chest 05/01/2022 FINDINGS: The heart and mediastinal contours are unchanged. Aortic calcification. Persistent retrocardiac airspace opacity with associated elevated left hemidiaphragm. No pulmonary edema. Likely trace left pleural effusion. No right pleural effusion. No pneumothorax. No acute osseous abnormality.  Intact sternotomy wires IMPRESSION: 1. Persistent retrocardiac airspace opacity with associated elevated left hemidiaphragm. 2. Likely trace left pleural effusion. 3.  Aortic Atherosclerosis (ICD10-I70.0). Electronically Signed   By: Tish Frederickson M.D.   On: 07/19/2022 22:05    Cardiac Studies     Patient Profile     88 y.o. male  with acute respiratory distress   Assessment & Plan     Acute respiratory distress:    Billy Smith with Speech pathology has seen him and it appears that he is aspirating .  He coughed more as he ate / drank .    She does not think that he would tolerate a fiberoptic visual  evaluation at this  point   The issues of aspiration likely needs further evaluation after he improves  2.  Possible acute diastolic CHF.  Had normal LV systolic function and grade II DD on echo from Sept. 12, 2023.  Mild pulmonary HTN.   Is diffusely edematous.   Will cont lasix IV BID for now      3.  Possible pneumonia:  plans per primary team            For questions or updates, please contact [facility] Please consult [email or web address] for contact info under        Signed, Mertie Moores, MD  07/21/2022, 10:14 AM

## 2022-07-21 NOTE — Progress Notes (Addendum)
Patient placed on CPAP with 3L O2 bled in at this time.

## 2022-07-22 DIAGNOSIS — J189 Pneumonia, unspecified organism: Secondary | ICD-10-CM | POA: Diagnosis not present

## 2022-07-22 DIAGNOSIS — Z515 Encounter for palliative care: Secondary | ICD-10-CM

## 2022-07-22 LAB — GLUCOSE, CAPILLARY
Glucose-Capillary: 83 mg/dL (ref 70–99)
Glucose-Capillary: 95 mg/dL (ref 70–99)
Glucose-Capillary: 151 mg/dL — ABNORMAL HIGH (ref 70–99)
Glucose-Capillary: 81 mg/dL (ref 70–99)

## 2022-07-22 LAB — CBC
HCT: 37.6 % — ABNORMAL LOW (ref 39.0–52.0)
Hemoglobin: 12.4 g/dL — ABNORMAL LOW (ref 13.0–17.0)
MCH: 35.5 pg — ABNORMAL HIGH (ref 26.0–34.0)
MCHC: 33 g/dL (ref 30.0–36.0)
MCV: 107.7 fL — ABNORMAL HIGH (ref 80.0–100.0)
Platelets: 186 10*3/uL (ref 150–400)
RBC: 3.49 MIL/uL — ABNORMAL LOW (ref 4.22–5.81)
RDW: 13.7 % (ref 11.5–15.5)
WBC: 8.9 10*3/uL (ref 4.0–10.5)
nRBC: 0 % (ref 0.0–0.2)

## 2022-07-22 LAB — BASIC METABOLIC PANEL
Anion gap: 11 (ref 5–15)
BUN: 39 mg/dL — ABNORMAL HIGH (ref 8–23)
CO2: 28 mmol/L (ref 22–32)
Calcium: 9.1 mg/dL (ref 8.9–10.3)
Chloride: 104 mmol/L (ref 98–111)
Creatinine, Ser: 1.25 mg/dL — ABNORMAL HIGH (ref 0.61–1.24)
GFR, Estimated: 56 mL/min — ABNORMAL LOW (ref >60)
Glucose, Bld: 104 mg/dL — ABNORMAL HIGH (ref 70–99)
Potassium: 4.3 mmol/L (ref 3.5–5.1)
Sodium: 143 mmol/L (ref 135–145)

## 2022-07-22 MED ORDER — SENNOSIDES-DOCUSATE SODIUM 8.6-50 MG PO TABS
1.0000 | ORAL_TABLET | Freq: Every day | ORAL | Status: DC
  Administered 2022-07-22 – 2022-07-26 (×5): 1 via ORAL
  Filled 2022-07-22 (×5): qty 1

## 2022-07-22 MED ORDER — FOOD THICKENER (SIMPLYTHICK)
10.0000 | ORAL | Status: DC | PRN
  Filled 2022-07-22: qty 10

## 2022-07-22 MED ORDER — DEXTROMETHORPHAN POLISTIREX ER 30 MG/5ML PO SUER
15.0000 mg | Freq: Two times a day (BID) | ORAL | Status: DC | PRN
  Administered 2022-07-22 – 2022-07-25 (×2): 15 mg via ORAL
  Filled 2022-07-22 (×3): qty 5

## 2022-07-22 NOTE — Plan of Care (Signed)

## 2022-07-22 NOTE — Progress Notes (Signed)
Mobility Specialist: Progress Note   07/22/22 1431  Mobility  Activity Transferred from bed to chair  Level of Assistance Maximum assist, patient does 25-49%  Assistive Device Front wheel walker  Distance Ambulated (ft) 2 ft  Activity Response Tolerated well  Mobility Referral Yes  $Mobility charge 1 Mobility   Post-Mobility: 73 HR, 97% SpO2  Pt received in the bed and agreeable to mobility. Pt on 2 L/min Irena. Session focused on transferring to the chair for lunch. MaxA for bed mobility and minA to stand. No c/o throughout. Pt is in the chair with call bell at his side. Chair alarm is on.   Keyri Salberg Mobility Specialist Please contact via SecureChat or Rehab office at [phone]

## 2022-07-22 NOTE — Progress Notes (Signed)
RT placed on CPAP HS. 4L O2 bleed in needed. Patient tolerating well at this time.

## 2022-07-22 NOTE — Consult Note (Signed)
Palliative Medicine Inpatient Consult Note  Consulting Provider: Dr. Tyrell Antonio  Reason for consult:   [facility] Consult  Reason for Consult? Goals f care   07/22/2022  HPI:  Per intake H&P --> 88 year old with past medical history significant for chronic diastolic heart failure, history of COPD not on oxygen at home, CAD status post CABG, A-flutter on Eliquis prior hospitalization September for acute respiratory failure with hypercapnia in the setting of aspiration pneumonia.   Palliative care is familiar with Hikeem from his admission in September, we have been asked to re-engage for additional goals of care conversations.   Clinical Assessment/Goals of Care:  *Please note that this is a verbal dictation therefore any spelling or grammatical errors are due to the "[place] One" system interpretation.  I have reviewed medical records including EPIC notes, labs and imaging, received report from bedside RN, assessed the patient who is lying in bed alert and oriented to person, place, and aware that he is "sick".    I met with Jarmar and his daughter, [name] to further discuss diagnosis prognosis, GOC, EOL wishes, disposition and options.   I introduced Palliative Medicine as specialized medical care for people living with serious illness. It focuses on providing relief from the symptoms and stress of a serious illness. The goal is to improve quality of life for both the patient and the family.  Medical History Review and Understanding:  Reviewed patients history of COPD, coronary artery disease, atrial fibrillation/flutter, diastolic heart failure, and recurrent pneumonias.  Social History:  Briley is from [place], [place]. He is a widower as his wife, Inez Catalina passed away two (+) years ago. Patient shares that his wife's passing was fairly devastating. Jeffrey has one daughter, Olin Hauser and no grandchildren. 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 He got great enjoyment out of fishing and being outdoors. He is a man of faith and practices within the [facility] denomination.   Functional and Nutritional State:  Has transitioned to a long term resident at [facility] over the past few months. He has been able to mobilize and perform some bADLs. His daughter shares that she is able to take him out the facility weekly to engage with family and go out to eat. He did maintain a good appetite.   Advance Directives:  A detailed discussion was had today regarding advanced directives.  These can be found in [place].  Patient's daughter, Olin Hauser is his primary Media planner.    Code Status: Concepts specific to code status, artifical feeding and hydration, continued IV antibiotics and rehospitalization was had.  The difference between a aggressive medical intervention path  and a palliative comfort care path for this patient at this time was had.   Austyn is an established DNAR/DNI - this was reconfirmed.  Provided a MOST form and reviewed it with patient and his daughter for completion.   Discussion:  I reviewed with Aydien and Olin Hauser the significance of recurrent pneumonia.  We discussed the various reasons why someone could have pneumonia inclusive of aspirational events.  Olin Hauser does not feel it is this though does recognize that he is due to have a modified barium swallow study to gain further clarity.  We did discuss the differences again between palliative care and hospice care.   Palliative care is specialized medical care for people living with a serious illness, such as cancer, heart failure, COPD, Alzheimer's dementia, etc. Patients in palliative care may receive medical care for their symptoms, or  palliative care, along with treatment intended to cure their serious illness   Hospice care focuses on the care, comfort, and quality of life of a person with a serious illness who is approaching the end of life.At some point, it may not be  possible to cure a serious illness, or a patient may choose not to undergo certain treatments. Hospice is designed for this situation.  Candise Bowens that she is "very realistic" about his present situation.  She expresses though that he had been doing better over the past few months and therefore would ideally like him to get back to that level of functioning.  Olin Hauser also vocalizes that we are around the holiday season and this makes these decisions especially difficult.  Olin Hauser and I reviewed the reasons why she did not pursue outpatient palliative support being that her insurance is 1 whereby she worries if patient is labeled as under palliative support they will not offer PT and OT.  She expresses the plan to transition Keldric to full Medicaid Medicare in January 2024.  At that time he could get palliative services.  We discussed that if things should not go well and if he should decline or deteriorate further what comfort emphasis looks like.  We further discussed that end-of-life/hospice care could be done at [facility] if it came to that.  For now the goals are for improvement.  Discussed the importance of continued conversation with family and their  medical providers regarding overall plan of care and treatment options, ensuring decisions are within the context of the patients values and GOCs.  Decision Maker: Vuncannon,[name] (Daughter): [phone] (Home Phone)   SUMMARY OF RECOMMENDATIONS   DNAR/DNI --> For the time being full scope of medical care patients daughter would like to make it through the holiday season  An open and honest conversation regarding patient's clinical situation and the concern for recurrent pneumonia --> plan for MBS to identify whether or not there is an aspiration component  Patient's daughter would like to continue with the current plan --> the hope for patient to transition back to [facility]  When insurance shifts in January patient's daughter is open to the idea  of outpatient palliative support  Patient will benefit from aggressive pulmonary toileting via [employer] --> will request for respiratory evaluation  Ongoing palliative support  Code Status/Advance Care Planning: DNAR/DNI  Palliative Prophylaxis:  Aspiration, Bowel Regimen, Delirium Protocol, Frequent Pain Assessment, Oral Care, Palliative Wound Care, and Turn Reposition  Additional Recommendations (Limitations, Scope, Preferences): Continue current care  Psycho-social/Spiritual:  Desire for further Chaplaincy support: Not at this time Additional Recommendations: Education on recurrent aspiration   Prognosis: Patient with multiple chronic comorbidities and recurrent readmissions places him at a high 63-monthmortality risk   Discharge Planning: Discharge to [facility] once medically optimized.   Vitals:   07/22/22 0844 07/22/22 0939  BP:  (!) 123/56  Pulse:  63  Resp:    Temp:    SpO2: 96%     Intake/Output Summary (Last 24 hours) at 07/22/2022 1003 Last data filed at 07/22/2022 01638Gross per 24 hour  Intake 810.36 ml  Output 1900 ml  Net -1089.64 ml   Last Weight  Most recent update: 07/22/2022  5:58 AM    Weight  100.9 kg (222 lb 7.1 oz)            Gen: Elderly chronically ill appearing Caucasian male in mild distress HEENT: Drymucous membranes CV: Regular rate and irregular rhythm PULM: On 2LPM [place], (+)  wheeze, (+) deep breathing ABD: soft/nontender EXT: (+) Generalized edema Neuro: Alert and oriented x2-3 -very hard of hearing  PPS: 50%   This conversation/these recommendations were discussed with patient primary care team, Dr. Tyrell Antonio  Billing based on MDM: High  Problems Addressed: One acute or chronic illness or injury that poses a threat to life or bodily function  Amount and/or Complexity of Data: Category 3:Discussion of management or test interpretation with external physician/other qualified health care professional/appropriate source (not  separately reported)  Risks: Decision regarding hospitalization or escalation of hospital care and Decision not to resuscitate or to de-escalate care because of poor prognosis ______________________________________________________ [place] Team Team Cell Phone: [phone] Please utilize secure chat with additional questions, if there is no response within 30 minutes please call the above phone number  Palliative Medicine Team providers are available by phone from 7am to 7pm daily and can be reached through the team cell phone.  Should this patient require assistance outside of these hours, please call the patient's attending physician.

## 2022-07-22 NOTE — Progress Notes (Signed)
Rounding Note    Patient Name: Billy Smith Date of Encounter: 07/22/2022   HeartCare Cardiologist: Verne Carrow, MD   Subjective   Feels better. Daughter notes cough is "junky"  Inpatient Medications    Scheduled Meds:  apixaban  5 mg Oral BID   arformoterol  15 mcg Nebulization BID   atorvastatin  40 mg Oral QHS   bimatoprost  1 drop Both Eyes QHS   brimonidine  1 drop Both Eyes BID   budesonide  2 mL Inhalation BID   Chlorhexidine Gluconate Cloth  6 each Topical Q0600   dorzolamide-timolol  1 drop Right Eye BID   furosemide  40 mg Intravenous BID   guaiFENesin  1,200 mg Oral BID   insulin aspart  0-15 Units Subcutaneous TID WC   ipratropium-albuterol  3 mL Inhalation TID   levothyroxine  125 mcg Oral Q0600   methylPREDNISolone (SOLU-MEDROL) injection  40 mg Intravenous BID   mupirocin ointment  1 Application Nasal BID   primidone  125 mg Oral q1800   primidone  250 mg Oral q morning   propranolol  20 mg Oral BID   Continuous Infusions:  azithromycin (ZITHROMAX) 500 mg in sodium chloride 0.9 % 250 mL IVPB 500 mg (07/22/22 1045)   cefTRIAXone (ROCEPHIN)  IV 2 g (07/22/22 0933)   vancomycin Stopped (07/22/22 0245)   PRN Meds: acetaminophen **OR** acetaminophen, albuterol, dextromethorphan, ondansetron **OR** ondansetron (ZOFRAN) IV   Vital Signs    Vitals:   07/22/22 0500 07/22/22 0724 07/22/22 0844 07/22/22 0939  BP:  (!) 114/56  (!) 123/56  Pulse:  (!) 58  63  Resp:  13    Temp:  97.6 F (36.4 C)    TempSrc:  Oral    SpO2:  98% 96%   Weight: 100.9 kg     Height:        Intake/Output Summary (Last 24 hours) at 07/22/2022 1120 Last data filed at 07/22/2022 0648 Gross per 24 hour  Intake 810.36 ml  Output 1900 ml  Net -1089.64 ml      07/22/2022    5:00 AM 07/19/2022    9:44 PM 05/09/2022    4:00 AM  Last 3 Weights  Weight (lbs) 222 lb 7.1 oz 227 lb 1.2 oz 225 lb 5 oz  Weight (kg) 100.9 kg 103 kg 102.2 kg      Telemetry     NSR - Personally Reviewed  ECG    none- Personally Reviewed  Physical Exam   GEN: No acute distress.   Neck: No JVD Cardiac: RRR, no murmurs, rubs, or gallops.  Respiratory: Clear to auscultation bilaterally. GI: Soft, nontender, non-distended  MS: No edema; No deformity. Neuro:  Nonfocal  Psych: Normal affect   Labs    High Sensitivity Troponin:   Recent Labs  Lab 07/19/22 2129 07/19/22 2317  TROPONINIHS 24* 24*     Chemistry Recent Labs  Lab 07/19/22 2129 07/19/22 2327 07/20/22 0902 07/21/22 0350 07/22/22 0753  NA 138   < > 140 143 143  K 3.9   < > 4.1 4.5 4.3  CL 99  --  97* 98 104  CO2 25  --  29 31 28   GLUCOSE 124*  --  139* 133* 104*  BUN 35*  --  35* 38* 39*  CREATININE 1.52*  --  1.38* 1.28* 1.25*  CALCIUM 8.8*  --  8.6* 9.1 9.1  PROT 7.2  --   --   --   --  ALBUMIN 3.5  --   --   --   --   AST 32  --   --   --   --   ALT 23  --   --   --   --   ALKPHOS 79  --   --   --   --   BILITOT 0.6  --   --   --   --   GFRNONAA 44*  --  49* 54* 56*  ANIONGAP 14  --  14 14 11    < > = values in this interval not displayed.    Lipids No results for input(s): "CHOL", "TRIG", "HDL", "LABVLDL", "LDLCALC", "CHOLHDL" in the last 168 hours.  Hematology Recent Labs  Lab 07/20/22 0902 07/21/22 0350 07/22/22 0753  WBC 8.7 7.9 8.9  RBC 3.36* 3.23* 3.49*  HGB 12.2* 11.7* 12.4*  HCT 35.0* 34.6* 37.6*  MCV 104.2* 107.1* 107.7*  MCH 36.3* 36.2* 35.5*  MCHC 34.9 33.8 33.0  RDW 13.7 13.9 13.7  PLT 153 143* 186   Thyroid No results for input(s): "TSH", "FREET4" in the last 168 hours.  BNP Recent Labs  Lab 07/19/22 2129 07/20/22 0902  BNP 84.6 79.7    DDimer No results for input(s): "DDIMER" in the last 168 hours.   Radiology    DG CHEST PORT 1 VIEW  Result Date: 07/20/2022 CLINICAL DATA:  Respiratory failure EXAM: PORTABLE CHEST 1 VIEW COMPARISON:  Chest radiograph dated 07/19/2022. FINDINGS: The heart is enlarged. Vascular calcifications are seen in  the aortic arch. Left basilar atelectasis/airspace disease appears similar to prior exam. A small pleural effusion may contribute. There is mild right basilar atelectasis/airspace disease. There is no right pleural effusion. There is no pneumothorax. Degenerative changes are seen in the spine. IMPRESSION: 1. Left basilar atelectasis/airspace disease appears similar to prior exam. A small pleural effusion may contribute. 2. Mild right basilar atelectasis/airspace disease. Electronically Signed   By: Zerita Boers M.D.   On: 07/20/2022 14:47    Cardiac Studies   none  Patient Profile     88 y.o. male admitted with sob probable pneumonia and ?diastolic CHF  Assessment & Plan    Volume overload - his weight is down. Daughter notes that his "best weight" is around 215. He is down to 222 from 227. Continue diuresis Probable pneumonia - he is coughing about thick green phlegm per the daughter. Continue anti-biotics.  Atrial flutter - he is maintaining NSR.      For questions or updates, please contact [facility] Please consult [email or web address] for contact info under        Signed, Cristopher Peru, MD  07/22/2022, 11:20 AM

## 2022-07-22 NOTE — Progress Notes (Signed)
PROGRESS NOTE    Billy Smith  [id number] DOB: 16-Apr-1933 DOA: 07/19/2022 PCP: Nicoletta Dress, MD   Brief Narrative: 88 year old with past medical history significant for chronic diastolic heart failure, history of COPD not on oxygen at home, CAD status post CABG, A-flutter on Eliquis prior hospitalization September for acute respiratory failure with hypercapnia in the setting of aspiration pneumonia.  Prior to that hospitalization he was hospitalized at [facility] for pneumonia as well.  He presented with worsening cough over the last 2 days, worsening hypoxemia.  Evaluation in the ED chest x-ray raise concern for pneumonia.  Patient admitted for pneumonia.   Assessment & Plan:   Principal Problem:   HCAP (healthcare-associated pneumonia) Active Problems:   Acute metabolic encephalopathy   Atrial flutter (HCC)   COPD (chronic obstructive pulmonary disease) (HCC)   Coronary artery disease   Acute respiratory failure with hypoxia (HCC)   Chronic diastolic CHF (congestive heart failure) (HCC)   1-Acute  Hypoxic Respiratory Failure associated with pneumonia and acute COPD exacerbation: -Concern for aspiration pneumonia, a speech has been consulted. -MRSA PCR psotive.  -Continue with  vancomycin,  Azithro. Change cefepime to ceftriaxone to prevent encephalopathy/  -Continue with Solumedrol IV -Continue with Nebulizer treatment.  -Continue with guaifenesin,  brovana.  -Evaluated by speech 12/01, concern for aspiration. Keep NPO for now.  -Speech evaluation 12/02: plan to proceed regular, Nectar Thick liquid. Daughter prefer to defer MBS/  Flutter valve.   Acute metabolic encephalopathy: -in setting of infection, acute illness.  -Support care.  -ABG PH compensated.  Taper down oxygen, Oxygen sat goal 90--% Change cefepime to ceftriaxone to avoid encephalopathy.  He is more alert, follows command.   AKI: Creatinine 1.0 Presented  with a creatinine 1.5 Continue to monitor  on lasix.   History of a flutter/A-fib: -Continue with Eliquis  CAD status post CABG: -Continue with Lipitor.   Acute on Chronic diastolic heart failure: He recently had increase torsemide dose to 60 mg daily Cardiology have been consulted.  Continue with IV lasix 40 mg IV BID.  Daily weight. 227---222 Negative 1 l Weight on discharge last hospitalization 224.  Goals of Care:  DNR/DNI appropriately.  Palliative care consulted.  Daughter wants to try to get him to his prior level of health.   Essential tremor; Continue with Propanolol and and Primidone.  Hypothyroidism; Continue with Synthroid.   Sleep Apnea; CPAP ordered.      Estimated body mass index is 37.02 kg/m as calculated from the following:   Height as of this encounter: 5\' 5"  (1.651 m).   Weight as of this encounter: 100.9 kg.   DVT prophylaxis: Eliquis Code Status: DNR Family Communication: Care discussed with Daughter  Disposition Plan:  Status is: Inpatient Remains inpatient appropriate because: Management resp failure    Consultants:  Cardiology   Procedures:    Antimicrobials:    Subjective: He has been coughing more this morning, coughing out phlegm. Plan to give him some dextromethorphan.   Objective: Vitals:   07/22/22 0015 07/22/22 0300 07/22/22 0500 07/22/22 0724  BP: (!) 103/59 (!) 121/56  (!) 114/56  Pulse: (!) 50 (!) 58  (!) 58  Resp: 14 14  13   Temp: 98.5 F (36.9 C) 97.9 F (36.6 C)  97.6 F (36.4 C)  TempSrc: Oral Oral  Oral  SpO2: 97% 99%  98%  Weight:   100.9 kg   Height:        Intake/Output Summary (Last 24 hours) at 07/22/2022 [id number]  Last data filed at 07/22/2022 0648 Gross per 24 hour  Intake 810.36 ml  Output 1900 ml  Net -1089.64 ml    Filed Weights   07/19/22 2144 07/22/22 0500  Weight: 103 kg 100.9 kg    Examination:  General exam: NAD Respiratory system: BL wheezing  , ronchus.  Cardiovascular system: S 1, S 2 RRR Gastrointestinal system: BS  present, soft nt Central nervous system: Alert, follows command Extremities: Trace edema   Data Reviewed: I have personally reviewed following labs and imaging studies  CBC: Recent Labs  Lab 07/19/22 2129 07/19/22 2327 07/20/22 0902 07/21/22 0350  WBC 9.7  --  8.7 7.9  NEUTROABS 7.0  --   --   --   HGB 13.8 12.6* 12.2* 11.7*  HCT 40.5 37.0* 35.0* 34.6*  MCV 105.7*  --  104.2* 107.1*  PLT 191  --  153 143*    Basic Metabolic Panel: Recent Labs  Lab 07/19/22 2129 07/19/22 2327 07/20/22 0902 07/21/22 0350  NA 138 135 140 143  K 3.9 4.0 4.1 4.5  CL 99  --  97* 98  CO2 25  --  29 31  GLUCOSE 124*  --  139* 133*  BUN 35*  --  35* 38*  CREATININE 1.52*  --  1.38* 1.28*  CALCIUM 8.8*  --  8.6* 9.1    GFR: Estimated Creatinine Clearance: 44.5 mL/min (A) (by C-G formula based on SCr of 1.28 mg/dL (H)). Liver Function Tests: Recent Labs  Lab 07/19/22 2129  AST 32  ALT 23  ALKPHOS 79  BILITOT 0.6  PROT 7.2  ALBUMIN 3.5    No results for input(s): "LIPASE", "AMYLASE" in the last 168 hours. No results for input(s): "AMMONIA" in the last 168 hours. Coagulation Profile: No results for input(s): "INR", "PROTIME" in the last 168 hours. Cardiac Enzymes: No results for input(s): "CKTOTAL", "CKMB", "CKMBINDEX", "TROPONINI" in the last 168 hours. BNP (last 3 results) No results for input(s): "PROBNP" in the last 8760 hours. HbA1C: Recent Labs    07/20/22 0214  HGBA1C 5.9*    CBG: Recent Labs  Lab 07/20/22 1954 07/21/22 1401 07/21/22 1730 07/21/22 2128 07/22/22 0628  GLUCAP 106* 122* 116* 97 83    Lipid Profile: No results for input(s): "CHOL", "HDL", "LDLCALC", "TRIG", "CHOLHDL", "LDLDIRECT" in the last 72 hours. Thyroid Function Tests: No results for input(s): "TSH", "T4TOTAL", "FREET4", "T3FREE", "THYROIDAB" in the last 72 hours. Anemia Panel: No results for input(s): "VITAMINB12", "FOLATE", "FERRITIN", "TIBC", "IRON", "RETICCTPCT" in the last 72  hours. Sepsis Labs: Recent Labs  Lab 07/20/22 0046 07/20/22 0214 07/21/22 0350  PROCALCITON  --  <0.10 <0.10  LATICACIDVEN 1.3  --   --      Recent Results (from the past 240 hour(s))  Resp Panel by RT-PCR (Flu A&B, Covid) Anterior Nasal Swab     Status: None   Collection Time: 07/19/22  9:29 PM   Specimen: Anterior Nasal Swab  Result Value Ref Range Status   SARS Coronavirus 2 by RT PCR NEGATIVE NEGATIVE Final    Comment: (NOTE) SARS-CoV-2 target nucleic acids are NOT DETECTED.  The SARS-CoV-2 RNA is generally detectable in upper respiratory specimens during the acute phase of infection. The lowest concentration of SARS-CoV-2 viral copies this assay can detect is 138 copies/mL. A negative result does not preclude SARS-Cov-2 infection and should not be used as the sole basis for treatment or other patient management decisions. A negative result may occur with  improper specimen collection/handling, submission  of specimen other than nasopharyngeal swab, presence of viral mutation(s) within the areas targeted by this assay, and inadequate number of viral copies(<138 copies/mL). A negative result must be combined with clinical observations, patient history, and epidemiological information. The expected result is Negative.  Fact Sheet for Patients:  BloggerCourse.com  Fact Sheet for Healthcare Providers:  SeriousBroker.it  This test is no t yet approved or cleared by the [place] FDA and  has been authorized for detection and/or diagnosis of SARS-CoV-2 by FDA under an Emergency Use Authorization (EUA). This EUA will remain  in effect (meaning this test can be used) for the duration of the COVID-19 declaration under Section 564(b)(1) of the Act, 21 U.S.C.section 360bbb-3(b)(1), unless the authorization is terminated  or revoked sooner.       Influenza A by PCR NEGATIVE NEGATIVE Final   Influenza B by PCR NEGATIVE  NEGATIVE Final    Comment: (NOTE) The Xpert Xpress SARS-CoV-2/FLU/RSV plus assay is intended as an aid in the diagnosis of influenza from Nasopharyngeal swab specimens and should not be used as a sole basis for treatment. Nasal washings and aspirates are unacceptable for Xpert Xpress SARS-CoV-2/FLU/RSV testing.  Fact Sheet for Patients: BloggerCourse.com  Fact Sheet for Healthcare Providers: SeriousBroker.it  This test is not yet approved or cleared by the [place] FDA and has been authorized for detection and/or diagnosis of SARS-CoV-2 by FDA under an Emergency Use Authorization (EUA). This EUA will remain in effect (meaning this test can be used) for the duration of the COVID-19 declaration under Section 564(b)(1) of the Act, 21 U.S.C. section 360bbb-3(b)(1), unless the authorization is terminated or revoked.  Performed at [facility], [address], [place], [place] [address]   Urine Culture     Status: None   Collection Time: 07/19/22 10:31 PM   Specimen: Urine, Clean Catch  Result Value Ref Range Status   Specimen Description URINE, CLEAN CATCH  Final   Special Requests NONE  Final   Culture   Final    NO GROWTH Performed at [facility], [address] [place], [place] [address]    Report Status 07/21/2022 FINAL  Final  Blood culture (routine x 2)     Status: None (Preliminary result)   Collection Time: 07/19/22 10:36 PM   Specimen: BLOOD  Result Value Ref Range Status   Specimen Description BLOOD LEFT ANTECUBITAL  Final   Special Requests   Final    BOTTLES DRAWN AEROBIC AND ANAEROBIC Blood Culture adequate volume   Culture   Final    NO GROWTH 1 DAY Performed at [facility], [address] [place], [place] [address]    Report Status PENDING  Incomplete  Blood culture (routine x 2)     Status: None (Preliminary result)   Collection Time: 07/19/22 11:18 PM   Specimen: BLOOD LEFT  FOREARM  Result Value Ref Range Status   Specimen Description BLOOD LEFT FOREARM  Final   Special Requests   Final    BOTTLES DRAWN AEROBIC AND ANAEROBIC Blood Culture results may not be optimal due to an excessive volume of blood received in culture bottles   Culture   Final    NO GROWTH 1 DAY Performed at [facility], [address], [place], [place] [address]    Report Status PENDING  Incomplete  MRSA Next Gen by PCR, Nasal     Status: Abnormal   Collection Time: 07/20/22  4:01 AM   Specimen:  Nasal Mucosa; Nasal Swab  Result Value Ref Range Status   MRSA by PCR Next Gen DETECTED (A) NOT DETECTED Final    Comment: CRITICAL RESULT CALLED TO, READ BACK BY AND VERIFIED WITH: BRIANA LEAKE RN ON 11.30.23 AT 0818 BY EM (NOTE) The GeneXpert MRSA Assay (FDA approved for NASAL specimens only), is one component of a comprehensive MRSA colonization surveillance program. It is not intended to diagnose MRSA infection nor to guide or monitor treatment for MRSA infections. Test performance is not FDA approved in patients less than 59 years old. Performed at [facility], [place] [address], [place], [place] [address]          Radiology Studies: DG CHEST PORT 1 VIEW  Result Date: 07/20/2022 CLINICAL DATA:  Respiratory failure EXAM: PORTABLE CHEST 1 VIEW COMPARISON:  Chest radiograph dated 07/19/2022. FINDINGS: The heart is enlarged. Vascular calcifications are seen in the aortic arch. Left basilar atelectasis/airspace disease appears similar to prior exam. A small pleural effusion may contribute. There is mild right basilar atelectasis/airspace disease. There is no right pleural effusion. There is no pneumothorax. Degenerative changes are seen in the spine. IMPRESSION: 1. Left basilar atelectasis/airspace disease appears similar to prior exam. A small pleural effusion may contribute. 2. Mild right basilar atelectasis/airspace disease. Electronically Signed   By: Zerita Boers M.D.    On: 07/20/2022 14:47        Scheduled Meds:  apixaban  5 mg Oral BID   arformoterol  15 mcg Nebulization BID   atorvastatin  40 mg Oral QHS   bimatoprost  1 drop Both Eyes QHS   brimonidine  1 drop Both Eyes BID   budesonide  2 mL Inhalation BID   Chlorhexidine Gluconate Cloth  6 each Topical Q0600   dorzolamide-timolol  1 drop Right Eye BID   furosemide  40 mg Intravenous BID   guaiFENesin  1,200 mg Oral BID   insulin aspart  0-15 Units Subcutaneous TID WC   ipratropium-albuterol  3 mL Inhalation TID   levothyroxine  125 mcg Oral Q0600   methylPREDNISolone (SOLU-MEDROL) injection  40 mg Intravenous BID   mupirocin ointment  1 Application Nasal BID   primidone  125 mg Oral q1800   primidone  250 mg Oral q morning   propranolol  20 mg Oral BID   Continuous Infusions:  azithromycin (ZITHROMAX) 500 mg in sodium chloride 0.9 % 250 mL IVPB 500 mg (07/21/22 0957)   cefTRIAXone (ROCEPHIN)  IV 2 g (07/21/22 1006)   vancomycin Stopped (07/22/22 0245)     LOS: 2 days    Time spent: 35 minutes    Lavern Crimi A Malayia Spizzirri, MD [facility]   If 7PM-7AM, please contact night-coverage [email or web address]  07/22/2022, 7:29 AM
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
## 2022-07-22 NOTE — TOC Initial Note (Signed)
Transition of Care [facility]) - Initial/Assessment Note    Patient Details  Name: Billy Smith MRN: [id number] Date of Birth: 01-05-1933  Transition [facility] Care [facility] CM/SW Contact:    Patrice Paradise, LCSW Phone Number: 07/22/2022, 10:32 AM  Clinical Narrative:                  CSW spoke with pt's daughter [name] due to pt's orientation. [name] confirmed that pt will return to [facility] as LTC. She explained that pt transferred to LTC about 3 weeks and the plan is to return at DC. Per previous note facility confirmed that pt can return on DC.  TOC team will continue to assist with discharge planning needs.   Expected Discharge Plan: Long Term Nursing Home Barriers to Discharge: Continued Medical Work up   Patient Goals and CMS Choice        Expected Discharge Plan and Services Expected Discharge Plan: Long Term Nursing Home       Living arrangements for the past 2 months: Skilled Nursing Facility                                      Prior Living Arrangements/Services Living arrangements for the past 2 months: Skilled Nursing Facility Lives with:: Facility Resident            Care giver support system in place?: Yes (comment)      Activities of Daily Living Home Assistive Devices/Equipment: Wheelchair ADL Screening (condition at time of admission) Patient's cognitive ability adequate to safely complete daily activities?: No Is the patient deaf or have difficulty hearing?: Yes (trouble hearing) Does the patient have difficulty seeing, even when wearing glasses/contacts?: Yes (7% vision in the right eye, glaucoma) Does the patient have difficulty concentrating, remembering, or making decisions?: Yes Patient able to express need for assistance with ADLs?: Yes Does the patient have difficulty dressing or bathing?: Yes Independently performs ADLs?: No Communication: Independent Dressing (OT): Needs assistance Is this a change from baseline?: Pre-admission  baseline Grooming: Needs assistance Is this a change from baseline?: Pre-admission baseline Feeding: Needs assistance Is this a change from baseline?: Pre-admission baseline Bathing: Needs assistance Is this a change from baseline?: Pre-admission baseline Toileting: Dependent Is this a change from baseline?: Pre-admission baseline In/Out Bed: Needs assistance Is this a change from baseline?: Pre-admission baseline Walks in Home: Needs assistance Is this a change from baseline?: Pre-admission baseline Does the patient have difficulty walking or climbing stairs?: Yes Weakness of Legs: Both Weakness of Arms/Hands: Both  Permission Sought/Granted      Share Information with NAME: [name]  Permission granted to share info w AGENCY: [facility]  Permission granted to share info w Relationship: Duaghter  Permission granted to share info w Contact Information: [phone]  Emotional Assessment Appearance:: Other (Comment Required Attitude/Demeanor/Rapport: Unable to Assess Affect (typically observed): Unable to Assess Orientation: : Oriented to Self      Admission diagnosis:  COPD exacerbation (HCC) [J44.1] Acute respiratory failure with hypoxia (HCC) [J96.01] HCAP (healthcare-associated pneumonia) [J18.9] Community acquired pneumonia, unspecified laterality [J18.9] Patient Active Problem List   Diagnosis Date Noted   HCAP (healthcare-associated pneumonia) 07/20/2022   Acute respiratory failure with hypoxia (HCC) 07/20/2022   Chronic diastolic CHF (congestive heart failure) (HCC) 07/20/2022   Aspiration pneumonia (HCC) 05/09/2022   Pressure injury of skin 05/02/2022   Class 2 obesity 05/02/2022   Acute on chronic diastolic  CHF (congestive heart failure) (HCC) 05/01/2022   Acute hypercapnic respiratory failure (HCC) 05/01/2022   Acute metabolic encephalopathy 05/01/2022   Atrial flutter (HCC) 05/01/2022   HYPERLIPIDEMIA 05/06/2009   SLEEP APNEA, OBSTRUCTIVE 05/06/2009    Coronary artery disease 05/06/2009   VENOUS INSUFFICIENCY 05/06/2009   COPD (chronic obstructive pulmonary disease) (HCC) 05/06/2009   NEPHROLITHIASIS, HX OF 05/06/2009   CORONARY ARTERY BYPASS GRAFT, HX OF 05/06/2009   PCP:  Paulina Fusi, MD Pharmacy:   [facility] [phone] Rosalita Levan, [place] - [address] AT [facility] [address] [place] [address] Phone: [phone] Fax: [phone]  [facility] [address] [place] - [address] [place] [address] Phone: [phone] Fax: [phone]  [facility] [place], [place] [address] [place] [address] Phone: [phone] Fax: [phone]     Social Determinants of Health (SDOH) Interventions Food Insecurity Interventions: Intervention Not Indicated Housing Interventions: Intervention Not Indicated Transportation Interventions: Intervention Not Indicated Utilities Interventions: Intervention Not Indicated  Readmission Risk Interventions   No data to display

## 2022-07-23 DIAGNOSIS — Z515 Encounter for palliative care: Secondary | ICD-10-CM | POA: Diagnosis not present

## 2022-07-23 DIAGNOSIS — J189 Pneumonia, unspecified organism: Secondary | ICD-10-CM | POA: Diagnosis not present

## 2022-07-23 LAB — GLUCOSE, CAPILLARY
Glucose-Capillary: 97 mg/dL (ref 70–99)
Glucose-Capillary: 151 mg/dL — ABNORMAL HIGH (ref 70–99)
Glucose-Capillary: 99 mg/dL (ref 70–99)

## 2022-07-23 LAB — BASIC METABOLIC PANEL
Anion gap: 8 (ref 5–15)
BUN: 39 mg/dL — ABNORMAL HIGH (ref 8–23)
CO2: 32 mmol/L (ref 22–32)
Calcium: 8.9 mg/dL (ref 8.9–10.3)
Chloride: 102 mmol/L (ref 98–111)
Creatinine, Ser: 1.25 mg/dL — ABNORMAL HIGH (ref 0.61–1.24)
GFR, Estimated: 56 mL/min — ABNORMAL LOW (ref >60)
Glucose, Bld: 103 mg/dL — ABNORMAL HIGH (ref 70–99)
Potassium: 4.2 mmol/L (ref 3.5–5.1)
Sodium: 142 mmol/L (ref 135–145)

## 2022-07-23 LAB — CBC
HCT: 32.7 % — ABNORMAL LOW (ref 39.0–52.0)
Hemoglobin: 11.1 g/dL — ABNORMAL LOW (ref 13.0–17.0)
MCH: 35.6 pg — ABNORMAL HIGH (ref 26.0–34.0)
MCHC: 33.9 g/dL (ref 30.0–36.0)
MCV: 104.8 fL — ABNORMAL HIGH (ref 80.0–100.0)
Platelets: 172 10*3/uL (ref 150–400)
RBC: 3.12 MIL/uL — ABNORMAL LOW (ref 4.22–5.81)
RDW: 13.8 % (ref 11.5–15.5)
WBC: 9.1 10*3/uL (ref 4.0–10.5)
nRBC: 0 % (ref 0.0–0.2)

## 2022-07-23 MED ORDER — FUROSEMIDE 10 MG/ML IJ SOLN
80.0000 mg | Freq: Two times a day (BID) | INTRAMUSCULAR | Status: DC
  Administered 2022-07-23 – 2022-07-24 (×3): 80 mg via INTRAVENOUS
  Filled 2022-07-23 (×3): qty 8

## 2022-07-23 NOTE — Plan of Care (Signed)
  Problem: Coping: Goal: Ability to adjust to condition or change in health will improve Outcome: Progressing   Problem: Fluid Volume: Goal: Ability to maintain a balanced intake and output will improve Outcome: Progressing   Problem: Health Behavior/Discharge Planning: Goal: Ability to manage health-related needs will improve Outcome: Progressing   Problem: Metabolic: Goal: Ability to maintain appropriate glucose levels will improve Outcome: Progressing   Problem: Nutritional: Goal: Maintenance of adequate nutrition will improve Outcome: Progressing   Problem: Skin Integrity: Goal: Risk for impaired skin integrity will decrease Outcome: Progressing   Problem: Tissue Perfusion: Goal: Adequacy of tissue perfusion will improve Outcome: Progressing   

## 2022-07-23 NOTE — Progress Notes (Signed)
Palliative Medicine Inpatient Follow Up Note HPI: 88 year old with past medical history significant for chronic diastolic heart failure, history of COPD not on oxygen at home, CAD status post CABG, A-flutter on Eliquis prior hospitalization September for acute respiratory failure with hypercapnia in the setting of aspiration pneumonia.    Palliative care is familiar with Billy Smith from his admission in September, we have been asked to re-engage for additional goals of care conversations.    Today's Discussion 07/23/2022  *Please note that this is a verbal dictation therefore any spelling or grammatical errors are due to the "[place] One" system interpretation.  Chart reviewed inclusive of vital signs, progress notes, laboratory results, and diagnostic images.   I check in with patients RN, Joellen Jersey who shares that patient overall seems to be improving and his getting more ornery.   I met with Billy Smith and his daughter, Billy Smith at bedside this morning.   Siddharth expresses pain at IV site which is addressed by nursing staff.  Per [name] her father has been more interactive and is doing things to indicate he is in an improved state physically and mentally. This is indicated through his vocalization of planning Christmas gifts for relatives and his increased movement.   Discussed with [name] the plan to continue care with the hope that [name] will leave mid-week back to [facility]. [name] discussed that [name] has additional fluid on his that Cardiology has indicated needs to be diuresed prior to stability for discharge.   [name] is very understanding towards what the future may look like and expresses that she knows "that tough decision" will come in time and when it does she will know what to do. She expresses that Billy Smith still has quality in his life and she would like to maintain this as long as his body will allow it.   We did further discuss MBS which [name] is still considering. I shared that if anything it gives her  information for the future. I emphasized that it would not mean he would need to stop eating and drinking but would rather allow her to know the risk of this. [name] shares that a big part of her fathers quality in life is consumption which is why she is apprehensive. Abated concerns through listening.   Questions and concerns addressed/Palliative Support Provided.   Objective Assessment: Vital Signs Vitals:   07/23/22 0709 07/23/22 0847  BP: 130/63   Pulse: 62 61  Resp: 13   Temp: 98.2 F (36.8 C)   SpO2: 97%     Intake/Output Summary (Last 24 hours) at 07/23/2022 6834 Last data filed at 07/23/2022 0630 Gross per 24 hour  Intake 427.96 ml  Output 1825 ml  Net -1397.04 ml   Last Weight  Most recent update: 07/23/2022  6:32 AM    Weight  102.5 kg (225 lb 15.5 oz)            Gen: Elderly chronically ill appearing Caucasian male in mild distress HEENT: Drymucous membranes CV: Regular rate and irregular rhythm PULM: On 2LPM Williams, (+) wheeze, (+) crackles ABD: soft/nontender EXT: (+) Generalized edema Neuro: Alert and oriented x2-3 -very hard of hearing  SUMMARY OF RECOMMENDATIONS   DNAR/DNI    An open and honest conversation regarding patient's clinical situation and the concern for recurrent pneumonia --> plan for MBS to identify whether or not there is an aspiration component - [name] had been resistant to this but is thinking more about the information it will provide. Speech pathologist/specialist, Estill Bamberg  plans to follow up on Monday   Patient's daughter would like to continue with the current plan --> the hope for patient to transition back to [facility] midweek   When insurance shifts in January patient's daughter is open to the idea of outpatient palliative support   Ongoing palliative support  Billing based on MDM: High _____________________________________________________________________________________ [facility] Team Team Cell  Phone: [phone] Please utilize secure chat with additional questions, if there is no response within 30 minutes please call the above phone number  Palliative Medicine Team providers are available by phone from 7am to 7pm daily and can be reached through the team cell phone.  Should this patient require assistance outside of these hours, please call the patient's attending physician.

## 2022-07-23 NOTE — Progress Notes (Signed)
Pharmacy Antibiotic Note  YANIEL LIMBAUGH is a 88 y.o. male admitted on 07/19/2022 with pneumonia.   Day #4 of IV antibiotics for pneumonia - > Zithromax, Rocephin, Vancomycin  Cultures negative, MRSA PCR +   Plan: No change in antibiotic doses 5 day course set.  Height: 5\' 5"  (165.1 cm) Weight: 102.5 kg (225 lb 15.5 oz) IBW/kg (Calculated) : 61.5  Temp (24hrs), Avg:97.9 F (36.6 C), Min:97.5 F (36.4 C), Max:98.2 F (36.8 C)  Recent Labs  Lab 07/19/22 2129 07/20/22 0046 07/20/22 0902 07/21/22 0350 07/22/22 0753 07/23/22 0012  WBC 9.7  --  8.7 7.9 8.9 9.1  CREATININE 1.52*  --  1.38* 1.28* 1.25* 1.25*  LATICACIDVEN  --  1.3  --   --   --   --      Estimated Creatinine Clearance: 45.9 mL/min (A) (by C-G formula based on SCr of 1.25 mg/dL (H)).    Allergies  Allergen Reactions   Xalatan [Latanoprost]     Xalatan (latanoprost) does not control ocular pressure.  Must use Lumigan (bimatoprost).    Thank you 14/03/23, PharmD

## 2022-07-23 NOTE — TOC Progression Note (Addendum)
Transition of Care [facility]) - Progression Note    Patient Details  Name: COLUMBUS ICE MRN: [id number] Date of Birth: 1933-08-02  Transition of Care [facility] CM/SW Contact  Delilah Shan, LCSWA Phone Number: 07/23/2022, 2:32 PM  Clinical Narrative:     Patient is from [facility].Plan for patient to return to [facility] close to patient being medically ready for dc.CSW following to start insurance authorization close to patient being medically ready for dc.CSW will continue to follow and assist with patients dc planning needs.  Expected Discharge Plan: Long Term Nursing Home Barriers to Discharge: Continued Medical Work up  Expected Discharge Plan and Services Expected Discharge Plan: Long Term Nursing Home       Living arrangements for the past 2 months: Skilled Nursing Facility                                       Social Determinants of Health (SDOH) Interventions Food Insecurity Interventions: Intervention Not Indicated Housing Interventions: Intervention Not Indicated Transportation Interventions: Intervention Not Indicated Utilities Interventions: Intervention Not Indicated  Readmission Risk Interventions     No data to display

## 2022-07-23 NOTE — Progress Notes (Signed)
Rounding Note    Patient Name: Billy Smith Date of Encounter: 07/23/2022  [place] Cardiologist: Lauree Chandler, MD   Subjective   No chest pain. Daughter notes that he feels better.   Inpatient Medications    Scheduled Meds:  apixaban  5 mg Oral BID   arformoterol  15 mcg Nebulization BID   atorvastatin  40 mg Oral QHS   bimatoprost  1 drop Both Eyes QHS   brimonidine  1 drop Both Eyes BID   budesonide  2 mL Inhalation BID   Chlorhexidine Gluconate Cloth  6 each Topical Q0600   dorzolamide-timolol  1 drop Right Eye BID   furosemide  80 mg Intravenous BID   guaiFENesin  1,200 mg Oral BID   insulin aspart  0-15 Units Subcutaneous TID WC   ipratropium-albuterol  3 mL Inhalation TID   levothyroxine  125 mcg Oral Q0600   methylPREDNISolone (SOLU-MEDROL) injection  40 mg Intravenous BID   mupirocin ointment  1 Application Nasal BID   primidone  125 mg Oral q1800   primidone  250 mg Oral q morning   propranolol  20 mg Oral BID   senna-docusate  1 tablet Oral Daily   Continuous Infusions:  azithromycin (ZITHROMAX) 500 mg in sodium chloride 0.9 % 250 mL IVPB 500 mg (07/23/22 0931)   cefTRIAXone (ROCEPHIN)  IV 2 g (07/23/22 0852)   vancomycin Stopped (07/23/22 0042)   PRN Meds: acetaminophen **OR** acetaminophen, albuterol, dextromethorphan, food thickener, ondansetron **OR** ondansetron (ZOFRAN) IV   Vital Signs    Vitals:   07/23/22 0333 07/23/22 0600 07/23/22 0709 07/23/22 0847  BP: (!) 100/59 (!) 100/55 130/63   Pulse: 60 62 62 61  Resp: 15 13 13    Temp: 98 F (36.7 C)  98.2 F (36.8 C)   TempSrc: Oral  Oral   SpO2: 98% 100% 97%   Weight:  102.5 kg    Height:        Intake/Output Summary (Last 24 hours) at 07/23/2022 0948 Last data filed at 07/23/2022 0630 Gross per 24 hour  Intake 427.96 ml  Output 1825 ml  Net -1397.04 ml      07/23/2022    6:00 AM 07/22/2022    5:00 AM 07/19/2022    9:44 PM  Last 3 Weights  Weight (lbs) 225 lb  15.5 oz 222 lb 7.1 oz 227 lb 1.2 oz  Weight (kg) 102.5 kg 100.9 kg 103 kg      Telemetry    nsr - Personally Reviewed  ECG    none - Personally Reviewed  Physical Exam   GEN: No acute distress.   Neck: No JVD Cardiac: RRR, no murmurs, rubs, or gallops.  Respiratory: Clear to auscultation bilaterally. GI: Soft, nontender, non-distended  MS: 1+ edema; No deformity. Neuro:  Nonfocal  Psych: Normal affect   Labs    High Sensitivity Troponin:   Recent Labs  Lab 07/19/22 2129 07/19/22 2317  TROPONINIHS 24* 24*     Chemistry Recent Labs  Lab 07/19/22 2129 07/19/22 2327 07/21/22 0350 07/22/22 0753 07/23/22 0012  NA 138   < > 143 143 142  K 3.9   < > 4.5 4.3 4.2  CL 99   < > 98 104 102  CO2 25   < > 31 28 32  GLUCOSE 124*   < > 133* 104* 103*  BUN 35*   < > 38* 39* 39*  CREATININE 1.52*   < > 1.28* 1.25* 1.25*  CALCIUM 8.8*   < > 9.1 9.1 8.9  PROT 7.2  --   --   --   --   ALBUMIN 3.5  --   --   --   --   AST 32  --   --   --   --   ALT 23  --   --   --   --   ALKPHOS 79  --   --   --   --   BILITOT 0.6  --   --   --   --   GFRNONAA 44*   < > 54* 56* 56*  ANIONGAP 14   < > 14 11 8    < > = values in this interval not displayed.    Lipids No results for input(s): "CHOL", "TRIG", "HDL", "LABVLDL", "LDLCALC", "CHOLHDL" in the last 168 hours.  Hematology Recent Labs  Lab 07/21/22 0350 07/22/22 0753 07/23/22 0012  WBC 7.9 8.9 9.1  RBC 3.23* 3.49* 3.12*  HGB 11.7* 12.4* 11.1*  HCT 34.6* 37.6* 32.7*  MCV 107.1* 107.7* 104.8*  MCH 36.2* 35.5* 35.6*  MCHC 33.8 33.0 33.9  RDW 13.9 13.7 13.8  PLT 143* 186 172   Thyroid No results for input(s): "TSH", "FREET4" in the last 168 hours.  BNP Recent Labs  Lab 07/19/22 2129 07/20/22 0902  BNP 84.6 79.7    DDimer No results for input(s): "DDIMER" in the last 168 hours.   Radiology    No results found.  Cardiac Studies   none  Patient Profile     88 y.o. male admitted with sob, probable pneumonia, as  well as volume overload  Assessment & Plan    Volume overload - his weight is actually up a Kg since yesterday. I have increased the IV diuretic. Daughter notes that oral lasix did not work as an outpatient and patient required torsemide. Probable pneumonia - continue anti-biotics. Atrial flutter - he is maintaining NSR.      For questions or updates, please contact [facility] Please consult [email or web address] for contact info under        Signed, 97, MD  07/23/2022, 9:48 AM

## 2022-07-23 NOTE — Progress Notes (Signed)
Mobility Specialist: Progress Note   07/23/22 1001  Mobility  Activity Transferred from bed to chair  Level of Assistance Minimal assist, patient does 75% or more  Assistive Device Front wheel walker  Distance Ambulated (ft) 2 ft  Activity Response Tolerated well  Mobility Referral Yes  $Mobility charge 1 Mobility   Pt received sitting EOB and assisted to the chair per request. MinA to stand. Cues for RW management and upright posture. Pt is in the chair with RN present in the room.   Nevyn Bossman Mobility Specialist Please contact via SecureChat or Rehab office at [phone]

## 2022-07-23 NOTE — Progress Notes (Signed)
PROGRESS NOTE    Billy Smith  [id number] DOB: 12-08-1932 DOA: 07/19/2022 PCP: Nicoletta Dress, MD   Brief Narrative: 88 year old with past medical history significant for chronic diastolic heart failure, history of COPD not on oxygen at home, CAD status post CABG, A-flutter on Eliquis prior hospitalization September for acute respiratory failure with hypercapnia in the setting of aspiration pneumonia.  Prior to that hospitalization he was hospitalized at [facility] for pneumonia as well.  He presented with worsening cough over the last 2 days, worsening hypoxemia.  Evaluation in the ED chest x-ray raise concern for pneumonia.  Patient admitted for pneumonia.   Assessment & Plan:   Principal Problem:   HCAP (healthcare-associated pneumonia) Active Problems:   Acute metabolic encephalopathy   Atrial flutter (HCC)   COPD (chronic obstructive pulmonary disease) (HCC)   Coronary artery disease   Acute respiratory failure with hypoxia (HCC)   Chronic diastolic CHF (congestive heart failure) (HCC)   1-Acute  Hypoxic Respiratory Failure associated with pneumonia and acute COPD exacerbation: -Concern for aspiration pneumonia, a speech has been consulted. -MRSA PCR psotive.  -Continue with  Vancomycin,  Azithro. Change Cefepime to Ceftriaxone to prevent encephalopathy/. Day 4/7 -Continue with Solumedrol IV -Continue with Nebulizer treatment.  -Continue with guaifenesin,  brovana.  -Evaluated by speech 12/01, concern for aspiration. Keep NPO for now.  -Speech evaluation 12/02: plan to proceed regular, Nectar Thick liquid. Daughter prefer to defer MBS/  Flutter valve.  Continue with current management.   Acute metabolic encephalopathy: -In setting of infection, acute illness.  -Support care.  -ABG PH compensated.  Taper down oxygen, Oxygen sat goal 90--% Change cefepime to ceftriaxone to avoid encephalopathy.  He is getting more alert, some agitation, and sign that he might be  getting to his baseline.   AKI: Creatinine 1.0 Presented  with a creatinine 1.5 Continue to monitor on lasix.   History of a flutter/A-fib: -Continue with Eliquis  CAD status post CABG: -Continue with Lipitor.   Acute on Chronic diastolic heart failure: He recently had increase torsemide dose to 60 mg daily Cardiology have been consulted.  IV lasix change to 80 mg IV BID. 12/03 Daily weight. 227---222--225 Negative 1 l Weight on discharge last hospitalization 224.  Goals of Care:  DNR/DNI appropriately.  Palliative care consulted.  Daughter wants to try to get him to his prior level of health.   Essential tremor; Continue with Propanolol and and Primidone.  Hypothyroidism; Continue with Synthroid.   Sleep Apnea; CPAP ordered.      Estimated body mass index is 37.6 kg/m as calculated from the following:   Height as of this encounter: 5\' 5"  (1.651 m).   Weight as of this encounter: 102.5 kg.   DVT prophylaxis: Eliquis Code Status: DNR Family Communication: Care discussed with Daughter  Disposition Plan:  Status is: Inpatient Remains inpatient appropriate because: Management resp failure    Consultants:  Cardiology   Procedures:    Antimicrobials:    Subjective: He was agitated earlier. He did well last night with supper, no worsening coughing.  He is showing sign he is getting to his prior baseline     Objective: Vitals:   07/23/22 0600 07/23/22 0709 07/23/22 0847 07/23/22 1100  BP: (!) 100/55 130/63  119/73  Pulse: 62 62 61 61  Resp: 13 13  16   Temp:  98.2 F (36.8 C)    TempSrc:  Oral    SpO2: 100% 97%  96%  Weight: 102.5 kg  Height:        Intake/Output Summary (Last 24 hours) at 07/23/2022 1402 Last data filed at 07/23/2022 0630 Gross per 24 hour  Intake 427.96 ml  Output 1225 ml  Net -797.04 ml    Filed Weights   07/19/22 2144 07/22/22 0500 07/23/22 0600  Weight: 103 kg 100.9 kg 102.5 kg    Examination:  General exam:  NAD Respiratory system: BL wheezing.  Cardiovascular system: S 1, S 2 RRR Gastrointestinal system: BS present, soft, nt Central nervous system: alert Extremities: trace edema   Data Reviewed: I have personally reviewed following labs and imaging studies  CBC: Recent Labs  Lab 07/19/22 2129 07/19/22 2327 07/20/22 0902 07/21/22 0350 07/22/22 0753 07/23/22 0012  WBC 9.7  --  8.7 7.9 8.9 9.1  NEUTROABS 7.0  --   --   --   --   --   HGB 13.8 12.6* 12.2* 11.7* 12.4* 11.1*  HCT 40.5 37.0* 35.0* 34.6* 37.6* 32.7*  MCV 105.7*  --  104.2* 107.1* 107.7* 104.8*  PLT 191  --  153 143* 186 172    Basic Metabolic Panel: Recent Labs  Lab 07/19/22 2129 07/19/22 2327 07/20/22 0902 07/21/22 0350 07/22/22 0753 07/23/22 0012  NA 138 135 140 143 143 142  K 3.9 4.0 4.1 4.5 4.3 4.2  CL 99  --  97* 98 104 102  CO2 25  --  29 31 28  32  GLUCOSE 124*  --  139* 133* 104* 103*  BUN 35*  --  35* 38* 39* 39*  CREATININE 1.52*  --  1.38* 1.28* 1.25* 1.25*  CALCIUM 8.8*  --  8.6* 9.1 9.1 8.9    GFR: Estimated Creatinine Clearance: 45.9 mL/min (A) (by C-G formula based on SCr of 1.25 mg/dL (H)). Liver Function Tests: Recent Labs  Lab 07/19/22 2129  AST 32  ALT 23  ALKPHOS 79  BILITOT 0.6  PROT 7.2  ALBUMIN 3.5    No results for input(s): "LIPASE", "AMYLASE" in the last 168 hours. No results for input(s): "AMMONIA" in the last 168 hours. Coagulation Profile: No results for input(s): "INR", "PROTIME" in the last 168 hours. Cardiac Enzymes: No results for input(s): "CKTOTAL", "CKMB", "CKMBINDEX", "TROPONINI" in the last 168 hours. BNP (last 3 results) No results for input(s): "PROBNP" in the last 8760 hours. HbA1C: No results for input(s): "HGBA1C" in the last 72 hours.  CBG: Recent Labs  Lab 07/22/22 1141 07/22/22 1619 07/22/22 2151 07/23/22 0623 07/23/22 1129  GLUCAP 95 151* 81 97 151*    Lipid Profile: No results for input(s): "CHOL", "HDL", "LDLCALC", "TRIG",  "CHOLHDL", "LDLDIRECT" in the last 72 hours. Thyroid Function Tests: No results for input(s): "TSH", "T4TOTAL", "FREET4", "T3FREE", "THYROIDAB" in the last 72 hours. Anemia Panel: No results for input(s): "VITAMINB12", "FOLATE", "FERRITIN", "TIBC", "IRON", "RETICCTPCT" in the last 72 hours. Sepsis Labs: Recent Labs  Lab 07/20/22 0046 07/20/22 0214 07/21/22 0350  PROCALCITON  --  <0.10 <0.10  LATICACIDVEN 1.3  --   --      Recent Results (from the past 240 hour(s))  Resp Panel by RT-PCR (Flu A&B, Covid) Anterior Nasal Swab     Status: None   Collection Time: 07/19/22  9:29 PM   Specimen: Anterior Nasal Swab  Result Value Ref Range Status   SARS Coronavirus 2 by RT PCR NEGATIVE NEGATIVE Final    Comment: (NOTE) SARS-CoV-2 target nucleic acids are NOT DETECTED.  The SARS-CoV-2 RNA is generally detectable in upper respiratory specimens during the  acute phase of infection. The lowest concentration of SARS-CoV-2 viral copies this assay can detect is 138 copies/mL. A negative result does not preclude SARS-Cov-2 infection and should not be used as the sole basis for treatment or other patient management decisions. A negative result may occur with  improper specimen collection/handling, submission of specimen other than nasopharyngeal swab, presence of viral mutation(s) within the areas targeted by this assay, and inadequate number of viral copies(<138 copies/mL). A negative result must be combined with clinical observations, patient history, and epidemiological information. The expected result is Negative.  Fact Sheet for Patients:  BloggerCourse.com  Fact Sheet for Healthcare Providers:  SeriousBroker.it  This test is no t yet approved or cleared by the [place] FDA and  has been authorized for detection and/or diagnosis of SARS-CoV-2 by FDA under an Emergency Use Authorization (EUA). This EUA will remain  in effect  (meaning this test can be used) for the duration of the COVID-19 declaration under Section 564(b)(1) of the Act, 21 U.S.C.section 360bbb-3(b)(1), unless the authorization is terminated  or revoked sooner.       Influenza A by PCR NEGATIVE NEGATIVE Final   Influenza B by PCR NEGATIVE NEGATIVE Final    Comment: (NOTE) The Xpert Xpress SARS-CoV-2/FLU/RSV plus assay is intended as an aid in the diagnosis of influenza from Nasopharyngeal swab specimens and should not be used as a sole basis for treatment. Nasal washings and aspirates are unacceptable for Xpert Xpress SARS-CoV-2/FLU/RSV testing.  Fact Sheet for Patients: BloggerCourse.com  Fact Sheet for Healthcare Providers: SeriousBroker.it  This test is not yet approved or cleared by the [place] FDA and has been authorized for detection and/or diagnosis of SARS-CoV-2 by FDA under an Emergency Use Authorization (EUA). This EUA will remain in effect (meaning this test can be used) for the duration of the COVID-19 declaration under Section 564(b)(1) of the Act, 21 U.S.C. section 360bbb-3(b)(1), unless the authorization is terminated or revoked.  Performed at [facility], [address], [place], [place] [address]   Urine Culture     Status: None   Collection Time: 07/19/22 10:31 PM   Specimen: Urine, Clean Catch  Result Value Ref Range Status   Specimen Description URINE, CLEAN CATCH  Final   Special Requests NONE  Final   Culture   Final    NO GROWTH Performed at [facility], [address], [place], [place] [address]    Report Status 07/21/2022 FINAL  Final  Blood culture (routine x 2)     Status: None (Preliminary result)   Collection Time: 07/19/22 10:36 PM   Specimen: BLOOD  Result Value Ref Range Status   Specimen Description BLOOD LEFT ANTECUBITAL  Final   Special Requests   Final    BOTTLES DRAWN AEROBIC AND ANAEROBIC Blood Culture adequate volume    Culture   Final    NO GROWTH 3 DAYS Performed at [facility], [address] [place], [place] [address]    Report Status PENDING  Incomplete  Blood culture (routine x 2)     Status: None (Preliminary result)   Collection Time: 07/19/22 11:18 PM   Specimen: BLOOD LEFT FOREARM  Result Value Ref Range Status   Specimen Description BLOOD LEFT FOREARM  Final   Special Requests   Final    BOTTLES DRAWN AEROBIC AND ANAEROBIC Blood Culture results may not be optimal due to an excessive volume of blood received in culture bottles   Culture  Final    NO GROWTH 3 DAYS Performed at [facility], [place] [address], [place], [place] [address]    Report Status PENDING  Incomplete  MRSA Next Gen by PCR, Nasal     Status: Abnormal   Collection Time: 07/20/22  4:01 AM   Specimen: Nasal Mucosa; Nasal Swab  Result Value Ref Range Status   MRSA by PCR Next Gen DETECTED (A) NOT DETECTED Final    Comment: CRITICAL RESULT CALLED TO, READ BACK BY AND VERIFIED WITH: BRIANA LEAKE RN ON 11.30.23 AT 0818 BY EM (NOTE) The GeneXpert MRSA Assay (FDA approved for NASAL specimens only), is one component of a comprehensive MRSA colonization surveillance program. It is not intended to diagnose MRSA infection nor to guide or monitor treatment for MRSA infections. Test performance is not FDA approved in patients less than 50 years old. Performed at [facility], [place] [address], [place], [place] [address]          Radiology Studies: No results found.      Scheduled Meds:  apixaban  5 mg Oral BID   arformoterol  15 mcg Nebulization BID   atorvastatin  40 mg Oral QHS   bimatoprost  1 drop Both Eyes QHS   brimonidine  1 drop Both Eyes BID   budesonide  2 mL Inhalation BID   Chlorhexidine Gluconate Cloth  6 each Topical Q0600   dorzolamide-timolol  1 drop Right Eye BID   furosemide  80 mg Intravenous BID   guaiFENesin  1,200 mg Oral BID   insulin aspart  0-15 Units Subcutaneous TID  WC   ipratropium-albuterol  3 mL Inhalation TID   levothyroxine  125 mcg Oral Q0600   methylPREDNISolone (SOLU-MEDROL) injection  40 mg Intravenous BID   mupirocin ointment  1 Application Nasal BID   primidone  125 mg Oral q1800   primidone  250 mg Oral q morning   propranolol  20 mg Oral BID   senna-docusate  1 tablet Oral Daily   Continuous Infusions:  azithromycin (ZITHROMAX) 500 mg in sodium chloride 0.9 % 250 mL IVPB 500 mg (07/23/22 0931)   cefTRIAXone (ROCEPHIN)  IV 2 g (07/23/22 0852)   vancomycin Stopped (07/23/22 0042)     LOS: 3 days    Time spent: 35 minutes    Taber Sweetser A Queen Abbett, MD [facility]   If 7PM-7AM, please contact night-coverage [email or web address]  07/23/2022, 2:02 PM

## 2022-07-24 ENCOUNTER — Inpatient Hospital Stay (HOSPITAL_COMMUNITY): Payer: PPO

## 2022-07-24 DIAGNOSIS — I5032 Chronic diastolic (congestive) heart failure: Secondary | ICD-10-CM

## 2022-07-24 DIAGNOSIS — J189 Pneumonia, unspecified organism: Secondary | ICD-10-CM | POA: Diagnosis not present

## 2022-07-24 LAB — BASIC METABOLIC PANEL
Anion gap: 9 (ref 5–15)
BUN: 33 mg/dL — ABNORMAL HIGH (ref 8–23)
CO2: 26 mmol/L (ref 22–32)
Calcium: 8.5 mg/dL — ABNORMAL LOW (ref 8.9–10.3)
Chloride: 100 mmol/L (ref 98–111)
Creatinine, Ser: 1.3 mg/dL — ABNORMAL HIGH (ref 0.61–1.24)
GFR, Estimated: 53 mL/min — ABNORMAL LOW (ref >60)
Glucose, Bld: 103 mg/dL — ABNORMAL HIGH (ref 70–99)
Potassium: 3.9 mmol/L (ref 3.5–5.1)
Sodium: 135 mmol/L (ref 135–145)

## 2022-07-24 LAB — GLUCOSE, CAPILLARY
Glucose-Capillary: 100 mg/dL — ABNORMAL HIGH (ref 70–99)
Glucose-Capillary: 126 mg/dL — ABNORMAL HIGH (ref 70–99)
Glucose-Capillary: 95 mg/dL (ref 70–99)
Glucose-Capillary: 83 mg/dL (ref 70–99)
Glucose-Capillary: 88 mg/dL (ref 70–99)

## 2022-07-24 LAB — CBC
HCT: 35.4 % — ABNORMAL LOW (ref 39.0–52.0)
Hemoglobin: 11.7 g/dL — ABNORMAL LOW (ref 13.0–17.0)
MCH: 34.9 pg — ABNORMAL HIGH (ref 26.0–34.0)
MCHC: 33.1 g/dL (ref 30.0–36.0)
MCV: 105.7 fL — ABNORMAL HIGH (ref 80.0–100.0)
Platelets: 162 10*3/uL (ref 150–400)
RBC: 3.35 MIL/uL — ABNORMAL LOW (ref 4.22–5.81)
RDW: 13.2 % (ref 11.5–15.5)
WBC: 8.3 10*3/uL (ref 4.0–10.5)
nRBC: 0 % (ref 0.0–0.2)

## 2022-07-24 MED ORDER — FUROSEMIDE 10 MG/ML IJ SOLN: 40.0000 mg | Freq: Once | INTRAMUSCULAR | Status: DC

## 2022-07-24 MED ORDER — FUROSEMIDE 10 MG/ML IJ SOLN
20.0000 mg | Freq: Once | INTRAMUSCULAR | Status: AC
  Administered 2022-07-24: 20 mg via INTRAVENOUS
  Filled 2022-07-24: qty 2

## 2022-07-24 MED ORDER — METRONIDAZOLE 500 MG/100ML IV SOLN
500.0000 mg | Freq: Two times a day (BID) | INTRAVENOUS | Status: DC
  Administered 2022-07-24 – 2022-07-28 (×8): 500 mg via INTRAVENOUS
  Filled 2022-07-24 (×8): qty 100

## 2022-07-24 NOTE — Progress Notes (Addendum)
Rounding Note    Patient Name: Billy Smith Date of Encounter: 07/24/2022  [place] Cardiologist: Lauree Chandler, MD   Subjective   Patient sleeping in bed on my exam this morning. His daughter is at the bedside. Upon waking up, patient is of clearer mind and more cooperative than when I last examined him last week. He still has some baseline confusion.  Inpatient Medications    Scheduled Meds:  apixaban  5 mg Oral BID   arformoterol  15 mcg Nebulization BID   atorvastatin  40 mg Oral QHS   bimatoprost  1 drop Both Eyes QHS   brimonidine  1 drop Both Eyes BID   budesonide  2 mL Inhalation BID   Chlorhexidine Gluconate Cloth  6 each Topical Q0600   dorzolamide-timolol  1 drop Right Eye BID   furosemide  80 mg Intravenous BID   guaiFENesin  1,200 mg Oral BID   insulin aspart  0-15 Units Subcutaneous TID WC   ipratropium-albuterol  3 mL Inhalation TID   levothyroxine  125 mcg Oral Q0600   methylPREDNISolone (SOLU-MEDROL) injection  40 mg Intravenous BID   mupirocin ointment  1 Application Nasal BID   primidone  125 mg Oral q1800   primidone  250 mg Oral q morning   propranolol  20 mg Oral BID   senna-docusate  1 tablet Oral Daily   Continuous Infusions:  azithromycin (ZITHROMAX) 500 mg in sodium chloride 0.9 % 250 mL IVPB Stopped (07/23/22 1049)   cefTRIAXone (ROCEPHIN)  IV 200 mL/hr at 07/24/22 0535   PRN Meds: acetaminophen **OR** acetaminophen, albuterol, dextromethorphan, food thickener, ondansetron **OR** ondansetron (ZOFRAN) IV   Vital Signs    Vitals:   07/24/22 0004 07/24/22 0434 07/24/22 0625 07/24/22 0730  BP: (!) 96/44 123/65  113/67  Pulse: 60 (!) 53  (!) 50  Resp: 20 18  20   Temp: 97.6 F (36.4 C) 97.8 F (36.6 C)  97.8 F (36.6 C)  TempSrc: Axillary Axillary  Axillary  SpO2: 98% 98%  100%  Weight:   105.1 kg   Height:        Intake/Output Summary (Last 24 hours) at 07/24/2022 0740 Last data filed at 07/24/2022 0535 Gross  per 24 hour  Intake 553.59 ml  Output 1150 ml  Net -596.41 ml      07/24/2022    6:25 AM 07/23/2022    6:00 AM 07/22/2022    5:00 AM  Last 3 Weights  Weight (lbs) 231 lb 11.3 oz 225 lb 15.5 oz 222 lb 7.1 oz  Weight (kg) 105.1 kg 102.5 kg 100.9 kg      Telemetry    Sinus rhythm, sinus bradycardia - Personally Reviewed  ECG    No new tracing  Physical Exam   GEN: No acute distress.   Neck: No JVD Cardiac: RRR, no murmurs, rubs, or gallops.  Respiratory: Bilateral lower lobe rhonchi, no rales. GI: Soft, nontender, non-distended  MS: No edema; No deformity. Neuro:  Nonfocal  Psych: Normal affect   Labs    High Sensitivity Troponin:   Recent Labs  Lab 07/19/22 2129 07/19/22 2317  TROPONINIHS 24* 24*     Chemistry Recent Labs  Lab 07/19/22 2129 07/19/22 2327 07/22/22 0753 07/23/22 0012 07/24/22 0032  NA 138   < > 143 142 135  K 3.9   < > 4.3 4.2 3.9  CL 99   < > 104 102 100  CO2 25   < > 28 32  26  GLUCOSE 124*   < > 104* 103* 103*  BUN 35*   < > 39* 39* 33*  CREATININE 1.52*   < > 1.25* 1.25* 1.30*  CALCIUM 8.8*   < > 9.1 8.9 8.5*  PROT 7.2  --   --   --   --   ALBUMIN 3.5  --   --   --   --   AST 32  --   --   --   --   ALT 23  --   --   --   --   ALKPHOS 79  --   --   --   --   BILITOT 0.6  --   --   --   --   GFRNONAA 44*   < > 56* 56* 53*  ANIONGAP 14   < > 11 8 9    < > = values in this interval not displayed.    Lipids No results for input(s): "CHOL", "TRIG", "HDL", "LABVLDL", "LDLCALC", "CHOLHDL" in the last 168 hours.  Hematology Recent Labs  Lab 07/22/22 0753 07/23/22 0012 07/24/22 0032  WBC 8.9 9.1 8.3  RBC 3.49* 3.12* 3.35*  HGB 12.4* 11.1* 11.7*  HCT 37.6* 32.7* 35.4*  MCV 107.7* 104.8* 105.7*  MCH 35.5* 35.6* 34.9*  MCHC 33.0 33.9 33.1  RDW 13.7 13.8 13.2  PLT 186 172 162   Thyroid No results for input(s): "TSH", "FREET4" in the last 168 hours.  BNP Recent Labs  Lab 07/19/22 2129 07/20/22 0902  BNP 84.6 79.7    DDimer No  results for input(s): "DDIMER" in the last 168 hours.   Radiology    No results found.  Cardiac Studies   05/02/2022 TTE   IMPRESSIONS    1. Left ventricular ejection fraction, by estimation, is 60 to 65%. The  left ventricle has normal function. Left ventricular endocardial border  not optimally defined to evaluate regional wall motion. There is mild  concentric left ventricular  hypertrophy. Left ventricular diastolic parameters are consistent with  Grade II diastolic dysfunction (pseudonormalization).   2. Right ventricular systolic function was not well visualized. The right  ventricular size is not well visualized. There is mildly elevated  pulmonary artery systolic pressure. The estimated right ventricular  systolic pressure is 39.2 mmHg.   3. The mitral valve is grossly normal. No evidence of mitral valve  regurgitation. No evidence of mitral stenosis.   4. The aortic valve is tricuspid. Aortic valve regurgitation is not  visualized. Aortic valve sclerosis is present, with no evidence of aortic  valve stenosis.   5. The inferior vena cava is dilated in size with <50% respiratory  variability, suggesting right atrial pressure of 15 mmHg.   Comparison(s): No significant change from prior study.   FINDINGS   Left Ventricle: Left ventricular ejection fraction, by estimation, is 60  to 65%. The left ventricle has normal function. Left ventricular  endocardial border not optimally defined to evaluate regional wall motion.  The left ventricular internal cavity  size was normal in size. There is mild concentric left ventricular  hypertrophy. Left ventricular diastolic parameters are consistent with  Grade II diastolic dysfunction (pseudonormalization).   Right Ventricle: The right ventricular size is not well visualized. Right  vetricular wall thickness was not well visualized. Right ventricular  systolic function was not well visualized. There is mildly elevated  pulmonary  artery systolic pressure. The  tricuspid regurgitant velocity is 2.46 m/s, and with an assumed right  atrial pressure of 15 mmHg, the estimated right ventricular systolic  pressure is 123XX123 mmHg.   Left Atrium: Left atrial size was normal in size.   Right Atrium: Right atrial size was normal in size.   Pericardium: Trivial pericardial effusion is present. Presence of  epicardial fat layer.   Mitral Valve: The mitral valve is grossly normal. No evidence of mitral  valve regurgitation. No evidence of mitral valve stenosis.   Tricuspid Valve: The tricuspid valve is grossly normal. Tricuspid valve  regurgitation is trivial. No evidence of tricuspid stenosis.   Aortic Valve: The aortic valve is tricuspid. Aortic valve regurgitation is  not visualized. Aortic valve sclerosis is present, with no evidence of  aortic valve stenosis. Aortic valve peak gradient measures 4.0 mmHg.   Pulmonic Valve: The pulmonic valve was grossly normal. Pulmonic valve  regurgitation is not visualized. No evidence of pulmonic stenosis.   Aorta: The aortic root and ascending aorta are structurally normal, with  no evidence of dilitation.   Venous: The inferior vena cava is dilated in size with less than 50%  respiratory variability, suggesting right atrial pressure of 15 mmHg.   IAS/Shunts: The atrial septum is grossly normal.     Patient Profile     Billy Smith is a 88 y.o. male with a hx of chronic diastolic CHF, COPD (not on home O2), CAD s/p CABG, atrial flutter (on Eliquis) who is being seen 07/20/2022 for the evaluation of CHF at the request of Dr. Tyrell Antonio.   Assessment & Plan    Chronic diastolic CHF    Patient with LVEF 60-65%, grade II diastolic dysfunction on [id number] TTE. BNP this admission 84.6, 79.7. Has been IV diuresed with net output of -2.44L this admission. Weights do not appear to be accurate as patient up to 105.1kg despite net negative status. Lasix increased from 40mg  BID to  80mg  BID over the weekend. . Continue ABX/steroids as below per primary team Continue Lasix 80mg  BID today, creatinine/K stable. Plan transition to oral Torsemide tomorrow (per daughter, patient has failed PO lasix before).    Acute hypoxic respiratory failure with pneumonia Acute COPD exacerbation Concern for aspiration with dysphagia  Concern for aspiration pneumonia on admission. Patient underwent speech evaluation on 12/02, was ultimately able to tolerate thin liquids and nectars. Decision made to initiate regular diet with nectar thick liquids.   Continue with Vancomycin, Azithromycin, Ceftriaxone per primary team.  Continue Solumedrol Continue nebulizer treatment, guaifenesin, brovana  Hx atrial fibrillation/atrial flutter   Patient with history of fib/flutter was initially noted to be in afib with RVR. Is now rate controlled, in normal sinus rhythm. Continue rate control with Propranolol, anticoagulation with Eliquis.   Per primary team:  Acute metabolic encephalopathy Essential tremor Hypothyroidism       For questions or updates, please contact [place] Please consult [email or web address] for contact info under        Signed, Lily Kocher, PA-C  07/24/2022, 7:40 AM    Long talk with daughter Patient more clear minded but frail. Good diuresis change to oral demedex in am. Still with aspiration risk More euvolemic and encephalopathy improved EF normal by TTE 60-65% and BNP not elevated. Cardiac status stable  Jenkins Rouge MD [facility]

## 2022-07-24 NOTE — Progress Notes (Signed)
Mobility Specialist Progress Note    07/24/22 1553  Mobility  Activity Transferred from chair to bed  Level of Assistance +2 (takes two people)  Press photographer wheel walker  Distance Ambulated (ft) 2 ft  Activity Response Tolerated fair  Mobility Referral Yes  $Mobility charge 1 Mobility   Post-Mobility: 69 HR, 97% SpO2  Left with call bell in reach.   [place] Mobility Specialist  Please Neurosurgeon or Rehab Office at [phone]

## 2022-07-24 NOTE — Progress Notes (Signed)
Started coughing out white frothy sputum and could hardly spit it out. Sat went down to low 80's. Oral suctioning done and obtained white frothy sputum. Placed on oxygen 5L [place] but with  continuous coughing. Respiratory did deep nasal suctioning and obtained more secretions with same consistency.. felt better after  suctioning done. Placed on 3 L high flow nasal cannula sating at 97%. 6 Pm dose of lasix 80 mg iv given. Voided at once to 300 ml pale yellow urine via.  primo fit. MD made aware and came to see pt at once with order for chest x-ray. Continue to monitor. Breathing easy and regular.

## 2022-07-24 NOTE — Progress Notes (Signed)
Mobility Specialist Progress Note    07/24/22 1118  Mobility  Activity Ambulated with assistance in room  Level of Assistance Moderate assist, patient does 50-74%  Assistive Device Front wheel walker  Distance Ambulated (ft) 4 ft  Activity Response Tolerated well  Mobility Referral Yes  $Mobility charge 1 Mobility   Pre-Mobility: 62 HR, 104/63 (75) BP, 95% SpO2 Post-Mobility: 60 HR, 94% SpO2  Pt received in bed and agreeable to get to chair. Supervision for bed mobility. ModA to stand and for steps. Left with call bell in reach and chair alarm on.   [place] Mobility Specialist  Please Contact via SecureChat or Rehab Office at [phone]

## 2022-07-24 NOTE — Plan of Care (Signed)
  Problem: Coping: Goal: Ability to adjust to condition or change in health will improve Outcome: Progressing   Problem: Fluid Volume: Goal: Ability to maintain a balanced intake and output will improve Outcome: Progressing   Problem: Health Behavior/Discharge Planning: Goal: Ability to identify and utilize available resources and services will improve Outcome: Progressing Goal: Ability to manage health-related needs will improve Outcome: Progressing   Problem: Metabolic: Goal: Ability to maintain appropriate glucose levels will improve Outcome: Progressing   Problem: Nutritional: Goal: Maintenance of adequate nutrition will improve Outcome: Progressing Goal: Progress toward achieving an optimal weight will improve Outcome: Progressing   Problem: Skin Integrity: Goal: Risk for impaired skin integrity will decrease Outcome: Progressing   Problem: Tissue Perfusion: Goal: Adequacy of tissue perfusion will improve Outcome: Progressing   Problem: Education: Goal: Knowledge of General Education information will improve Description: Including pain rating scale, medication(s)/side effects and non-pharmacologic comfort measures Outcome: Progressing   Problem: Health Behavior/Discharge Planning: Goal: Ability to manage health-related needs will improve Outcome: Progressing

## 2022-07-24 NOTE — Progress Notes (Signed)
Called to pts room for desating and not being able to clear necretions. RT suctioned out mouth and nose and put pt on 3L [place] and sats came back up.

## 2022-07-24 NOTE — Progress Notes (Signed)
PROGRESS NOTE    Billy Smith  [id number] DOB: [birth year] DOA: 07/19/2022 PCP: Nicoletta Dress, MD   Brief Narrative: 88 year old with past medical history significant for chronic diastolic heart failure, history of COPD not on oxygen at home, CAD status post CABG, A-flutter on Eliquis prior hospitalization September for acute respiratory failure with hypercapnia in the setting of aspiration pneumonia.  Prior to that hospitalization he was hospitalized at [facility] for pneumonia as well.  He presented with worsening cough over the last 2 days, worsening hypoxemia.  Evaluation in the ED chest x-ray raise concern for pneumonia.  Patient admitted for pneumonia.   Assessment & Plan:   Principal Problem:   HCAP (healthcare-associated pneumonia) Active Problems:   Acute metabolic encephalopathy   Atrial flutter (HCC)   COPD (chronic obstructive pulmonary disease) (HCC)   Coronary artery disease   Acute respiratory failure with hypoxia (HCC)   Chronic diastolic CHF (congestive heart failure) (HCC)   1-Acute  Hypoxic Respiratory Failure associated with pneumonia and acute COPD exacerbation: -Concern for aspiration pneumonia, a speech has been consulted. -MRSA PCR psotive.  -Continue with  Vancomycin,  Azithro. Change Cefepime to Ceftriaxone to prevent encephalopathy/. Day 4/7 -Continue with Solumedrol IV -Continue with Nebulizer treatment.  -Continue with guaifenesin,  brovana.  -Evaluated by speech 12/01, concern for aspiration. Keep NPO for now.  -Speech evaluation 12/02: plan to proceed regular, Nectar Thick liquid. Daughter prefer to defer MBS/  Flutter valve.  Continue with current management. Less wheezing. Plan to change to prednisone tomorrow.   Acute metabolic encephalopathy: -In setting of infection, acute illness.  -Support care.  -ABG PH compensated.  Taper down oxygen, Oxygen sat goal 90--% Change cefepime to ceftriaxone to avoid encephalopathy.  He is getting  more alert, some agitation, and sign that he might be getting to his baseline.   AKI: Creatinine 1.0 Presented  with a creatinine 1.5 Continue to monitor on lasix.   History of a flutter/A-fib: -Continue with Eliquis  CAD status post CABG: -Continue with Lipitor.   Acute on Chronic diastolic heart failure: He recently had increase torsemide dose to 60 in am and 40 at hs. He was previously on 40 Mg BID>  Cardiology have been consulted.  IV lasix change to 80 mg IV BID. 12/03 Daily weight. 227---222--225--223  Negative 1 l Weight on discharge last hospitalization 224.  Goals of Care:  DNR/DNI appropriately.  Palliative care consulted.  Daughter wants to try to get him to his prior level of health.   Essential tremor; Continue with Propanolol and and Primidone.  Hypothyroidism; Continue with Synthroid.   Sleep Apnea; CPAP ordered.      Estimated body mass index is 37.27 kg/m as calculated from the following:   Height as of this encounter: 5\' 5"  (1.651 m).   Weight as of this encounter: 101.6 kg.   DVT prophylaxis: Eliquis Code Status: DNR Family Communication: Care discussed with Daughter  Disposition Plan:  Status is: Inpatient Remains inpatient appropriate because: Management resp failure. SNF in 24--48 hours.     Consultants:  Cardiology   Procedures:    Antimicrobials:    Subjective: He is less confuse, said "your hands are cold. He report breathing better. Cough some improvement.    Objective: Vitals:   07/24/22 0900 07/24/22 0907 07/24/22 1108 07/24/22 1501  BP:   104/63   Pulse:   (!) 58 67  Resp:    18  Temp:      TempSrc:   Oral  SpO2: 95%  96% 92%  Weight:  101.6 kg    Height:        Intake/Output Summary (Last 24 hours) at 07/24/2022 1548 Last data filed at 07/24/2022 1200 Gross per 24 hour  Intake 853.59 ml  Output 1950 ml  Net -1096.41 ml    Filed Weights   07/23/22 0600 07/24/22 0625 07/24/22 0907  Weight: 102.5 kg 105.1  kg 101.6 kg    Examination:  General exam: NAD Respiratory system: Less wheezing,  Cardiovascular system: S,1  S 2 RRR Gastrointestinal system: BS present, soft nt Central nervous system: Alert Extremities: trace edema   Data Reviewed: I have personally reviewed following labs and imaging studies  CBC: Recent Labs  Lab 07/19/22 2129 07/19/22 2327 07/20/22 0902 07/21/22 0350 07/22/22 0753 07/23/22 0012 07/24/22 0032  WBC 9.7  --  8.7 7.9 8.9 9.1 8.3  NEUTROABS 7.0  --   --   --   --   --   --   HGB 13.8   < > 12.2* 11.7* 12.4* 11.1* 11.7*  HCT 40.5   < > 35.0* 34.6* 37.6* 32.7* 35.4*  MCV 105.7*  --  104.2* 107.1* 107.7* 104.8* 105.7*  PLT 191  --  153 143* 186 172 162   < > = values in this interval not displayed.    Basic Metabolic Panel: Recent Labs  Lab 07/20/22 0902 07/21/22 0350 07/22/22 0753 07/23/22 0012 07/24/22 0032  NA 140 143 143 142 135  K 4.1 4.5 4.3 4.2 3.9  CL 97* 98 104 102 100  CO2 29 31 28  32 26  GLUCOSE 139* 133* 104* 103* 103*  BUN 35* 38* 39* 39* 33*  CREATININE 1.38* 1.28* 1.25* 1.25* 1.30*  CALCIUM 8.6* 9.1 9.1 8.9 8.5*    GFR: Estimated Creatinine Clearance: 43.9 mL/min (A) (by C-G formula based on SCr of 1.3 mg/dL (H)). Liver Function Tests: Recent Labs  Lab 07/19/22 2129  AST 32  ALT 23  ALKPHOS 79  BILITOT 0.6  PROT 7.2  ALBUMIN 3.5    No results for input(s): "LIPASE", "AMYLASE" in the last 168 hours. No results for input(s): "AMMONIA" in the last 168 hours. Coagulation Profile: No results for input(s): "INR", "PROTIME" in the last 168 hours. Cardiac Enzymes: No results for input(s): "CKTOTAL", "CKMB", "CKMBINDEX", "TROPONINI" in the last 168 hours. BNP (last 3 results) No results for input(s): "PROBNP" in the last 8760 hours. HbA1C: No results for input(s): "HGBA1C" in the last 72 hours.  CBG: Recent Labs  Lab 07/23/22 1129 07/23/22 1642 07/23/22 2059 07/24/22 0623 07/24/22 1110  GLUCAP 151* 99 100* 126*  95    Lipid Profile: No results for input(s): "CHOL", "HDL", "LDLCALC", "TRIG", "CHOLHDL", "LDLDIRECT" in the last 72 hours. Thyroid Function Tests: No results for input(s): "TSH", "T4TOTAL", "FREET4", "T3FREE", "THYROIDAB" in the last 72 hours. Anemia Panel: No results for input(s): "VITAMINB12", "FOLATE", "FERRITIN", "TIBC", "IRON", "RETICCTPCT" in the last 72 hours. Sepsis Labs: Recent Labs  Lab 07/20/22 0046 07/20/22 0214 07/21/22 0350  PROCALCITON  --  <0.10 <0.10  LATICACIDVEN 1.3  --   --      Recent Results (from the past 240 hour(s))  Resp Panel by RT-PCR (Flu A&B, Covid) Anterior Nasal Swab     Status: None   Collection Time: 07/19/22  9:29 PM   Specimen: Anterior Nasal Swab  Result Value Ref Range Status   SARS Coronavirus 2 by RT PCR NEGATIVE NEGATIVE Final    Comment: (NOTE) SARS-CoV-2 target  nucleic acids are NOT DETECTED.  The SARS-CoV-2 RNA is generally detectable in upper respiratory specimens during the acute phase of infection. The lowest concentration of SARS-CoV-2 viral copies this assay can detect is 138 copies/mL. A negative result does not preclude SARS-Cov-2 infection and should not be used as the sole basis for treatment or other patient management decisions. A negative result may occur with  improper specimen collection/handling, submission of specimen other than nasopharyngeal swab, presence of viral mutation(s) within the areas targeted by this assay, and inadequate number of viral copies(<138 copies/mL). A negative result must be combined with clinical observations, patient history, and epidemiological information. The expected result is Negative.  Fact Sheet for Patients:  EntrepreneurPulse.com.au  Fact Sheet for Healthcare Providers:  IncredibleEmployment.be  This test is no t yet approved or cleared by the [place] FDA and  has been authorized for detection and/or diagnosis of SARS-CoV-2 by FDA  under an Emergency Use Authorization (EUA). This EUA will remain  in effect (meaning this test can be used) for the duration of the COVID-19 declaration under Section 564(b)(1) of the Act, 21 U.S.C.section 360bbb-3(b)(1), unless the authorization is terminated  or revoked sooner.       Influenza A by PCR NEGATIVE NEGATIVE Final   Influenza B by PCR NEGATIVE NEGATIVE Final    Comment: (NOTE) The Xpert Xpress SARS-CoV-2/FLU/RSV plus assay is intended as an aid in the diagnosis of influenza from Nasopharyngeal swab specimens and should not be used as a sole basis for treatment. Nasal washings and aspirates are unacceptable for Xpert Xpress SARS-CoV-2/FLU/RSV testing.  Fact Sheet for Patients: EntrepreneurPulse.com.au  Fact Sheet for Healthcare Providers: IncredibleEmployment.be  This test is not yet approved or cleared by the [place] FDA and has been authorized for detection and/or diagnosis of SARS-CoV-2 by FDA under an Emergency Use Authorization (EUA). This EUA will remain in effect (meaning this test can be used) for the duration of the COVID-19 declaration under Section 564(b)(1) of the Act, 21 U.S.C. section 360bbb-3(b)(1), unless the authorization is terminated or revoked.  Performed at [facility], [place] [address], [place], [place] [address]   Urine Culture     Status: None   Collection Time: 07/19/22 10:31 PM   Specimen: Urine, Clean Catch  Result Value Ref Range Status   Specimen Description URINE, CLEAN CATCH  Final   Special Requests NONE  Final   Culture   Final    NO GROWTH Performed at [facility], [place] [address], [place], [place] [address]    Report Status 07/21/2022 FINAL  Final  Blood culture (routine x 2)     Status: None (Preliminary result)   Collection Time: 07/19/22 10:36 PM   Specimen: BLOOD  Result Value Ref Range Status   Specimen Description BLOOD LEFT ANTECUBITAL  Final   Special Requests    Final    BOTTLES DRAWN AEROBIC AND ANAEROBIC Blood Culture adequate volume   Culture   Final    NO GROWTH 4 DAYS Performed at [facility], [place] [address], [place], [place] [address]    Report Status PENDING  Incomplete  Blood culture (routine x 2)     Status: None (Preliminary result)   Collection Time: 07/19/22 11:18 PM   Specimen: BLOOD LEFT FOREARM  Result Value Ref Range Status   Specimen Description BLOOD LEFT FOREARM  Final   Special Requests   Final    BOTTLES DRAWN AEROBIC AND ANAEROBIC Blood Culture results may not  be optimal due to an excessive volume of blood received in culture bottles   Culture   Final    NO GROWTH 4 DAYS Performed at [facility], [address], [place], [place] [address]    Report Status PENDING  Incomplete  MRSA Next Gen by PCR, Nasal     Status: Abnormal   Collection Time: 07/20/22  4:01 AM   Specimen: Nasal Mucosa; Nasal Swab  Result Value Ref Range Status   MRSA by PCR Next Gen DETECTED (A) NOT DETECTED Final    Comment: CRITICAL RESULT CALLED TO, READ BACK BY AND VERIFIED WITH: BRIANA LEAKE RN ON 11.30.23 AT 0818 BY EM (NOTE) The GeneXpert MRSA Assay (FDA approved for NASAL specimens only), is one component of a comprehensive MRSA colonization surveillance program. It is not intended to diagnose MRSA infection nor to guide or monitor treatment for MRSA infections. Test performance is not FDA approved in patients less than 31 years old. Performed at [facility], [address] [place], [place] [address]          Radiology Studies: No results found.      Scheduled Meds:  apixaban  5 mg Oral BID   arformoterol  15 mcg Nebulization BID   atorvastatin  40 mg Oral QHS   bimatoprost  1 drop Both Eyes QHS   brimonidine  1 drop Both Eyes BID   budesonide  2 mL Inhalation BID   Chlorhexidine Gluconate Cloth  6 each Topical Q0600   dorzolamide-timolol  1 drop Right Eye BID   furosemide  80 mg Intravenous BID    guaiFENesin  1,200 mg Oral BID   insulin aspart  0-15 Units Subcutaneous TID WC   ipratropium-albuterol  3 mL Inhalation TID   levothyroxine  125 mcg Oral Q0600   methylPREDNISolone (SOLU-MEDROL) injection  40 mg Intravenous BID   mupirocin ointment  1 Application Nasal BID   primidone  125 mg Oral q1800   primidone  250 mg Oral q morning   propranolol  20 mg Oral BID   senna-docusate  1 tablet Oral Daily   Continuous Infusions:  cefTRIAXone (ROCEPHIN)  IV 2 g (07/24/22 1011)     LOS: 4 days    Time spent: 35 minutes    Caitlain Tweed A Kohner Orlick, MD [facility]   If 7PM-7AM, please contact night-coverage [email or web address]  07/24/2022, 3:48 PM

## 2022-07-24 NOTE — Progress Notes (Signed)
RT placed patient on CPAP HS. 4L O2 bleed in needed. Patient tolerating well at this time.  

## 2022-07-25 ENCOUNTER — Inpatient Hospital Stay (HOSPITAL_COMMUNITY): Payer: PPO

## 2022-07-25 DIAGNOSIS — I480 Paroxysmal atrial fibrillation: Secondary | ICD-10-CM | POA: Diagnosis not present

## 2022-07-25 DIAGNOSIS — J189 Pneumonia, unspecified organism: Secondary | ICD-10-CM | POA: Diagnosis not present

## 2022-07-25 DIAGNOSIS — Z5181 Encounter for therapeutic drug level monitoring: Secondary | ICD-10-CM | POA: Diagnosis not present

## 2022-07-25 DIAGNOSIS — Z7901 Long term (current) use of anticoagulants: Secondary | ICD-10-CM

## 2022-07-25 LAB — CULTURE, BLOOD (ROUTINE X 2)
Culture: NO GROWTH
Culture: NO GROWTH
Special Requests: ADEQUATE

## 2022-07-25 LAB — CBC
HCT: 34 % — ABNORMAL LOW (ref 39.0–52.0)
Hemoglobin: 11.9 g/dL — ABNORMAL LOW (ref 13.0–17.0)
MCH: 35.8 pg — ABNORMAL HIGH (ref 26.0–34.0)
MCHC: 35 g/dL (ref 30.0–36.0)
MCV: 102.4 fL — ABNORMAL HIGH (ref 80.0–100.0)
Platelets: 177 10*3/uL (ref 150–400)
RBC: 3.32 MIL/uL — ABNORMAL LOW (ref 4.22–5.81)
RDW: 13.2 % (ref 11.5–15.5)
WBC: 7.7 10*3/uL (ref 4.0–10.5)
nRBC: 0 % (ref 0.0–0.2)

## 2022-07-25 LAB — GLUCOSE, CAPILLARY
Glucose-Capillary: 115 mg/dL — ABNORMAL HIGH (ref 70–99)
Glucose-Capillary: 121 mg/dL — ABNORMAL HIGH (ref 70–99)
Glucose-Capillary: 135 mg/dL — ABNORMAL HIGH (ref 70–99)
Glucose-Capillary: 103 mg/dL — ABNORMAL HIGH (ref 70–99)

## 2022-07-25 LAB — BASIC METABOLIC PANEL
Anion gap: 8 (ref 5–15)
BUN: 33 mg/dL — ABNORMAL HIGH (ref 8–23)
CO2: 30 mmol/L (ref 22–32)
Calcium: 8.9 mg/dL (ref 8.9–10.3)
Chloride: 100 mmol/L (ref 98–111)
Creatinine, Ser: 0.95 mg/dL (ref 0.61–1.24)
GFR, Estimated: >60 mL/min (ref >60)
Glucose, Bld: 127 mg/dL — ABNORMAL HIGH (ref 70–99)
Potassium: 3.9 mmol/L (ref 3.5–5.1)
Sodium: 138 mmol/L (ref 135–145)

## 2022-07-25 MED ORDER — VANCOMYCIN HCL IN DEXTROSE 1-5 GM/200ML-% IV SOLN
1000.0000 mg | INTRAVENOUS | Status: AC
  Administered 2022-07-25 – 2022-07-26 (×2): 1000 mg via INTRAVENOUS
  Filled 2022-07-25 (×2): qty 200

## 2022-07-25 MED ORDER — TORSEMIDE 20 MG PO TABS: 20.0000 mg | ORAL_TABLET | Freq: Once | ORAL | Status: DC

## 2022-07-25 MED ORDER — TORSEMIDE 20 MG PO TABS
60.0000 mg | ORAL_TABLET | Freq: Two times a day (BID) | ORAL | Status: DC
  Administered 2022-07-25 – 2022-07-28 (×6): 60 mg via ORAL
  Filled 2022-07-25 (×6): qty 3

## 2022-07-25 MED ORDER — TORSEMIDE 20 MG PO TABS
40.0000 mg | ORAL_TABLET | Freq: Two times a day (BID) | ORAL | Status: DC
  Administered 2022-07-25: 40 mg via ORAL
  Filled 2022-07-25: qty 2

## 2022-07-25 MED ORDER — VANCOMYCIN HCL 750 MG/150ML IV SOLN
750.0000 mg | INTRAVENOUS | Status: DC
  Filled 2022-07-25: qty 150

## 2022-07-25 NOTE — Progress Notes (Addendum)
Pharmacy Antibiotic Note  Billy Smith is a 88 y.o. male admitted on 07/19/2022 with pneumonia.   Azithromycin and ceftriaxone complete today. Pharmacy consulted to resume vancomycin to complete 7 days total, today is day 6.   Renal function improved. He is afebrile, WBC are normal. Cultures negative, MRSA PCR +.  Plan: Vancomycin 1000 mg IV q24h x 2 doses Goal AUC 400-550. Expected AUC: 447.9 SCr used: 0.95   Regarding apixaban dose and primidone. These two agents are known to have a DDI whereby apixaban exposure is reduced by primidone. Per previous notes, this was explained to patient's daughter who declined to taper primidone or change to warfarin. He meets criteria for full dose apixaban based on weight and renal function and recommend keeping this dose in light of DDI with primidone.   Height: 5\' 5"  (165.1 cm) Weight: 101.6 kg (223 lb 15.8 oz) IBW/kg (Calculated) : 61.5  Temp (24hrs), Avg:97.9 F (36.6 C), Min:97.3 F (36.3 C), Max:98.4 F (36.9 C)  Recent Labs  Lab 07/20/22 0046 07/20/22 0902 07/21/22 0350 07/22/22 0753 07/23/22 0012 07/24/22 0032 07/25/22 0016  WBC  --    < > 7.9 8.9 9.1 8.3 7.7  CREATININE  --    < > 1.28* 1.25* 1.25* 1.30* 0.95  LATICACIDVEN 1.3  --   --   --   --   --   --    < > = values in this interval not displayed.     Estimated Creatinine Clearance: 60.1 mL/min (by C-G formula based on SCr of 0.95 mg/dL).    Allergies  Allergen Reactions   Xalatan [Latanoprost]     Xalatan (latanoprost) does not control ocular pressure.  Must use Lumigan (bimatoprost).   Cefepime 11/30 >> 12/1 CTX 11/29 x1, 12/1 >> 12/5 Vanc 11/30 >>  Azith 11/29 >> 12/4  11/30 MRSA PCR: positive 11/29 BCx: neg 11/29 BCx: neg 11/29 Ucx: neg 11/29 covid pcr: neg, flu neg   Thank you for involving pharmacy in this patient's care.  12/29, PharmD, BCPS Clinical Pharmacist Clinical phone for 07/25/2022 is [phone] 07/25/2022 8:57 AM

## 2022-07-25 NOTE — Progress Notes (Addendum)
Rounding Note    Patient Name: Billy Smith Date of Encounter: 07/25/2022  [facility] Cardiologist: Verne Carrow, MD   Subjective   Patient alert, laying in bed this morning.  Cognitive status continues to appear to be improving.  Patient's daughter is also at bedside.  Advises me that patient had a period of significant phlegm/secretions yesterday evening resulting in oxygen desaturation.  Patient was suctioned and given an extra dose of Lasix 20 mg, although it does not appear that patient had significant pulmonary edema.  Dr. Sunnie Nielsen also at bedside this morning, says that she notes a significant improvement in patient's pulmonary exam this morning compared with yesterday evening.  Patient's daughter says that she did not notice patient struggling with eating/swallowing yesterday.  The episode of shortness of breath and phlegm occurred approximately 3 hours following a meal.  Inpatient Medications    Scheduled Meds:  apixaban  5 mg Oral BID   arformoterol  15 mcg Nebulization BID   atorvastatin  40 mg Oral QHS   bimatoprost  1 drop Both Eyes QHS   brimonidine  1 drop Both Eyes BID   budesonide  2 mL Inhalation BID   Chlorhexidine Gluconate Cloth  6 each Topical Q0600   dorzolamide-timolol  1 drop Right Eye BID   guaiFENesin  1,200 mg Oral BID   insulin aspart  0-15 Units Subcutaneous TID WC   ipratropium-albuterol  3 mL Inhalation TID   levothyroxine  125 mcg Oral Q0600   methylPREDNISolone (SOLU-MEDROL) injection  40 mg Intravenous BID   mupirocin ointment  1 Application Nasal BID   primidone  125 mg Oral q1800   primidone  250 mg Oral q morning   propranolol  20 mg Oral BID   senna-docusate  1 tablet Oral Daily   torsemide  40 mg Oral BID   Continuous Infusions:  cefTRIAXone (ROCEPHIN)  IV Stopped (07/24/22 1044)   metronidazole 500 mg (07/25/22 0621)   PRN Meds: acetaminophen **OR** acetaminophen, albuterol, dextromethorphan, food thickener,  ondansetron **OR** ondansetron (ZOFRAN) IV   Vital Signs    Vitals:   07/24/22 1953 07/24/22 2000 07/25/22 0041 07/25/22 0413  BP:  (!) 152/74 115/67 115/72  Pulse:  63 (!) 57 63  Resp: 18 18 18 18   Temp:  98.4 F (36.9 C) 97.9 F (36.6 C) 97.9 F (36.6 C)  TempSrc:  Oral Axillary Axillary  SpO2:  98% 98% 98%  Weight:      Height:        Intake/Output Summary (Last 24 hours) at 07/25/2022 0738 Last data filed at 07/25/2022 0157 Gross per 24 hour  Intake 500 ml  Output 2250 ml  Net -1750 ml      07/24/2022    9:07 AM 07/24/2022    6:25 AM 07/23/2022    6:00 AM  Last 3 Weights  Weight (lbs) 223 lb 15.8 oz 231 lb 11.3 oz 225 lb 15.5 oz  Weight (kg) 101.6 kg 105.1 kg 102.5 kg      Telemetry    Sinus rhythm - Personally Reviewed  ECG    No new tracing  Physical Exam   GEN: No acute distress.   Neck: No JVD Cardiac: RRR, no murmurs, rubs, or gallops.  Respiratory: Diffusely diminished, most noticeably in left lower lobe. No rales appreciated. GI: Soft, nontender, non-distended  MS: No edema; No deformity. Neuro:  Nonfocal  Psych: Normal affect   Labs    High Sensitivity Troponin:   Recent Labs  Lab 07/19/22 2129 07/19/22 2317  TROPONINIHS 24* 24*     Chemistry Recent Labs  Lab 07/19/22 2129 07/19/22 2327 07/23/22 0012 07/24/22 0032 07/25/22 0016  NA 138   < > 142 135 138  K 3.9   < > 4.2 3.9 3.9  CL 99   < > 102 100 100  CO2 25   < > 32 26 30  GLUCOSE 124*   < > 103* 103* 127*  BUN 35*   < > 39* 33* 33*  CREATININE 1.52*   < > 1.25* 1.30* 0.95  CALCIUM 8.8*   < > 8.9 8.5* 8.9  PROT 7.2  --   --   --   --   ALBUMIN 3.5  --   --   --   --   AST 32  --   --   --   --   ALT 23  --   --   --   --   ALKPHOS 79  --   --   --   --   BILITOT 0.6  --   --   --   --   GFRNONAA 44*   < > 56* 53* >60  ANIONGAP 14   < > 8 9 8    < > = values in this interval not displayed.    Lipids No results for input(s): "CHOL", "TRIG", "HDL", "LABVLDL", "LDLCALC",  "CHOLHDL" in the last 168 hours.  Hematology Recent Labs  Lab 07/23/22 0012 07/24/22 0032 07/25/22 0016  WBC 9.1 8.3 7.7  RBC 3.12* 3.35* 3.32*  HGB 11.1* 11.7* 11.9*  HCT 32.7* 35.4* 34.0*  MCV 104.8* 105.7* 102.4*  MCH 35.6* 34.9* 35.8*  MCHC 33.9 33.1 35.0  RDW 13.8 13.2 13.2  PLT 172 162 177   Thyroid No results for input(s): "TSH", "FREET4" in the last 168 hours.  BNP Recent Labs  Lab 07/19/22 2129 07/20/22 0902  BNP 84.6 79.7    DDimer No results for input(s): "DDIMER" in the last 168 hours.   Radiology    DG CHEST PORT 1 VIEW  Result Date: 07/24/2022 CLINICAL DATA:  Shortness of breath and hypoxemia. EXAM: PORTABLE CHEST 1 VIEW COMPARISON:  Chest radiograph dated 06/23/2022. FINDINGS: Left lung base atelectasis/scarring versus infiltrate. The right lung is clear. There is no pleural effusion or pneumothorax. Stable cardiomediastinal silhouette. Median sternotomy wires and CABG vascular clips. Atherosclerotic calcification of the aorta. No acute osseous pathology. IMPRESSION: Left lung base atelectasis/scarring versus infiltrate. Electronically Signed   By: Anner Crete M.D.   On: 07/24/2022 17:11    Cardiac Studies    05/02/2022 TTE   IMPRESSIONS    1. Left ventricular ejection fraction, by estimation, is 60 to 65%. The  left ventricle has normal function. Left ventricular endocardial border  not optimally defined to evaluate regional wall motion. There is mild  concentric left ventricular  hypertrophy. Left ventricular diastolic parameters are consistent with  Grade II diastolic dysfunction (pseudonormalization).   2. Right ventricular systolic function was not well visualized. The right  ventricular size is not well visualized. There is mildly elevated  pulmonary artery systolic pressure. The estimated right ventricular  systolic pressure is 123XX123 mmHg.   3. The mitral valve is grossly normal. No evidence of mitral valve  regurgitation. No evidence of  mitral stenosis.   4. The aortic valve is tricuspid. Aortic valve regurgitation is not  visualized. Aortic valve sclerosis is present, with no evidence of aortic  valve stenosis.  5. The inferior vena cava is dilated in size with <50% respiratory  variability, suggesting right atrial pressure of 15 mmHg.   Comparison(s): No significant change from prior study.   FINDINGS   Left Ventricle: Left ventricular ejection fraction, by estimation, is 60  to 65%. The left ventricle has normal function. Left ventricular  endocardial border not optimally defined to evaluate regional wall motion.  The left ventricular internal cavity  size was normal in size. There is mild concentric left ventricular  hypertrophy. Left ventricular diastolic parameters are consistent with  Grade II diastolic dysfunction (pseudonormalization).   Right Ventricle: The right ventricular size is not well visualized. Right  vetricular wall thickness was not well visualized. Right ventricular  systolic function was not well visualized. There is mildly elevated  pulmonary artery systolic pressure. The  tricuspid regurgitant velocity is 2.46 m/s, and with an assumed right  atrial pressure of 15 mmHg, the estimated right ventricular systolic  pressure is 123XX123 mmHg.   Left Atrium: Left atrial size was normal in size.   Right Atrium: Right atrial size was normal in size.   Pericardium: Trivial pericardial effusion is present. Presence of  epicardial fat layer.   Mitral Valve: The mitral valve is grossly normal. No evidence of mitral  valve regurgitation. No evidence of mitral valve stenosis.   Tricuspid Valve: The tricuspid valve is grossly normal. Tricuspid valve  regurgitation is trivial. No evidence of tricuspid stenosis.   Aortic Valve: The aortic valve is tricuspid. Aortic valve regurgitation is  not visualized. Aortic valve sclerosis is present, with no evidence of  aortic valve stenosis. Aortic valve peak  gradient measures 4.0 mmHg.   Pulmonic Valve: The pulmonic valve was grossly normal. Pulmonic valve  regurgitation is not visualized. No evidence of pulmonic stenosis.   Aorta: The aortic root and ascending aorta are structurally normal, with  no evidence of dilitation.   Venous: The inferior vena cava is dilated in size with less than 50%  respiratory variability, suggesting right atrial pressure of 15 mmHg.   IAS/Shunts: The atrial septum is grossly normal.     Patient Profile     Billy Smith is a 87 y.o. male with a hx of chronic diastolic CHF, COPD (not on home O2), CAD s/p CABG, atrial flutter (on Eliquis) who is being seen 07/20/2022 for the evaluation of CHF at the request of Dr. Tyrell Antonio.   Assessment & Plan    Chronic diastolic CHF     Patient with LVEF 60-65%, grade II diastolic dysfunction on [id number] TTE. BNP this admission 84.6, 79.7. Has been IV diuresed with net output of -4.185L this admission. Weights do not appear to be accurate. Lasix increased from 40mg  BID to 80mg  BID over the weekend, continued through yesterday with good response.  . Continue ABX/steroids as below per primary team Patient euvolemic appearing. Transition to PO torsemide today, 60mg  BID. (Patient on recent home dose of 60mg  AM, 40mg  PM per daughter). Would like to see how patient's volume status responds to slightly increased daily dosing.     Acute hypoxic respiratory failure with pneumonia Acute COPD exacerbation Concern for aspiration with dysphagia   Concern for aspiration pneumonia on admission. Patient underwent speech evaluation on 12/02, was ultimately able to tolerate thin liquids and nectars. Decision made to initiate regular diet with nectar thick liquids.  Patient with late evening episode of phlegm/secretions resulting in hypoxia.  Patient now back on oxygen support.  No clear cardiac correlation.   Continue  with Vancomycin, Azithromycin, Ceftriaxone per primary team.   Continue Solumedrol Continue nebulizer treatment, guaifenesin, brovana Agree with chest CT per primary team   Hx atrial fibrillation/atrial flutter   Patient with history of fib/flutter was initially noted to be in afib with RVR. Is now rate controlled, in normal sinus rhythm. Continue rate control with Propranolol, anticoagulation with Eliquis.   Per primary team:   Acute metabolic encephalopathy Essential tremor Hypothyroidism       For questions or updates, please contact [place] Please consult [email or web address] for contact info under        Signed, Lily Kocher, PA-C  07/25/2022, 7:38 AM    Patient examined chart reviewed Continues to have dementia with difficulty controlling airway high aspiration risk and mucous plugging. He has been at [facility] in [place] since July Don't see that he will be able to go home independently BNP not elevated good diuresis on oral demadex bid.  Follow Cr.  Inderal for PAF and tremor On DOAC will ask pharmacy to see regarding interaction of primidone with DOAC Given age and fall risk may need to adjust dose  Cardiology will sign off
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
## 2022-07-25 NOTE — Progress Notes (Addendum)
PROGRESS NOTE    Billy Smith  OJJ:[id number] DOB: 06-29-1933 DOA: 07/19/2022 PCP: Paulina Fusi, MD   Brief Narrative: 88 year old with past medical history significant for chronic diastolic heart failure, history of COPD not on oxygen at home, CAD status post CABG, A-flutter on Eliquis prior hospitalization September for acute respiratory failure with hypercapnia in the setting of aspiration pneumonia.  Prior to that hospitalization he was hospitalized at [facility] for pneumonia as well.  He presented with worsening cough over the last 2 days, worsening hypoxemia.  Evaluation in the ED chest x-ray raise concern for pneumonia.  Patient admitted for pneumonia.   Assessment & Plan:   Principal Problem:   HCAP (healthcare-associated pneumonia) Active Problems:   Acute metabolic encephalopathy   Atrial flutter (HCC)   COPD (chronic obstructive pulmonary disease) (HCC)   Coronary artery disease   Acute respiratory failure with hypoxia (HCC)   Chronic diastolic CHF (congestive heart failure) (HCC)   1-Acute  Hypoxic Respiratory Failure associated with pneumonia and acute COPD exacerbation: -MRSA PCR psotive.  -Continue with  Vancomycin total 7 days. Completed Azithro. Change Cefepime to Ceftriaxone to prevent encephalopathy/. Day 5/7 -Continue with Solumedrol IV -Continue with Nebulizer treatment.  -Continue with guaifenesin,  brovana.  -Evaluated by speech 12/01, concern for aspiration. -NPO -Speech evaluation 12/02: started on  regular, Nectar Thick liquid.  -Flutter valve.  -12/04: he became more hypoxic, had a lot of secretions suction by RT and RN at bedside. Oxygen requirement increased to 6 L, subsequently down to 3 L.  -Plan to proceed with CT chest further evaluate.  -Had MBS: " Pt presents with relatively strong swallow function with normal oral phase, delay of swallow onset with liquids at level of pyriforms, and occasional trace penetration of thin liquids that coats  the inferior surface of the epiglottis but was never viewed to reach the vocal folds. There was no aspiration despite pt being challenged with large, sequential thin liquid boluses. "  -Chest PT vest  -Send sputum culture.   Acute metabolic encephalopathy: -In setting of infection, acute illness.  -Support care.  -ABG PH compensated.  Taper down oxygen, Oxygen sat goal 90--% Change cefepime to ceftriaxone to avoid encephalopathy.  Improving.   AKI: Creatinine 1.0 Presented  with a creatinine 1.5 Continue to monitor on lasix.   History of a flutter/A-fib: -Continue with Eliquis  CAD status post CABG: -Continue with Lipitor.   Acute on Chronic diastolic heart failure: He recently had increase torsemide dose to 60 in am and 40 at hs. He was previously on 40 Mg BID>  Cardiology have been consulted.  Treated with  80 mg IV BID. 12/03 Daily weight. 227---222--225--223  Weight on discharge last hospitalization 224. Negative 4L Plan to transition to Torsemide 12/05  Goals of Care:  DNR/DNI appropriately.  Palliative care consulted.  Daughter wants to try to get him to his prior level of health.   Essential tremor; Continue with Propanolol and and Primidone.  Hypothyroidism; Continue with Synthroid.   Sleep Apnea; CPAP ordered.      Estimated body mass index is 37.27 kg/m as calculated from the following:   Height as of this encounter: 5\' 5"  (1.651 m).   Weight as of this encounter: 101.6 kg.   DVT prophylaxis: Eliquis Code Status: DNR Family Communication: Care discussed with Daughter  Disposition Plan:  Status is: Inpatient Remains inpatient appropriate because: Management resp failure. SNF in 24--48 hours.     Consultants:  Cardiology   Procedures:  Antimicrobials:    Subjective: Yesterday he had episode of hypoxia, RT suction large amount of phlegm, secretions.  He sounds better today, he feels better than yesterday., less confusion.      Objective: Vitals:   07/24/22 1953 07/24/22 2000 07/25/22 0041 07/25/22 0413  BP:  (!) 152/74 115/67 115/72  Pulse:  63 (!) 57 63  Resp: 18 18 18 18   Temp:  98.4 F (36.9 C) 97.9 F (36.6 C) 97.9 F (36.6 C)  TempSrc:  Oral Axillary Axillary  SpO2:  98% 98% 98%  Weight:      Height:        Intake/Output Summary (Last 24 hours) at 07/25/2022 0816 Last data filed at 07/25/2022 0157 Gross per 24 hour  Intake 500 ml  Output 2250 ml  Net -1750 ml    Filed Weights   07/23/22 0600 07/24/22 0625 07/24/22 0907  Weight: 102.5 kg 105.1 kg 101.6 kg    Examination:  General exam: NAD Respiratory system: Less wheezing,  Cardiovascular system: S,1  S 2 RRR Gastrointestinal system: BS present, soft nt Central nervous system: Alert Extremities: trace edema   Data Reviewed: I have personally reviewed following labs and imaging studies  CBC: Recent Labs  Lab 07/19/22 2129 07/19/22 2327 07/21/22 0350 07/22/22 0753 07/23/22 0012 07/24/22 0032 07/25/22 0016  WBC 9.7   < > 7.9 8.9 9.1 8.3 7.7  NEUTROABS 7.0  --   --   --   --   --   --   HGB 13.8   < > 11.7* 12.4* 11.1* 11.7* 11.9*  HCT 40.5   < > 34.6* 37.6* 32.7* 35.4* 34.0*  MCV 105.7*   < > 107.1* 107.7* 104.8* 105.7* 102.4*  PLT 191   < > 143* 186 172 162 177   < > = values in this interval not displayed.    Basic Metabolic Panel: Recent Labs  Lab 07/21/22 0350 07/22/22 0753 07/23/22 0012 07/24/22 0032 07/25/22 0016  NA 143 143 142 135 138  K 4.5 4.3 4.2 3.9 3.9  CL 98 104 102 100 100  CO2 31 28 32 26 30  GLUCOSE 133* 104* 103* 103* 127*  BUN 38* 39* 39* 33* 33*  CREATININE 1.28* 1.25* 1.25* 1.30* 0.95  CALCIUM 9.1 9.1 8.9 8.5* 8.9    GFR: Estimated Creatinine Clearance: 60.1 mL/min (by C-G formula based on SCr of 0.95 mg/dL). Liver Function Tests: Recent Labs  Lab 07/19/22 2129  AST 32  ALT 23  ALKPHOS 79  BILITOT 0.6  PROT 7.2  ALBUMIN 3.5    No results for input(s): "LIPASE",  "AMYLASE" in the last 168 hours. No results for input(s): "AMMONIA" in the last 168 hours. Coagulation Profile: No results for input(s): "INR", "PROTIME" in the last 168 hours. Cardiac Enzymes: No results for input(s): "CKTOTAL", "CKMB", "CKMBINDEX", "TROPONINI" in the last 168 hours. BNP (last 3 results) No results for input(s): "PROBNP" in the last 8760 hours. HbA1C: No results for input(s): "HGBA1C" in the last 72 hours.  CBG: Recent Labs  Lab 07/24/22 0623 07/24/22 1110 07/24/22 1745 07/24/22 2124 07/25/22 0647  GLUCAP 126* 95 83 88 115*    Lipid Profile: No results for input(s): "CHOL", "HDL", "LDLCALC", "TRIG", "CHOLHDL", "LDLDIRECT" in the last 72 hours. Thyroid Function Tests: No results for input(s): "TSH", "T4TOTAL", "FREET4", "T3FREE", "THYROIDAB" in the last 72 hours. Anemia Panel: No results for input(s): "VITAMINB12", "FOLATE", "FERRITIN", "TIBC", "IRON", "RETICCTPCT" in the last 72 hours. Sepsis Labs: Recent Labs  Lab  07/20/22 0046 07/20/22 0214 07/21/22 0350  PROCALCITON  --  <0.10 <0.10  LATICACIDVEN 1.3  --   --      Recent Results (from the past 240 hour(s))  Resp Panel by RT-PCR (Flu A&B, Covid) Anterior Nasal Swab     Status: None   Collection Time: 07/19/22  9:29 PM   Specimen: Anterior Nasal Swab  Result Value Ref Range Status   SARS Coronavirus 2 by RT PCR NEGATIVE NEGATIVE Final    Comment: (NOTE) SARS-CoV-2 target nucleic acids are NOT DETECTED.  The SARS-CoV-2 RNA is generally detectable in upper respiratory specimens during the acute phase of infection. The lowest concentration of SARS-CoV-2 viral copies this assay can detect is 138 copies/mL. A negative result does not preclude SARS-Cov-2 infection and should not be used as the sole basis for treatment or other patient management decisions. A negative result may occur with  improper specimen collection/handling, submission of specimen other than nasopharyngeal swab, presence of viral  mutation(s) within the areas targeted by this assay, and inadequate number of viral copies(<138 copies/mL). A negative result must be combined with clinical observations, patient history, and epidemiological information. The expected result is Negative.  Fact Sheet for Patients:  BloggerCourse.comhttps://www.fda.gov/media/152166/download  Fact Sheet for Healthcare Providers:  SeriousBroker.ithttps://www.fda.gov/media/152162/download  This test is no t yet approved or cleared by the [place] FDA and  has been authorized for detection and/or diagnosis of SARS-CoV-2 by FDA under an Emergency Use Authorization (EUA). This EUA will remain  in effect (meaning this test can be used) for the duration of the COVID-19 declaration under Section 564(b)(1) of the Act, 21 U.S.C.section 360bbb-3(b)(1), unless the authorization is terminated  or revoked sooner.       Influenza A by PCR NEGATIVE NEGATIVE Final   Influenza B by PCR NEGATIVE NEGATIVE Final    Comment: (NOTE) The Xpert Xpress SARS-CoV-2/FLU/RSV plus assay is intended as an aid in the diagnosis of influenza from Nasopharyngeal swab specimens and should not be used as a sole basis for treatment. Nasal washings and aspirates are unacceptable for Xpert Xpress SARS-CoV-2/FLU/RSV testing.  Fact Sheet for Patients: BloggerCourse.comhttps://www.fda.gov/media/152166/download  Fact Sheet for Healthcare Providers: SeriousBroker.ithttps://www.fda.gov/media/152162/download  This test is not yet approved or cleared by the [place] FDA and has been authorized for detection and/or diagnosis of SARS-CoV-2 by FDA under an Emergency Use Authorization (EUA). This EUA will remain in effect (meaning this test can be used) for the duration of the COVID-19 declaration under Section 564(b)(1) of the Act, 21 U.S.C. section 360bbb-3(b)(1), unless the authorization is terminated or revoked.  Performed at [facility], [address] [place], [place] [address]   Urine Culture     Status: None    Collection Time: 07/19/22 10:31 PM   Specimen: Urine, Clean Catch  Result Value Ref Range Status   Specimen Description URINE, CLEAN CATCH  Final   Special Requests NONE  Final   Culture   Final    NO GROWTH Performed at [facility], [address] [place], [place] [address]    Report Status 07/21/2022 FINAL  Final  Blood culture (routine x 2)     Status: None (Preliminary result)   Collection Time: 07/19/22 10:36 PM   Specimen: BLOOD  Result Value Ref Range Status   Specimen Description BLOOD LEFT ANTECUBITAL  Final   Special Requests   Final    BOTTLES DRAWN AEROBIC AND ANAEROBIC Blood Culture adequate volume   Culture   Final  NO GROWTH 4 DAYS Performed at [facility], [place] [address] [place], [place] [address]    Report Status PENDING  Incomplete  Blood culture (routine x 2)     Status: None (Preliminary result)   Collection Time: 07/19/22 11:18 PM   Specimen: BLOOD LEFT FOREARM  Result Value Ref Range Status   Specimen Description BLOOD LEFT FOREARM  Final   Special Requests   Final    BOTTLES DRAWN AEROBIC AND ANAEROBIC Blood Culture results may not be optimal due to an excessive volume of blood received in culture bottles   Culture   Final    NO GROWTH 4 DAYS Performed at [facility], [place] [address], [place], [place] [address]    Report Status PENDING  Incomplete  MRSA Next Gen by PCR, Nasal     Status: Abnormal   Collection Time: 07/20/22  4:01 AM   Specimen: Nasal Mucosa; Nasal Swab  Result Value Ref Range Status   MRSA by PCR Next Gen DETECTED (A) NOT DETECTED Final    Comment: CRITICAL RESULT CALLED TO, READ BACK BY AND VERIFIED WITH: BRIANA LEAKE RN ON 11.30.23 AT 0818 BY EM (NOTE) The GeneXpert MRSA Assay (FDA approved for NASAL specimens only), is one component of a comprehensive MRSA colonization surveillance program. It is not intended to diagnose MRSA infection nor to guide or monitor treatment for MRSA infections. Test  performance is not FDA approved in patients less than 39 years old. Performed at [facility], [place] [address] [place], [place] [address]          Radiology Studies: DG CHEST PORT 1 VIEW  Result Date: 07/24/2022 CLINICAL DATA:  Shortness of breath and hypoxemia. EXAM: PORTABLE CHEST 1 VIEW COMPARISON:  Chest radiograph dated 06/23/2022. FINDINGS: Left lung base atelectasis/scarring versus infiltrate. The right lung is clear. There is no pleural effusion or pneumothorax. Stable cardiomediastinal silhouette. Median sternotomy wires and CABG vascular clips. Atherosclerotic calcification of the aorta. No acute osseous pathology. IMPRESSION: Left lung base atelectasis/scarring versus infiltrate. Electronically Signed   By: Anner Crete M.D.   On: 07/24/2022 17:11        Scheduled Meds:  apixaban  5 mg Oral BID   arformoterol  15 mcg Nebulization BID   atorvastatin  40 mg Oral QHS   bimatoprost  1 drop Both Eyes QHS   brimonidine  1 drop Both Eyes BID   budesonide  2 mL Inhalation BID   Chlorhexidine Gluconate Cloth  6 each Topical Q0600   dorzolamide-timolol  1 drop Right Eye BID   guaiFENesin  1,200 mg Oral BID   insulin aspart  0-15 Units Subcutaneous TID WC   ipratropium-albuterol  3 mL Inhalation TID   levothyroxine  125 mcg Oral Q0600   methylPREDNISolone (SOLU-MEDROL) injection  40 mg Intravenous BID   mupirocin ointment  1 Application Nasal BID   primidone  125 mg Oral q1800   primidone  250 mg Oral q morning   propranolol  20 mg Oral BID   senna-docusate  1 tablet Oral Daily   torsemide  40 mg Oral BID   Continuous Infusions:  cefTRIAXone (ROCEPHIN)  IV Stopped (07/24/22 1044)   metronidazole 500 mg (07/25/22 0621)     LOS: 5 days    Time spent: 35 minutes    Piera Downs A Jaylena Holloway, MD [facility]   If 7PM-7AM, please contact night-coverage [email or web address]  07/25/2022, 8:16 AM

## 2022-07-25 NOTE — Progress Notes (Addendum)
Patient refused CPT. Daughter stated it was too late to be done today, requested it be done earlier in the evening from now on.   Next CPT to be done in the AM per daughter.

## 2022-07-25 NOTE — Care Management Important Message (Signed)
Important Message  Patient Details  Name: Billy Smith MRN: [id number] Date of Birth: Oct 23, 1932   Medicare Important Message Given:  Yes     Dorena Bodo 07/25/2022, 2:37 PM

## 2022-07-25 NOTE — Progress Notes (Addendum)
Modified Barium Swallow Progress Note  Patient Details  Name: Billy Smith MRN: [id number] Date of Birth: 12-24-1932  Today's Date: 07/25/2022  Modified Barium Swallow completed.  Full report located under Chart Review in the Imaging Section.  Brief recommendations include the following:  Clinical Impression  Pt presents with relatively strong swallow function with normal oral phase, delay of swallow onset with liquids at level of pyriforms, and occasional trace penetration of thin liquids that coats the inferior surface of the epiglottis but was never viewed to reach the vocal folds (PAS 3). There was no aspiration despite pt being challenged with large, sequential thin liquid boluses.  Purees/regular solids transitioned through the pharynx with mild vallecular residue that cleared with spontaneous sub-swallow. Presence of anterior osteophytes (not confirmed by MD) did not appear to impact function. He swallowed a 13 mm barium tablet with thin liquid and demonstrated immediate transit through the pharynx and UES with no aspiration.  Video was viewed in real time by daughter, Billy Smith, who expressed relief that her father is swallowing so well. We discussed that his function may wax and wane depending on overall medical condition, but that he should be ready to advance back to thin liquids. She prefers to continue nectars while hospitalized as an extra layer of safety and to advance back to thins once he can reliably eat his meals upright in a chair. For now, continue regular solids and nectar thick liquids. SLP will follow. Results/plans conveyed to Billy Smith and Billy Smith.   Swallow Evaluation Recommendations       SLP Diet Recommendations: Regular solids;Nectar thick liquid   Liquid Administration via: Straw   Medication Administration: Whole meds with liquid   Supervision: Patient able to self feed;Staff to assist with self feeding   Compensations: Minimize environmental distractions;Slow  rate;Small sips/bites       Oral Care Recommendations: Oral care BID       Rocky Gladden L. Samson Frederic, MA CCC/SLP Clinical Specialist - Acute Care SLP Acute Rehabilitation Services Office number [phone]  Blenda Mounts Laurice 07/25/2022,11:25 AM

## 2022-07-25 NOTE — Consult Note (Signed)
   [facility] Inpatient Consult   07/25/2022  Billy Smith [birth year] [id number]  [facility] [[facility]]  Accountable Care Organization [ACO] Patient: [employer]  Primary Care Provider:  Paulina Fusi, MD [facility]   Patient screened on rounds for hospitalization  to assess for potential [facility]  [[facility]] Care Management service needs for post hospital transition for care coordination.  Review of patient's electronic medical record reveals patient is from [facility] LTC SNF.   Plan:  No [facility] follow up planned at facility as patient is LTC.  Of note, [facility] Care Management/Population Health does not replace or interfere with any arrangements made by the Inpatient Transition of Care team.  For questions contact:   Charlesetta Shanks, RN BSN CCM [facility]  [phone] business mobile phone Toll free office [phone]  *Concierge Line  [phone] Fax number: [phone] [email or web address]

## 2022-07-25 NOTE — Progress Notes (Signed)
Physical Therapy Treatment Patient Details Name: Billy Smith MRN: [id number] DOB: 24-Feb-1933 Today's Date: 07/25/2022   History of Present Illness Pt is an 88 y.o. male admitted from SNF on 07/19/22 with cough, SOB. CXR concerning for L PNA. PMH includes CHF, COPD, CAD s/p CABG, aflutter on Eliquis; prior hospitalization 04/2022 for hypercapnia, aspiration PNA.   PT Comments    Pt progressing with mobility. Today's session focused on transfer training with RW and initiating ambulation; pt tolerated multiple sit<>stand and walking 8' with RW and modA+1-2. Pt requires max encouragement to mobilize, pt responds well to daughter pushing him. Pt remains limited by generalized weakness, decreased activity tolerance, and impaired balance strategies/postural reactions. Continue to recommend SNF-level therapies to maximize functional mobility and independence prior to return home.    Recommendations for follow up therapy are one component of a multi-disciplinary discharge planning process, led by the attending physician.  Recommendations may be updated based on patient status, additional functional criteria and insurance authorization.  Follow Up Recommendations  Skilled nursing-[facility] term rehab (<3 hours/day) Can patient physically be transported by private vehicle: No   Assistance Recommended at Discharge Frequent or constant Supervision/Assistance  Patient can return home with the following Two people to help with bathing/dressing/bathroom;Two people to help with walking and/or transfers;Help with stairs or ramp for entrance;Direct supervision/assist for medications management;Assist for transportation;Direct supervision/assist for financial management;Assistance with cooking/housework   Equipment Recommendations  None recommended by PT    Recommendations for Other Services  Mobility Specialist     Precautions / Restrictions Precautions Precautions: Fall;Other (comment) Precaution Comments:  Watch SpO2; incontinence; tremors baseline Restrictions Weight Bearing Restrictions: No     Mobility  Bed Mobility               General bed mobility comments: received sitting in recliner    Transfers Overall transfer level: Needs assistance Equipment used: Rolling walker (2 wheels) Transfers: Sit to/from Stand Sit to Stand: Mod assist, Min assist, +2 safety/equipment           General transfer comment: multiple sit<>stands from recliner to RW during session (4-5x); pt requiring initial heavy modA for first 2x trials, later able to power up with use of momentum and minA for subsequent trials; fluctuating between min-modA for trunk elevation and stability transitioning UE support to RW; initial posterior lean and tremulous requiring assist to prevent LOB    Ambulation/Gait Ambulation/Gait assistance: Mod assist, +2 safety/equipment Gait Distance (Feet): 8 Feet Assistive device: Rolling walker (2 wheels) Gait Pattern/deviations: Step-to pattern, Shuffle, Trunk flexed, Leaning posteriorly Gait velocity: Decreased     General Gait Details: slow, tremulous, fatigued gait with RW and modA (+2 lines/chair follow); multimodal cues for sequencing steps, pt with increased forward flexed posture and posterior lean with fatigue, close chair follow due to various posterior LOB requiring assist to correct; quick to fatigue needing seated rest break   Stairs             Wheelchair Mobility    Modified Rankin (Stroke Patients Only)       Balance Overall balance assessment: Needs assistance Sitting-balance support: Feet supported, Bilateral upper extremity supported Sitting balance-Leahy Scale: Poor Sitting balance - Comments: reliant on UE support for static sitting edge of chair   Standing balance support: Bilateral upper extremity supported, During functional activity, Reliant on assistive device for balance Standing balance-Leahy Scale: Poor Standing balance  comment: reliant on UE support and external assist  Cognition Arousal/Alertness: Awake/alert, Lethargic Behavior During Therapy: Flat affect Overall Cognitive Status: Impaired/Different from baseline                                 General Comments: pt's daughter present, pt responding better to her encouragement/cues. apparent slowed processing, follows majority of simple commands; difficult to determine true cognitive impairment vs fatigue vs desire vs [facility]        Exercises Other Exercises Other Exercises: 3x repeated sit<>stand (heavy reliance on BUE support)    General Comments General comments (skin integrity, edema, etc.): pt's daughter [name]) present and supportive, able to give pt 'tough love' which he seems to respond well to. VSS on supplemental O2. cues for coughing and percussion given to back to encourage productive cough since sputum sample needed and pt received chest PT earlier; minimally productive cough during session      Pertinent Vitals/Pain Pain Assessment Pain Assessment: No/denies pain Pain Intervention(s): Monitored during session    Home Living                          Prior Function            PT Goals (current goals can now be found in the care plan section) Acute Rehab PT Goals Patient Stated Goal: home for Christmas with daughter PT Goal Formulation: With family Progress towards PT goals: Progressing toward goals    Frequency    Min 2X/week      PT Plan Current plan remains appropriate    Co-evaluation              AM-PAC PT "6 Clicks" Mobility   Outcome Measure  Help needed turning from your back to your side while in a flat bed without using bedrails?: A Lot Help needed moving from lying on your back to sitting on the side of a flat bed without using bedrails?: A Lot Help needed moving to and from a bed to a chair (including a wheelchair)?: A Lot Help needed  standing up from a chair using your arms (e.g., wheelchair or bedside chair)?: A Lot Help needed to walk in hospital room?: Total Help needed climbing 3-5 steps with a railing? : Total 6 Click Score: 10    End of Session Equipment Utilized During Treatment: Gait belt;Oxygen Activity Tolerance: Patient tolerated treatment well Patient left: in chair;with chair alarm set;with call bell/phone within reach;with family/visitor present Nurse Communication: Mobility status PT Visit Diagnosis: Muscle weakness (generalized) (M62.81);Difficulty in walking, not elsewhere classified (R26.2);Unsteadiness on feet (R26.81)     Time: [id number] PT Time Calculation (min) (ACUTE ONLY): 25 min  Charges:  $Therapeutic Activity: 23-37 mins                      Billy Smith, PT, DPT Acute Rehabilitation Services  Personal: [facility] Office: [place] 07/25/2022, 5:24 PM

## 2022-07-26 DIAGNOSIS — J189 Pneumonia, unspecified organism: Secondary | ICD-10-CM | POA: Diagnosis not present

## 2022-07-26 LAB — BASIC METABOLIC PANEL
Anion gap: 9 (ref 5–15)
BUN: 35 mg/dL — ABNORMAL HIGH (ref 8–23)
CO2: 33 mmol/L — ABNORMAL HIGH (ref 22–32)
Calcium: 8.6 mg/dL — ABNORMAL LOW (ref 8.9–10.3)
Chloride: 93 mmol/L — ABNORMAL LOW (ref 98–111)
Creatinine, Ser: 1.01 mg/dL (ref 0.61–1.24)
GFR, Estimated: >60 mL/min (ref >60)
Glucose, Bld: 120 mg/dL — ABNORMAL HIGH (ref 70–99)
Potassium: 4.3 mmol/L (ref 3.5–5.1)
Sodium: 135 mmol/L (ref 135–145)

## 2022-07-26 LAB — CBC
HCT: 33.7 % — ABNORMAL LOW (ref 39.0–52.0)
Hemoglobin: 11.6 g/dL — ABNORMAL LOW (ref 13.0–17.0)
MCH: 35.3 pg — ABNORMAL HIGH (ref 26.0–34.0)
MCHC: 34.4 g/dL (ref 30.0–36.0)
MCV: 102.4 fL — ABNORMAL HIGH (ref 80.0–100.0)
Platelets: 187 10*3/uL (ref 150–400)
RBC: 3.29 MIL/uL — ABNORMAL LOW (ref 4.22–5.81)
RDW: 13.2 % (ref 11.5–15.5)
WBC: 8.3 10*3/uL (ref 4.0–10.5)
nRBC: 0 % (ref 0.0–0.2)

## 2022-07-26 LAB — GLUCOSE, CAPILLARY
Glucose-Capillary: 95 mg/dL (ref 70–99)
Glucose-Capillary: 145 mg/dL — ABNORMAL HIGH (ref 70–99)
Glucose-Capillary: 106 mg/dL — ABNORMAL HIGH (ref 70–99)

## 2022-07-26 MED ORDER — POLYETHYLENE GLYCOL 3350 17 G PO PACK
17.0000 g | PACK | Freq: Two times a day (BID) | ORAL | Status: DC
  Administered 2022-07-26 – 2022-07-27 (×2): 17 g via ORAL
  Filled 2022-07-26 (×3): qty 1

## 2022-07-26 MED ORDER — GLYCERIN (LAXATIVE) 2 G RE SUPP: 1.0000 | Freq: Every day | RECTAL | Status: DC | PRN

## 2022-07-26 MED ORDER — SENNOSIDES-DOCUSATE SODIUM 8.6-50 MG PO TABS
2.0000 | ORAL_TABLET | Freq: Every day | ORAL | Status: DC
  Administered 2022-07-27: 2 via ORAL
  Filled 2022-07-26: qty 2

## 2022-07-26 NOTE — TOC Progression Note (Signed)
Transition of Care [facility]) - Progression Note    Patient Details  Name: Billy Smith MRN: [id number] Date of Birth: 18-Feb-1933  Transition [facility]) CM/SW Contact  Carley Hammed, [place] Phone Number: 07/26/2022, 11:43 AM  Clinical Narrative:     CSW advised the plan is for pt to return to [facility] and will need an authorization at DC. Per MD note, pt will DC in 1-2 days. CSW contacted Health team and started authorization for SNF and PTAR. TOC will continue to follow for DC needs.  Expected Discharge Plan: Long Term Nursing Home Barriers to Discharge: Continued Medical Work up  Expected Discharge Plan and Services Expected Discharge Plan: Long Term Nursing Home       Living arrangements for the past 2 months: Skilled Nursing Facility                                       Social Determinants of Health (SDOH) Interventions Food Insecurity Interventions: Intervention Not Indicated Housing Interventions: Intervention Not Indicated Transportation Interventions: Intervention Not Indicated Utilities Interventions: Intervention Not Indicated  Readmission Risk Interventions     No data to display

## 2022-07-26 NOTE — Progress Notes (Signed)
RT NOTE: CPT held at this time per patient's daughter's request. She wants to wait until later for patient's CPT. RT will attempt CPT at next scheduled time.

## 2022-07-26 NOTE — Progress Notes (Signed)
Pt refused the 1200 blood sugar check.

## 2022-07-26 NOTE — Progress Notes (Signed)
Occupational Therapy Treatment Patient Details Name: Billy Smith MRN: [id number] DOB: April 21, 1933 Today's Date: 07/26/2022   History of present illness Pt is an 88 y.o. male admitted from SNF on 07/19/22 with cough, SOB. CXR concerning for L PNA. PMH includes CHF, COPD, CAD s/p CABG, aflutter on Eliquis; prior hospitalization 04/2022 for hypercapnia, aspiration PNA.   OT comments  Billy Smith is making incremental progress. Pt reported need to have a BM upon arrival. He attempted to transfer 2x but unable to safely pivot with RW. Used steady with mod A to stand and pt required total A for hygiene in standing. Pt required simple step by step cues throughout and continuous redirection to task. OT to continue to follow acutely. POC remains appropriate.    Recommendations for follow up therapy are one component of a multi-disciplinary discharge planning process, led by the attending physician.  Recommendations may be updated based on patient status, additional functional criteria and insurance authorization.    Follow Up Recommendations  Skilled nursing-[facility] (<3 hours/day)     Assistance Recommended at Discharge Frequent or constant Supervision/Assistance  Patient can return home with the following  Two people to help with walking and/or transfers;A lot of help with bathing/dressing/bathroom;Assist for transportation;Direct supervision/assist for financial management;Assistance with cooking/housework;Help with stairs or ramp for entrance;Direct supervision/assist for medications management   Equipment Recommendations  Other (comment)       Precautions / Restrictions Precautions Precautions: Fall;Other (comment) Precaution Comments: Watch SpO2; incontinence; tremors baseline Restrictions Weight Bearing Restrictions: No       Mobility Bed Mobility               General bed mobility comments: OOB upon arrival    Transfers Overall transfer level: Needs assistance Equipment  used: Ambulation equipment used, Rolling walker (2 wheels) Transfers: Sit to/from Stand Sit to Stand: Mod assist           General transfer comment: multiple sit<>stands Transfer via Lift Equipment: Stedy   Balance Overall balance assessment: Needs assistance Sitting-balance support: Feet supported, Bilateral upper extremity supported Sitting balance-Leahy Scale: Poor     Standing balance support: Bilateral upper extremity supported, During functional activity, Reliant on assistive device for balance Standing balance-Leahy Scale: Poor                             ADL either performed or assessed with clinical judgement   ADL Overall ADL's : Needs assistance/impaired                         Toilet Transfer: Total assistance Toilet Transfer Details (indicate cue type and reason): requried [place] of steady. attempted stand pivot 2x, unable to safely complete Toileting- Clothing Manipulation and Hygiene: Total assistance Toileting - Clothing Manipulation Details (indicate cue type and reason): in standing     Functional mobility during ADLs: Moderate assistance (for sit<>stand) General ADL Comments: transferred pt to [facility] via steady    Extremity/Trunk Assessment Upper Extremity Assessment Upper Extremity Assessment: RUE deficits/detail;LUE deficits/detail RUE Deficits / Details: using functionally on RW and steady LUE Deficits / Details: using functionally on RW and steady   Lower Extremity Assessment Lower Extremity Assessment: Defer to PT evaluation        Vision   Vision Assessment?: No apparent visual deficits   Perception Perception Perception: Not tested   Praxis Praxis Praxis: Not tested    Cognition Arousal/Alertness: Awake/alert, Lethargic Behavior During  Therapy: Flat affect, Impulsive Overall Cognitive Status: Impaired/Different from baseline Area of Impairment: Attention, Following commands, Safety/judgement, Problem solving,  Awareness                   Current Attention Level: Selective Memory: Decreased recall of precautions, Decreased [place]-term memory Following Commands: Follows one step commands inconsistently Safety/Judgement: Decreased awareness of safety, Decreased awareness of deficits Awareness: Emergent Problem Solving: Slow processing, Requires verbal cues General Comments: followed simple commands, slow processing, limited insight              General Comments pleasant this session, attempted to have BM but was unsuccessful    Pertinent Vitals/ Pain       Pain Assessment Pain Assessment: Faces Faces Pain Scale: Hurts a little bit Pain Location: generalized with movement Pain Descriptors / Indicators: Discomfort Pain Intervention(s): Limited activity within patient's tolerance, Monitored during session         Frequency  Min 2X/week        Progress Toward Goals  OT Goals(current goals can now be found in the care plan section)  Progress towards OT goals: Progressing toward goals  Acute Rehab OT Goals Patient Stated Goal: to have BM OT Goal Formulation: With patient Time For Goal Achievement: 08/04/22 Potential to Achieve Goals: Fair ADL Goals Pt Will Perform Grooming: sitting;with set-up;with supervision Pt Will Perform Upper Body Bathing: with min guard assist;sitting Pt Will Perform Lower Body Bathing: with mod assist;sitting/lateral leans;sit to/from stand;with adaptive equipment Pt Will Perform Upper Body Dressing: with min guard assist;sitting;with adaptive equipment Pt Will Transfer to Toilet: with supervision;ambulating Pt/caregiver will Perform Home Exercise Program: Increased ROM;Increased strength;Both right and left upper extremity;With minimal assist;With Supervision  Plan Discharge plan remains appropriate       AM-PAC OT "6 Clicks" Daily Activity     Outcome Measure   Help from another person eating meals?: Total Help from another person taking  care of personal grooming?: A Lot Help from another person toileting, which includes using toliet, bedpan, or urinal?: Total Help from another person bathing (including washing, rinsing, drying)?: A Lot Help from another person to put on and taking off regular upper body clothing?: A Lot Help from another person to put on and taking off regular lower body clothing?: Total 6 Click Score: 9    End of Session Equipment Utilized During Treatment: Gait belt;Rolling walker (2 wheels)  OT Visit Diagnosis: Unsteadiness on feet (R26.81);Other symptoms and signs involving cognitive function;Pain;Feeding difficulties (R63.3);Muscle weakness (generalized) (M62.81) Pain - Right/Left: Right Pain - part of body: Arm   Activity Tolerance Patient tolerated treatment well   Patient Left in chair;with call bell/phone within reach   Nurse Communication Mobility status        Time: [id number] OT Time Calculation (min): 22 min  Charges: OT General Charges $OT Visit: 1 Visit OT Treatments $Therapeutic Activity: 8-22 mins    Elliot Cousin 07/26/2022, 3:50 PM

## 2022-07-26 NOTE — Progress Notes (Signed)
Palliative:  Chart reviewed. Palliative care following peripherally. Will be available as needed. Please call [phone] for acute palliative needs.   No charge  Yong Channel, NP Palliative Medicine Team Pager [phone] (Please see [email or web address] for schedule) Team Phone [phone]
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
## 2022-07-26 NOTE — Progress Notes (Signed)
RT NOTE: RT attempted CPT X 3. Patient was working with PT, then PT had bowel movement and was getting cleaned up. On 3rd attempt patient stated he did not want to have CPT at this time. Patient's dinner tray arrived and he said he wanted to eat and did not want to do CPT. VSS. RT will continue to monitor.

## 2022-07-26 NOTE — Progress Notes (Signed)
Patient refusing to go on CPAP for the second time tonight. Vitals stable on 3 Lpm [place]. RN aware.

## 2022-07-26 NOTE — Progress Notes (Signed)
Mobility Specialist Progress Note    07/26/22 1116  Mobility  Activity Ambulated with assistance in room  Level of Assistance Moderate assist, patient does 50-74%  Assistive Device Front wheel walker  Distance Ambulated (ft) 4 ft  Activity Response Tolerated fair  Mobility Referral Yes  $Mobility charge 1 Mobility   Pre-Mobility: 59 HR, 98% SpO2 Post-Mobility: 62 HR, 100% SpO2  Pt received in bed and agreeable to get to chair. Pt repeatedly shouting "NO" when encouraging pt and setting up to ambulate around the bed. Pt only agreeable to exit bed on the chair side. Left with call bell in reach and chair alarm on.   [place] Mobility Specialist  Please Contact via SecureChat or Rehab Office at [phone]

## 2022-07-27 ENCOUNTER — Inpatient Hospital Stay (HOSPITAL_COMMUNITY): Payer: PPO

## 2022-07-27 DIAGNOSIS — J189 Pneumonia, unspecified organism: Secondary | ICD-10-CM | POA: Diagnosis not present

## 2022-07-27 LAB — CBC
HCT: 36.6 % — ABNORMAL LOW (ref 39.0–52.0)
Hemoglobin: 12.8 g/dL — ABNORMAL LOW (ref 13.0–17.0)
MCH: 35.6 pg — ABNORMAL HIGH (ref 26.0–34.0)
MCHC: 35 g/dL (ref 30.0–36.0)
MCV: 101.7 fL — ABNORMAL HIGH (ref 80.0–100.0)
Platelets: 170 10*3/uL (ref 150–400)
RBC: 3.6 MIL/uL — ABNORMAL LOW (ref 4.22–5.81)
RDW: 13.1 % (ref 11.5–15.5)
WBC: 9.4 10*3/uL (ref 4.0–10.5)
nRBC: 0 % (ref 0.0–0.2)

## 2022-07-27 LAB — BASIC METABOLIC PANEL
Anion gap: 10 (ref 5–15)
BUN: 39 mg/dL — ABNORMAL HIGH (ref 8–23)
CO2: 31 mmol/L (ref 22–32)
Calcium: 8.7 mg/dL — ABNORMAL LOW (ref 8.9–10.3)
Chloride: 96 mmol/L — ABNORMAL LOW (ref 98–111)
Creatinine, Ser: 1.05 mg/dL (ref 0.61–1.24)
GFR, Estimated: >60 mL/min (ref >60)
Glucose, Bld: 118 mg/dL — ABNORMAL HIGH (ref 70–99)
Potassium: 4 mmol/L (ref 3.5–5.1)
Sodium: 137 mmol/L (ref 135–145)

## 2022-07-27 LAB — GLUCOSE, CAPILLARY
Glucose-Capillary: 103 mg/dL — ABNORMAL HIGH (ref 70–99)
Glucose-Capillary: 108 mg/dL — ABNORMAL HIGH (ref 70–99)
Glucose-Capillary: 125 mg/dL — ABNORMAL HIGH (ref 70–99)
Glucose-Capillary: 126 mg/dL — ABNORMAL HIGH (ref 70–99)
Glucose-Capillary: 118 mg/dL — ABNORMAL HIGH (ref 70–99)

## 2022-07-27 LAB — MAGNESIUM: Magnesium: 1.9 mg/dL (ref 1.7–2.4)

## 2022-07-27 MED ORDER — PREDNISONE 20 MG PO TABS
40.0000 mg | ORAL_TABLET | Freq: Every day | ORAL | Status: DC
  Administered 2022-07-28: 40 mg via ORAL
  Filled 2022-07-27: qty 2

## 2022-07-27 NOTE — Progress Notes (Addendum)
Sputum culture sent to lab. Lab called and stated the culture spilled in the tube system. MD notified and askedif culture was still needed. MD stated the patient had been on antibiotics for a week, cultures werent needed at this time.

## 2022-07-27 NOTE — Progress Notes (Signed)
PROGRESS NOTE  Billy Smith [id number] DOB: [birth year] DOA: 07/19/2022 PCP: Nicoletta Dress, MD   LOS: 7 days   Brief Narrative / Interim history: 88 year old with past medical history significant for chronic diastolic heart failure, history of COPD not on oxygen at home, CAD status post CABG, A-flutter on Eliquis prior hospitalization September for acute respiratory failure with hypercapnia in the setting of aspiration pneumonia. Prior to that hospitalization he was hospitalized at [facility] for pneumonia as well. He presented with worsening cough over the last 2 days, worsening hypoxemia. Evaluation in the ED chest x-ray raise concern for pneumonia. Patient admitted for pneumonia.   Subjective / 24h Interval events: Had a quiet night, no significant complaints this morning.  Doing well overall.  Daughter is at bedside  Assesement and Plan: Principal Problem:   HCAP (healthcare-associated pneumonia) Active Problems:   Acute metabolic encephalopathy   Atrial flutter (HCC)   COPD (chronic obstructive pulmonary disease) (HCC)   Coronary artery disease   Acute respiratory failure with hypoxia (HCC)   Chronic diastolic CHF (congestive heart failure) (HCC)  Principal problem Acute  Hypoxic Respiratory Failure associated with pneumonia and acute COPD exacerbation-MRSA PCR positive.  He has been placed on various antibiotics, received azithromycin for 6 days 11/29-12/4, cefepime for 1 day 11/30, ceftriaxone for 6 days 11/29-12/5, vancomycin for 5 days 11/30-12/4, and recently started on metronidazole on 12/4, today's day #3 and plan for at least 5 days given suspicion for silent aspiration.  Continue steroids, breathing treatments, flutter valve, incentive spirometry as well as chest PT.  Speech evaluated patient, currently on regular diet with nectar thick liquids.  Had an episode of aspiration on 12/4 likely due to mucous plugging.  Chest CT showed persistent small airway disease with  mucous plugging and left lower lobe airspace opacity, representing atelectasis versus infectious/inflammation.  Underlying endobronchial mass lesion could not fully be excluded, likely needs repeat imaging as an outpatient when he is better -Repeat chest x-ray this afternoon  Active problems Acute metabolic encephalopathy-In setting of infection, acute illness.  Supportive care.  He appears close to baseline   AKI -creatinine has returned to baseline   History of a flutter/A-fib-Continue with Eliquis   CAD status post CABG-Continue with Lipitor.    Acute on Chronic diastolic heart failure -He recently had increase torsemide dose to 60 in am and 40 at hs. He was previously on 40 Mg BID. Cardiology have been consulted, initially received IV diuresis with improvement, appears close to euvolemia now, continue torsemide   Goals of Care-DNR/DNI appropriately.  Palliative care consulted.    Essential tremor-continue with Propanolol and and Primidone.   Hypothyroidism-continue with Synthroid.    Sleep Apnea-CPAP ordered.     Scheduled Meds:  apixaban  5 mg Oral BID   arformoterol  15 mcg Nebulization BID   atorvastatin  40 mg Oral QHS   bimatoprost  1 drop Both Eyes QHS   brimonidine  1 drop Both Eyes BID   budesonide  2 mL Inhalation BID   dorzolamide-timolol  1 drop Right Eye BID   guaiFENesin  1,200 mg Oral BID   insulin aspart  0-15 Units Subcutaneous TID WC   ipratropium-albuterol  3 mL Inhalation TID   levothyroxine  125 mcg Oral Q0600   methylPREDNISolone (SOLU-MEDROL) injection  40 mg Intravenous BID   polyethylene glycol  17 g Oral BID   primidone  125 mg Oral q1800   primidone  250 mg Oral q morning   propranolol  20 mg Oral BID   senna-docusate  2 tablet Oral QHS   torsemide  20 mg Oral Once   torsemide  60 mg Oral BID   Continuous Infusions:  metronidazole 500 mg (07/27/22 0554)   PRN Meds:.acetaminophen **OR** acetaminophen, albuterol, dextromethorphan, food  thickener, Glycerin (Adult), ondansetron **OR** ondansetron (ZOFRAN) IV  Current Outpatient Medications  Medication Instructions   albuterol (VENTOLIN HFA) 108 (90 Base) MCG/ACT inhaler 2 puffs, Inhalation, Every 6 hours PRN   apixaban (ELIQUIS) 5 mg, Oral, 2 times daily   atorvastatin (LIPITOR) 40 mg, Oral, Daily at bedtime   bimatoprost (LUMIGAN) 0.01 % SOLN 1 drop, Both Eyes, Daily at bedtime   brimonidine (ALPHAGAN) 0.15 % ophthalmic solution 1 drop, Both Eyes, 2 times daily   budesonide (PULMICORT) 0.5 MG/2ML nebulizer solution 2 mLs, Inhalation, 2 times daily   calcium-vitamin D (OSCAL WITH D) 500-5 MG-MCG tablet 1 tablet, Oral, 2 times daily   Cholecalciferol 5,000 Units, Oral, Daily   dorzolamide-timolol (COSOPT) 22.3-6.8 MG/ML ophthalmic solution 1 drop, Right Eye, 2 times daily   empagliflozin (JARDIANCE) 10 mg, Oral, Daily   FOLTABS 800 800-10-115 MCG-MG-MCG TABS 1 tablet, Oral, Daily   guaiFENesin (ROBITUSSIN) 100 MG/5ML liquid 10 mLs, Oral, Every 6 hours PRN   Infant Care Products (DERMACLOUD) OINT 1 Application, Apply externally, See admin instructions, Apply to buttocks/groin topically every shift for redness. May leave in room for CNA to apply.   ipratropium-albuterol (DUONEB) 0.5-2.5 (3) MG/3ML SOLN 3 mLs, Inhalation, Every 8 hours   levothyroxine (SYNTHROID) 125 mcg, Oral, Daily   primidone (MYSOLINE) 250 mg, Oral, Every morning   Primidone 125 mg, Oral, Daily-1800   propranolol (INDERAL) 20 mg, Oral, 2 times daily   senna (SENOKOT) 8.6 MG tablet 2 tablets, Oral, Daily at bedtime   spironolactone (ALDACTONE) 12.5 mg, Oral, Daily   Torsemide 40 mg, Oral, 2 times daily   Torsemide 60 mg, Oral, Every morning    Diet Orders (From admission, onward)     Start     Ordered   07/22/22 1341  Diet regular Room service appropriate? No; Fluid consistency: Nectar Thick  Diet effective now       Comments: Meds whole with puree; daughter, [name], will order meals  Question Answer  Comment  Room service appropriate? No   Fluid consistency: Nectar Thick      07/22/22 1341            DVT prophylaxis:  apixaban (ELIQUIS) tablet 5 mg   Lab Results  Component Value Date   PLT 170 07/27/2022      Code Status: DNR  Family Communication: daughter at bedside   Status is: Inpatient  Remains inpatient appropriate because: antibiotics, hopefully SNF 1-2 days  Level of care: Telemetry Cardiac  Consultants:  Cardiology   Objective: Vitals:   07/27/22 0923 07/27/22 0928 07/27/22 0931 07/27/22 0942  BP:      Pulse: 62   60  Resp: 17   16  Temp:      TempSrc:      SpO2: 97% 97% 97% 97%  Weight:      Height:        Intake/Output Summary (Last 24 hours) at 07/27/2022 1111 Last data filed at 07/27/2022 0506 Gross per 24 hour  Intake 200.03 ml  Output 1200 ml  Net -999.97 ml    Wt Readings from Last 3 Encounters:  07/27/22 107 kg  05/09/22 102.2 kg  03/09/20 101.6 kg    Examination:  Constitutional: NAD  Eyes: lids and conjunctivae normal, no scleral icterus ENMT: mmm Neck: normal, supple Respiratory: clear to auscultation bilaterally, no wheezing, no crackles. Normal respiratory effort.  Cardiovascular: Regular rate and rhythm, no murmurs / rubs / gallops. No LE edema. Abdomen: soft, no distention, no tenderness. Bowel sounds positive.  Skin: no rashes  Data Reviewed: I have independently reviewed following labs and imaging studies   CBC Recent Labs  Lab 07/23/22 0012 07/24/22 0032 07/25/22 0016 07/26/22 0503 07/27/22 0030  WBC 9.1 8.3 7.7 8.3 9.4  HGB 11.1* 11.7* 11.9* 11.6* 12.8*  HCT 32.7* 35.4* 34.0* 33.7* 36.6*  PLT 172 162 177 187 170  MCV 104.8* 105.7* 102.4* 102.4* 101.7*  MCH 35.6* 34.9* 35.8* 35.3* 35.6*  MCHC 33.9 33.1 35.0 34.4 35.0  RDW 13.8 13.2 13.2 13.2 13.1     Recent Labs  Lab 07/21/22 0350 07/22/22 0753 07/23/22 0012 07/24/22 0032 07/25/22 0016 07/26/22 0503 07/27/22 0030  NA 143   < > 142 135 138  135 137  K 4.5   < > 4.2 3.9 3.9 4.3 4.0  CL 98   < > 102 100 100 93* 96*  CO2 31   < > 32 26 30 33* 31  GLUCOSE 133*   < > 103* 103* 127* 120* 118*  BUN 38*   < > 39* 33* 33* 35* 39*  CREATININE 1.28*   < > 1.25* 1.30* 0.95 1.01 1.05  CALCIUM 9.1   < > 8.9 8.5* 8.9 8.6* 8.7*  MG  --   --   --   --   --   --  1.9  PROCALCITON <0.10  --   --   --   --   --   --    < > = values in this interval not displayed.     ------------------------------------------------------------------------------------------------------------------ No results for input(s): "CHOL", "HDL", "LDLCALC", "TRIG", "CHOLHDL", "LDLDIRECT" in the last 72 hours.  Lab Results  Component Value Date   HGBA1C 5.9 (H) 07/20/2022   ------------------------------------------------------------------------------------------------------------------ No results for input(s): "TSH", "T4TOTAL", "T3FREE", "THYROIDAB" in the last 72 hours.  Invalid input(s): "FREET3"  Cardiac Enzymes No results for input(s): "CKMB", "TROPONINI", "MYOGLOBIN" in the last 168 hours.  Invalid input(s): "CK" ------------------------------------------------------------------------------------------------------------------    Component Value Date/Time   BNP 79.7 07/20/2022 0902    CBG: Recent Labs  Lab 07/26/22 0616 07/26/22 1159 07/26/22 1626 07/26/22 2058 07/27/22 0542  GLUCAP 95 145* 106* 103* 108*     Recent Results (from the past 240 hour(s))  Resp Panel by RT-PCR (Flu A&B, Covid) Anterior Nasal Swab     Status: None   Collection Time: 07/19/22  9:29 PM   Specimen: Anterior Nasal Swab  Result Value Ref Range Status   SARS Coronavirus 2 by RT PCR NEGATIVE NEGATIVE Final    Comment: (NOTE) SARS-CoV-2 target nucleic acids are NOT DETECTED.  The SARS-CoV-2 RNA is generally detectable in upper respiratory specimens during the acute phase of infection. The lowest concentration of SARS-CoV-2 viral copies this assay can detect is 138  copies/mL. A negative result does not preclude SARS-Cov-2 infection and should not be used as the sole basis for treatment or other patient management decisions. A negative result may occur with  improper specimen collection/handling, submission of specimen other than nasopharyngeal swab, presence of viral mutation(s) within the areas targeted by this assay, and inadequate number of viral copies(<138 copies/mL). A negative result must be combined with clinical observations, patient history, and epidemiological information. The expected result is  Negative.  Fact Sheet for Patients:  EntrepreneurPulse.com.au  Fact Sheet for Healthcare Providers:  IncredibleEmployment.be  This test is no t yet approved or cleared by the [place] FDA and  has been authorized for detection and/or diagnosis of SARS-CoV-2 by FDA under an Emergency Use Authorization (EUA). This EUA will remain  in effect (meaning this test can be used) for the duration of the COVID-19 declaration under Section 564(b)(1) of the Act, 21 U.S.C.section 360bbb-3(b)(1), unless the authorization is terminated  or revoked sooner.       Influenza A by PCR NEGATIVE NEGATIVE Final   Influenza B by PCR NEGATIVE NEGATIVE Final    Comment: (NOTE) The Xpert Xpress SARS-CoV-2/FLU/RSV plus assay is intended as an aid in the diagnosis of influenza from Nasopharyngeal swab specimens and should not be used as a sole basis for treatment. Nasal washings and aspirates are unacceptable for Xpert Xpress SARS-CoV-2/FLU/RSV testing.  Fact Sheet for Patients: EntrepreneurPulse.com.au  Fact Sheet for Healthcare Providers: IncredibleEmployment.be  This test is not yet approved or cleared by the [place] FDA and has been authorized for detection and/or diagnosis of SARS-CoV-2 by FDA under an Emergency Use Authorization (EUA). This EUA will remain in effect (meaning  this test can be used) for the duration of the COVID-19 declaration under Section 564(b)(1) of the Act, 21 U.S.C. section 360bbb-3(b)(1), unless the authorization is terminated or revoked.  Performed at [facility], [place] [address], [place], [place] [address]   Urine Culture     Status: None   Collection Time: 07/19/22 10:31 PM   Specimen: Urine, Clean Catch  Result Value Ref Range Status   Specimen Description URINE, CLEAN CATCH  Final   Special Requests NONE  Final   Culture   Final    NO GROWTH Performed at [facility], [place] [address], [place], [place] [address]    Report Status 07/21/2022 FINAL  Final  Blood culture (routine x 2)     Status: None   Collection Time: 07/19/22 10:36 PM   Specimen: BLOOD  Result Value Ref Range Status   Specimen Description BLOOD LEFT ANTECUBITAL  Final   Special Requests   Final    BOTTLES DRAWN AEROBIC AND ANAEROBIC Blood Culture adequate volume   Culture   Final    NO GROWTH 5 DAYS Performed at [facility], [place] [address] [place], [place] [address]    Report Status 07/25/2022 FINAL  Final  Blood culture (routine x 2)     Status: None   Collection Time: 07/19/22 11:18 PM   Specimen: BLOOD LEFT FOREARM  Result Value Ref Range Status   Specimen Description BLOOD LEFT FOREARM  Final   Special Requests   Final    BOTTLES DRAWN AEROBIC AND ANAEROBIC Blood Culture results may not be optimal due to an excessive volume of blood received in culture bottles   Culture   Final    NO GROWTH 5 DAYS Performed at [facility], [place] [address] [place], [place] [address]    Report Status 07/25/2022 FINAL  Final  MRSA Next Gen by PCR, Nasal     Status: Abnormal   Collection Time: 07/20/22  4:01 AM   Specimen: Nasal Mucosa; Nasal Swab  Result Value Ref Range Status   MRSA by PCR Next Gen DETECTED (A) NOT DETECTED Final    Comment: CRITICAL RESULT CALLED TO, READ BACK BY AND VERIFIED WITH: BRIANA LEAKE RN ON 11.30.23 AT 0818  BY  EM (NOTE) The GeneXpert MRSA Assay (FDA approved for NASAL specimens only), is one component of a comprehensive MRSA colonization surveillance program. It is not intended to diagnose MRSA infection nor to guide or monitor treatment for MRSA infections. Test performance is not FDA approved in patients less than 18 years old. Performed at [facility], [place] [address], [place], [place] [address]      Radiology Studies: No results found.   Marzetta Board, MD, PhD [facility]  Between 7 am - 7 pm I am available, please contact me via Amion (for emergencies) or Securechat (non urgent messages)  Between 7 pm - 7 am I am not available, please contact night coverage MD/APP via Amion

## 2022-07-27 NOTE — Progress Notes (Signed)
Physical Therapy Treatment Patient Details Name: Billy Smith MRN: [id number] DOB: 08/05/1933 Today's Date: 07/27/2022   History of Present Illness Pt is an 88 y.o. male admitted from SNF on 07/19/22 with cough, SOB. CXR concerning for L PNA. PMH includes CHF, COPD, CAD s/p CABG, aflutter on Eliquis; prior hospitalization 04/2022 for hypercapnia, aspiration PNA.    PT Comments    Pt easily fatigued, needing several minutes sitting to rest between bouts of mobility. Pt with productive cough throughout session. Pt requiring encouragement to continue to progress mobility and session as pt would state comments like "I just want to be in a box next to my wife". RN notified. Extended periods of time spent to ambulate a few ft with a RW and minA, perform x5 sit <> stand reps with min-modA, and perform x10 LAQs bil today. Will continue to follow acutely. Current recommendations remain appropriate.      Recommendations for follow up therapy are one component of a multi-disciplinary discharge planning process, led by the attending physician.  Recommendations may be updated based on patient status, additional functional criteria and insurance authorization.  Follow Up Recommendations  Skilled nursing-[facility] term rehab (<3 hours/day) Can patient physically be transported by private vehicle: Yes   Assistance Recommended at Discharge Frequent or constant Supervision/Assistance  Patient can return home with the following Two people to help with bathing/dressing/bathroom;Two people to help with walking and/or transfers;Help with stairs or ramp for entrance;Direct supervision/assist for medications management;Assist for transportation;Direct supervision/assist for financial management;Assistance with cooking/housework   Equipment Recommendations  None recommended by PT    Recommendations for Other Services       Precautions / Restrictions Precautions Precautions: Fall;Other (comment) Precaution  Comments: Watch SpO2; incontinence; tremors baseline Restrictions Weight Bearing Restrictions: No     Mobility  Bed Mobility               General bed mobility comments: OOB in chair upon arrival    Transfers Overall transfer level: Needs assistance Equipment used: Rolling walker (2 wheels) Transfers: Sit to/from Stand Sit to Stand: Min assist, Mod assist           General transfer comment: x5 sit <> stand reps from recliner with good push up on arm rests but needing cues to reach for arm rests to sit. ModA the first rep but minA the final x4 reps, cuing to extend knees and hips.    Ambulation/Gait Ambulation/Gait assistance: Min assist Gait Distance (Feet): 3 Feet Assistive device: Rolling walker (2 wheels) Gait Pattern/deviations: Step-through pattern, Decreased stride length, Knee flexed in stance - right, Knee flexed in stance - left, Trunk flexed Gait velocity: Decreased Gait velocity interpretation: <1.31 ft/sec, indicative of household ambulator   General Gait Details: Pt with slow, unsteady, small steps in room with chair follow. Pt bouncing at knees and getting lower as distance progressed, thus ceased further gait attempts for pt safety   Stairs             Wheelchair Mobility    Modified Rankin (Stroke Patients Only)       Balance Overall balance assessment: Needs assistance Sitting-balance support: Feet supported, Bilateral upper extremity supported Sitting balance-Leahy Scale: Poor Sitting balance - Comments: reliant on UE support for static sitting edge of chair   Standing balance support: Bilateral upper extremity supported, During functional activity, Reliant on assistive device for balance Standing balance-Leahy Scale: Poor Standing balance comment: reliant on UE support and external assist  Cognition Arousal/Alertness: Awake/alert, Lethargic Behavior During Therapy: Flat affect Overall  Cognitive Status: Impaired/Different from baseline                                 General Comments: Pt with very slow processing, repeating comments with tangential speech at times. Pt needing several minutes to initiate all tasks. Pt appeared to be a bit resistive to movement but did not verbally express it, just kept saying how he wanted to "be in a box next to my wife". RN notified        Exercises General Exercises - Lower Extremity Long Arc Quad: AROM, Strengthening, Both, 10 reps, Seated Other Exercises Other Exercises: sit <> stand 5x with min-modA    General Comments General comments (skin integrity, edema, etc.): pleth variable and unreliable, reading in 60s% one moment then 90s% another on supplemental O2 via [place]      Pertinent Vitals/Pain Pain Assessment Pain Assessment: Faces Faces Pain Scale: Hurts a little bit Pain Location: generalized with movement Pain Descriptors / Indicators: Discomfort Pain Intervention(s): Limited activity within patient's tolerance, Monitored during session, Repositioned    Home Living                          Prior Function            PT Goals (current goals can now be found in the care plan section) Acute Rehab PT Goals Patient Stated Goal: to be with his wife PT Goal Formulation: With patient Time For Goal Achievement: 08/03/22 Potential to Achieve Goals: Fair Progress towards PT goals: Progressing toward goals    Frequency    Min 2X/week      PT Plan Current plan remains appropriate    Co-evaluation              AM-PAC PT "6 Clicks" Mobility   Outcome Measure  Help needed turning from your back to your side while in a flat bed without using bedrails?: A Lot Help needed moving from lying on your back to sitting on the side of a flat bed without using bedrails?: A Lot Help needed moving to and from a bed to a chair (including a wheelchair)?: A Lot Help needed standing up from a chair using  your arms (e.g., wheelchair or bedside chair)?: A Lot Help needed to walk in hospital room?: Total Help needed climbing 3-5 steps with a railing? : Total 6 Click Score: 10    End of Session Equipment Utilized During Treatment: Gait belt;Oxygen Activity Tolerance: Patient limited by fatigue Patient left: in chair;with call bell/phone within reach Nurse Communication: Mobility status;Other (comment) (pt reporting desire to be with deceased wife) PT Visit Diagnosis: Muscle weakness (generalized) (M62.81);Difficulty in walking, not elsewhere classified (R26.2);Unsteadiness on feet (R26.81);Other abnormalities of gait and mobility (R26.89)     Time: 2122-4825 PT Time Calculation (min) (ACUTE ONLY): 42 min  Charges:  $Therapeutic Exercise: 8-22 mins $Therapeutic Activity: 23-37 mins                     Raymond Gurney, PT, DPT Acute Rehabilitation Services  Office: [phone]    Jewel Baize 07/27/2022, 4:26 PM

## 2022-07-27 NOTE — TOC Progression Note (Signed)
Transition of Care [facility]) - Progression Note    Patient Details  Name: LEOR WHYTE MRN: [id number] Date of Birth: [birth year]  Transition of [facility]) CM/SW Contact  Carley Hammed, [place] Phone Number: 07/27/2022, 11:43 AM  Clinical Narrative:    CSW notified by MD that pt will be ready to DC tomorrow. Facility agreeable to accepting pt as he is a LTC pt. [employer] authorization is still pending, but facility can take pt back without it resulting. Facility requested an early DC as their pharmacy is closing early, MD notified. CSW attempted to update dtr, unable to leave VM. TOC will continue to follow for DC needs.   Expected Discharge Plan: Long Term Nursing Home Barriers to Discharge: Continued Medical Work up  Expected Discharge Plan and Services Expected Discharge Plan: Long Term Nursing Home       Living arrangements for the past 2 months: Skilled Nursing Facility                                       Social Determinants of Health (SDOH) Interventions Food Insecurity Interventions: Intervention Not Indicated Housing Interventions: Intervention Not Indicated Transportation Interventions: Intervention Not Indicated Utilities Interventions: Intervention Not Indicated  Readmission Risk Interventions     No data to display

## 2022-07-27 NOTE — Progress Notes (Signed)
Mobility Specialist Progress Note    07/27/22 1000  Mobility  Activity Ambulated with assistance in room  Level of Assistance Moderate assist, patient does 50-74%  Assistive Device Front wheel walker  Distance Ambulated (ft) 12 ft  Activity Response Tolerated fair  Mobility Referral Yes  $Mobility charge 1 Mobility   Post-Mobility: 58 HR  Pt received in bed and agreeable to get to chair. Pt required more cueing for posture and proximity to RW as he fatigued. Left in chair with call bell in reach and NT and daughter present.   Billy Smith Mobility Specialist  Please Neurosurgeon or Rehab Office at [phone]

## 2022-07-27 NOTE — Progress Notes (Signed)
RT NOTE: CPT held at this time as patient eating breakfast. RT will attempt CPT at next scheduled time.

## 2022-07-27 NOTE — Consult Note (Signed)
   [facility] Inpatient Consult   07/27/2022  Dmani Mizer Kilfoyle Apr 07, 1933 [id number]  [facility] [[facility]]  Accountable Care Organization [ACO] Patient: [employer]  Primary Care Provider:  Paulina Fusi, MD, [facility]  Patient screened for hospitalization with noted high risk score for unplanned readmission risk and length of stay.  Patient's electronic medical record was briefly review to assess for potential [facility]  [[facility]] Care Management service needs for post hospital transition for care coordination.  Review of patient's electronic medical record reveals patient is planning to return to [facility] where is a long-term resident.   Plan:  No [facility] Community Care Coordination needs assessed at this time. Will sign off at transition.  Of note, [facility] Care Management/Population Health does not replace or interfere with any arrangements made by the Inpatient Transition of Care team.  For questions contact:   Charlesetta Shanks, RN BSN CCM [facility]  [phone] business mobile phone Toll free office [phone]  *Concierge Line  [phone] Fax number: [phone] [email or web address]

## 2022-07-28 DIAGNOSIS — Z515 Encounter for palliative care: Secondary | ICD-10-CM | POA: Diagnosis not present

## 2022-07-28 DIAGNOSIS — J189 Pneumonia, unspecified organism: Secondary | ICD-10-CM | POA: Diagnosis not present

## 2022-07-28 DIAGNOSIS — Z7189 Other specified counseling: Secondary | ICD-10-CM

## 2022-07-28 LAB — GLUCOSE, CAPILLARY
Glucose-Capillary: 106 mg/dL — ABNORMAL HIGH (ref 70–99)
Glucose-Capillary: 172 mg/dL — ABNORMAL HIGH (ref 70–99)

## 2022-07-28 MED ORDER — PROPRANOLOL HCL 20 MG PO TABS: 20.0000 mg | ORAL_TABLET | Freq: Three times a day (TID) | ORAL | 0 refills | Status: AC

## 2022-07-28 MED ORDER — PREDNISONE 20 MG PO TABS: ORAL_TABLET | ORAL | 0 refills | Status: AC

## 2022-07-28 MED ORDER — METRONIDAZOLE 500 MG PO TABS: 500.0000 mg | ORAL_TABLET | Freq: Two times a day (BID) | ORAL | 0 refills | Status: AC

## 2022-07-28 MED ORDER — TORSEMIDE 40 MG PO TABS: 40.0000 mg | ORAL_TABLET | Freq: Two times a day (BID) | ORAL | 0 refills | Status: AC

## 2022-07-28 NOTE — Progress Notes (Signed)
   Palliative Medicine Inpatient Follow Up Note HPI: 88 year old with past medical history significant for chronic diastolic heart failure, history of COPD not on oxygen at home, CAD status post CABG, A-flutter on Eliquis prior hospitalization September for acute respiratory failure with hypercapnia in the setting of aspiration pneumonia.    Palliative care is familiar with Billy Smith from his admission in September, we have been asked to re-engage for additional goals of care conversations.    Today's Discussion 07/28/2022  *Please note that this is a verbal dictation therefore any spelling or grammatical errors are due to the "[employer] One" system interpretation.  Chart reviewed inclusive of vital signs, progress notes, laboratory results, and diagnostic images.   I met with Billy Smith and his daughter, Billy Smith at bedside this morning. Billy Smith is eating a peanut butter and jelly and appears in good spirits. Patients daughter, Billy Smith expresses that he has been improving throughout the week and has diuresed well. She feels his breathing has improved quite a bit.  We completed a MOST form as below:  Cardiopulmonary Resuscitation: Do Not Attempt Resuscitation (DNR/No CPR)  Medical Interventions: Limited Additional Interventions: Use medical treatment, IV fluids and cardiac monitoring as indicated, DO NOT USE intubation or mechanical ventilation. May consider use of less invasive airway support such as BiPAP or CPAP. Also provide comfort measures. Transfer to the hospital if indicated. Avoid intensive care.   Antibiotics: Determine use of limitation of antibiotics when infection occurs  IV Fluids: IV fluids for a defined trial period  Feeding Tube: No feeding tube   Plan for transition to skilled nursing today.   Questions and concerns addressed/Palliative Support Provided.   Objective Assessment: Vital Signs Vitals:   07/28/22 0849 07/28/22 0852  BP:    Pulse:    Resp:    Temp:    SpO2: 95% 98%     Intake/Output Summary (Last 24 hours) at 07/28/2022 1018 Last data filed at 07/28/2022 0408 Gross per 24 hour  Intake 360 ml  Output 1450 ml  Net -1090 ml    Last Weight  Most recent update: 07/28/2022  6:33 AM    Weight  100.9 kg (222 lb 7.1 oz)            Gen: Elderly chronically ill appearing Caucasian male in mild distress HEENT: Dry mucous membranes CV: Regular rate and irregular rhythm PULM: On 2LPM [place], breathing is even and nonlabored ABD: soft/nontender EXT: (+) Generalized edema Neuro: Alert and oriented x2-3 -very hard of hearing  SUMMARY OF RECOMMENDATIONS   DNAR/DNI   MOST Completed, paper copy placed onto the chart electric copy can be found in [place]  DNR Form Completed, paper copy placed onto the chart electric copy can be found in [employer]  When insurance shifts in January patient's daughter is open to the idea of outpatient palliative support   Plan for discharge to [facility] today  Billing based on MDM: High _____________________________________________________________________________________ [place] Team Team Cell Phone: [phone] Please utilize secure chat with additional questions, if there is no response within 30 minutes please call the above phone number  Palliative Medicine Team providers are available by phone from 7am to 7pm daily and can be reached through the team cell phone.  Should this patient require assistance outside of these hours, please call the patient's attending physician.

## 2022-07-28 NOTE — Discharge Summary (Signed)
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 102.4* 102.4* 101.7*  PLT 172 162 177 187 170   CBG: Recent Labs  Lab 07/27/22 0542 07/27/22 1135 07/27/22 1616 07/27/22 2120 07/28/22 0629  GLUCAP 108* 125* 126* 118* 106*   Hgb A1c No results for input(s): "HGBA1C" in the last 72 hours. Lipid Profile No results for input(s): "CHOL", "HDL", "LDLCALC", "TRIG", "CHOLHDL", "LDLDIRECT" in the last 72 hours. Thyroid function studies No results for input(s): "TSH", "T4TOTAL", "T3FREE", "THYROIDAB" in the last 72 hours.  Invalid input(s): "FREET3" Urinalysis    Component Value Date/Time   COLORURINE YELLOW 07/19/2022 [place] 07/19/2022 2231   LABSPEC 1.011 07/19/2022 2231   PHURINE 5.0 07/19/2022 2231   GLUCOSEU 50 (A) 07/19/2022 2231   HGBUR NEGATIVE 07/19/2022 2231   BILIRUBINUR NEGATIVE 07/19/2022 2231   KETONESUR NEGATIVE 07/19/2022 2231   PROTEINUR NEGATIVE 07/19/2022 2231   NITRITE  NEGATIVE 07/19/2022 2231   LEUKOCYTESUR NEGATIVE 07/19/2022 2231    FURTHER DISCHARGE INSTRUCTIONS:   Get Medicines reviewed and adjusted: Please take all your medications with you for your next visit with your Primary  MD   Laboratory/radiological data: Please request your Primary MD to go over all hospital tests and procedure/radiological results at the follow up, please ask your Primary MD to get all Hospital records sent to his/her office.   In some cases, they will be blood work, cultures and biopsy results pending at the time of your discharge. Please request that your primary care M.D. goes through all the records of your hospital data and follows up on these results.   Also Note the following: If you experience worsening of your admission symptoms, develop shortness of breath, life threatening emergency, suicidal or homicidal thoughts you must seek medical attention immediately by calling 911 or calling your MD immediately  if symptoms less severe.   You must read complete instructions/literature along with all the possible adverse reactions/side effects for all the Medicines you take and that have been prescribed to you. Take any new Medicines after you have completely understood and accpet all the possible adverse reactions/side effects.    Do not drive when taking Pain medications or sleeping medications (Benzodaizepines)   Do not take more than prescribed Pain, Sleep and Anxiety Medications. It is not advisable to combine anxiety,sleep and pain medications without talking with your primary care practitioner   Special Instructions: If you have smoked or chewed Tobacco  in the last 2 yrs please stop smoking, stop any regular Alcohol  and or any Recreational drug use.   Wear Seat belts while driving.   Please note: You were cared for by a hospitalist during your hospital stay. Once you are discharged, your primary care physician will handle any further medical issues. Please note  that NO REFILLS for any discharge medications will be authorized once you are discharged, as it is imperative that you return to your primary care physician (or establish a relationship with a primary care physician if you do not have one) for your post hospital discharge needs so that they can reassess your need for medications and monitor your lab values.  Time coordinating discharge: 40 minutes  SIGNED:  Pamella Pert, MD, PhD 07/28/2022, 8:26 AM

## 2022-07-28 NOTE — Plan of Care (Signed)
  Problem: Clinical Measurements: Goal: Respiratory complications will improve Outcome: Progressing Goal: Cardiovascular complication will be avoided Outcome: Progressing   Problem: Nutrition: Goal: Adequate nutrition will be maintained Outcome: Progressing   Problem: Elimination: Goal: Will not experience complications related to urinary retention Outcome: Progressing   Problem: Pain Managment: Goal: General experience of comfort will improve Outcome: Progressing   

## 2022-07-28 NOTE — Progress Notes (Addendum)
Attempted to call report to Nurse at [facility]. Per staff, Nurse will call me back soon. AVS printed, PIV removed, and all belongings at bedside.   Gave report to Nurse Howard,LPN.
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
## 2022-07-28 NOTE — TOC Transition Note (Signed)
Transition of Care [facility]) - CM/SW Discharge Note   Patient Details  Name: Billy Smith MRN: [id number] Date of Birth: 07/05/1933  [facility]SW Contact:  Carley Hammed, LCSWA Phone Number: 07/28/2022, 10:17 AM   Clinical Narrative:    Pt to be transported to [facility] via [facility]. Nurse to call report to [phone].   Final next level of care: Skilled Nursing Facility Barriers to Discharge: Barriers Resolved   Patient Goals and CMS Choice        Discharge Placement              Patient chooses bed at: [facility], [place] Patient to be transferred to facility by: [facility] Name of family member notified: [name] Patient and family notified of of transfer: 07/28/22  Discharge Plan and Services                                     Social Determinants of Health (SDOH) Interventions Food Insecurity Interventions: Intervention Not Indicated Housing Interventions: Intervention Not Indicated Transportation Interventions: Intervention Not Indicated Utilities Interventions: Intervention Not Indicated   Readmission Risk Interventions     No data to display

## 2022-07-31 DIAGNOSIS — J9601 Acute respiratory failure with hypoxia: Secondary | ICD-10-CM | POA: Diagnosis not present

## 2022-07-31 DIAGNOSIS — M6281 Muscle weakness (generalized): Secondary | ICD-10-CM | POA: Diagnosis not present

## 2022-07-31 DIAGNOSIS — R1312 Dysphagia, oropharyngeal phase: Secondary | ICD-10-CM | POA: Diagnosis not present

## 2022-07-31 DIAGNOSIS — J449 Chronic obstructive pulmonary disease, unspecified: Secondary | ICD-10-CM | POA: Diagnosis not present

## 2022-07-31 DIAGNOSIS — R279 Unspecified lack of coordination: Secondary | ICD-10-CM | POA: Diagnosis not present

## 2022-07-31 DIAGNOSIS — J189 Pneumonia, unspecified organism: Secondary | ICD-10-CM | POA: Diagnosis not present

## 2022-07-31 DIAGNOSIS — I5033 Acute on chronic diastolic (congestive) heart failure: Secondary | ICD-10-CM | POA: Diagnosis not present

## 2022-07-31 DIAGNOSIS — R2689 Other abnormalities of gait and mobility: Secondary | ICD-10-CM | POA: Diagnosis not present

## 2022-08-02 DIAGNOSIS — R5381 Other malaise: Secondary | ICD-10-CM | POA: Diagnosis not present

## 2022-08-02 DIAGNOSIS — J189 Pneumonia, unspecified organism: Secondary | ICD-10-CM | POA: Diagnosis not present

## 2022-08-02 DIAGNOSIS — I5033 Acute on chronic diastolic (congestive) heart failure: Secondary | ICD-10-CM | POA: Diagnosis not present

## 2022-08-02 DIAGNOSIS — J9621 Acute and chronic respiratory failure with hypoxia: Secondary | ICD-10-CM | POA: Diagnosis not present

## 2022-08-04 DIAGNOSIS — G250 Essential tremor: Secondary | ICD-10-CM | POA: Diagnosis not present

## 2022-08-04 DIAGNOSIS — I5033 Acute on chronic diastolic (congestive) heart failure: Secondary | ICD-10-CM | POA: Diagnosis not present

## 2022-08-04 DIAGNOSIS — E875 Hyperkalemia: Secondary | ICD-10-CM | POA: Diagnosis not present

## 2022-08-04 DIAGNOSIS — R946 Abnormal results of thyroid function studies: Secondary | ICD-10-CM | POA: Diagnosis not present

## 2022-08-08 DIAGNOSIS — R0602 Shortness of breath: Secondary | ICD-10-CM | POA: Diagnosis not present

## 2022-08-21 DIAGNOSIS — J449 Chronic obstructive pulmonary disease, unspecified: Secondary | ICD-10-CM | POA: Diagnosis not present

## 2022-08-21 DIAGNOSIS — J9601 Acute respiratory failure with hypoxia: Secondary | ICD-10-CM | POA: Diagnosis not present

## 2022-08-21 DIAGNOSIS — J189 Pneumonia, unspecified organism: Secondary | ICD-10-CM | POA: Diagnosis not present

## 2022-08-21 DIAGNOSIS — R1312 Dysphagia, oropharyngeal phase: Secondary | ICD-10-CM | POA: Diagnosis not present

## 2022-08-21 DIAGNOSIS — I5033 Acute on chronic diastolic (congestive) heart failure: Secondary | ICD-10-CM | POA: Diagnosis not present

## 2022-08-21 DIAGNOSIS — M6281 Muscle weakness (generalized): Secondary | ICD-10-CM | POA: Diagnosis not present

## 2022-08-21 DIAGNOSIS — R2689 Other abnormalities of gait and mobility: Secondary | ICD-10-CM | POA: Diagnosis not present

## 2022-08-21 DIAGNOSIS — R279 Unspecified lack of coordination: Secondary | ICD-10-CM | POA: Diagnosis not present

## 2022-09-21 DEATH — deceased
